# Patient Record
Sex: Female | Born: 1940 | Race: White | Hispanic: No | Marital: Married | Smoking: Former smoker
Health system: Southern US, Community
[De-identification: ages and names within clinical notes are randomized; demographics above are authoritative.]

## PROBLEM LIST (undated history)

## (undated) DIAGNOSIS — I639 Cerebral infarction, unspecified: Secondary | ICD-10-CM

## (undated) DIAGNOSIS — M79671 Pain in right foot: Secondary | ICD-10-CM

## (undated) DIAGNOSIS — C50919 Malignant neoplasm of unspecified site of unspecified female breast: Secondary | ICD-10-CM

## (undated) HISTORY — PX: CATARACT EXTRACTION: SUR2

## (undated) HISTORY — PX: BREAST BIOPSY: SHX20

## (undated) HISTORY — PX: EYE SURGERY: SHX253

## (undated) HISTORY — DX: Pain in right foot: M79.671

---

## 2007-05-10 ENCOUNTER — Ambulatory Visit: Payer: Self-pay | Admitting: Family Medicine

## 2010-09-25 ENCOUNTER — Ambulatory Visit (HOSPITAL_COMMUNITY): Admission: RE | Admit: 2010-09-25 | Discharge: 2010-09-25 | Payer: Self-pay | Admitting: Ophthalmology

## 2010-10-29 ENCOUNTER — Ambulatory Visit (HOSPITAL_COMMUNITY): Admission: RE | Admit: 2010-10-29 | Discharge: 2010-10-29 | Payer: Self-pay | Admitting: Ophthalmology

## 2011-02-18 LAB — BASIC METABOLIC PANEL
BUN: 9 mg/dL (ref 6–23)
Chloride: 99 mEq/L (ref 96–112)
Creatinine, Ser: 0.96 mg/dL (ref 0.4–1.2)
GFR calc Af Amer: >60 mL/min (ref >60)
GFR calc non Af Amer: 58 mL/min — ABNORMAL LOW (ref >60)
Potassium: 4.1 mEq/L (ref 3.5–5.1)

## 2011-02-18 LAB — HEMOGLOBIN AND HEMATOCRIT, BLOOD
HCT: 42.6 % (ref 36.0–46.0)
Hemoglobin: 14.7 g/dL (ref 12.0–15.0)

## 2013-10-07 DIAGNOSIS — M79671 Pain in right foot: Secondary | ICD-10-CM

## 2013-10-07 HISTORY — DX: Pain in right foot: M79.671

## 2013-11-06 ENCOUNTER — Other Ambulatory Visit: Payer: Self-pay | Admitting: Vascular Surgery

## 2013-11-06 DIAGNOSIS — M79609 Pain in unspecified limb: Secondary | ICD-10-CM

## 2013-11-07 ENCOUNTER — Encounter: Payer: Self-pay | Admitting: Vascular Surgery

## 2013-12-11 ENCOUNTER — Encounter: Payer: Self-pay | Admitting: Vascular Surgery

## 2013-12-12 ENCOUNTER — Encounter (HOSPITAL_COMMUNITY): Payer: Self-pay

## 2013-12-12 ENCOUNTER — Encounter: Payer: Self-pay | Admitting: Vascular Surgery

## 2014-01-02 ENCOUNTER — Encounter: Payer: Self-pay | Admitting: Vascular Surgery

## 2014-01-02 ENCOUNTER — Encounter (HOSPITAL_COMMUNITY): Payer: Self-pay

## 2015-05-22 ENCOUNTER — Other Ambulatory Visit (HOSPITAL_COMMUNITY): Payer: Self-pay | Admitting: Family Medicine

## 2015-05-22 ENCOUNTER — Ambulatory Visit (HOSPITAL_COMMUNITY)
Admission: RE | Admit: 2015-05-22 | Discharge: 2015-05-22 | Disposition: A | Discharge status: Home / Self Care | Payer: Medicare Other | Source: Ambulatory Visit | Attending: Vascular Surgery | Admitting: Vascular Surgery

## 2015-05-22 DIAGNOSIS — I749 Embolism and thrombosis of unspecified artery: Secondary | ICD-10-CM

## 2015-05-22 DIAGNOSIS — I7090 Unspecified atherosclerosis: Secondary | ICD-10-CM

## 2015-05-22 DIAGNOSIS — I70202 Unspecified atherosclerosis of native arteries of extremities, left leg: Secondary | ICD-10-CM | POA: Diagnosis not present

## 2015-09-06 ENCOUNTER — Encounter: Payer: Self-pay | Admitting: Vascular Surgery

## 2015-09-10 ENCOUNTER — Ambulatory Visit (INDEPENDENT_AMBULATORY_CARE_PROVIDER_SITE_OTHER): Payer: Medicare Other | Admitting: Vascular Surgery

## 2015-09-10 ENCOUNTER — Encounter: Payer: Self-pay | Admitting: Vascular Surgery

## 2015-09-10 VITALS — BP 131/67 | HR 65 | Ht 67.5 in | Wt 134.7 lb

## 2015-09-10 DIAGNOSIS — I739 Peripheral vascular disease, unspecified: Secondary | ICD-10-CM | POA: Diagnosis not present

## 2015-09-10 NOTE — Progress Notes (Signed)
Patient name: Jessica Harrell MRN: [id number] DOB: 1941/04/13 Sex: female   Reason for referral:  Chief Complaint  Patient presents with  . New Evaluation    eval pvd    HISTORY OF PRESENT ILLNESS: The patient is an active 74 year old female who presents today for evaluation of pain in her left foot. She initially thought that she may have gout. This was felt not to be the case. She had pain in her left second toe. Was treated with the Pletal and reports that this pain in her left second toe is completely resolved. She specifically denies any claudication type symptoms. Has no discomfort with walking. Has no history of lower extremity tissue loss. Denies cardiac or cerebrovascular symptoms. She does have a long smoking history.  Past Medical History  Diagnosis Date  . Right foot pain Nov. 2014    History reviewed. No pertinent past surgical history.  Social History   Social History  . Marital Status: Married    Spouse Name: N/A  . Number of Children: N/A  . Years of Education: N/A   Occupational History  . Not on file.   Social History Main Topics  . Smoking status: Current Every Day Smoker -- 1.00 packs/day for 58 years    Types: Cigarettes  . Smokeless tobacco: Never Used  . Alcohol Use: 4.2 oz/week    7 Cans of beer per week  . Drug Use: No  . Sexual Activity: Not on file   Other Topics Concern  . Not on file   Social History Narrative    Family History  Problem Relation Age of Onset  . Cancer Mother   . Hypertension Mother   . Varicose Veins Mother   . Cancer Sister     Allergies as of 09/10/2015  . (Not on File)    Current Outpatient Prescriptions on File Prior to Visit  Medication Sig Dispense Refill  . clopidogrel (PLAVIX) 75 MG tablet Take 75 mg by mouth daily with breakfast.     No current facility-administered medications on file prior to visit.     REVIEW OF SYSTEMS:  Positives indicated with an "X"  CARDIOVASCULAR:  [ ]  chest pain    [ ]  chest pressure   [ ]  palpitations   [ ]  orthopnea   [ ]  dyspnea on exertion   [ ]  claudication   [ ]  rest pain   [ ]  DVT   [ ]  phlebitis PULMONARY:   [ ]  productive cough   [ ]  asthma   [ ]  wheezing NEUROLOGIC:   [ ]  weakness  [ ]  paresthesias  [ ]  aphasia  [ ]  amaurosis  [ ]  dizziness HEMATOLOGIC:   [ ]  bleeding problems   [ ]  clotting disorders MUSCULOSKELETAL:  [ ]  joint pain   [ ]  joint swelling GASTROINTESTINAL: [ ]   blood in stool  [ ]   hematemesis GENITOURINARY:  [ ]   dysuria  [ ]   hematuria PSYCHIATRIC:  [ ]  history of major depression INTEGUMENTARY:  [ ]  rashes  [ ]  ulcers CONSTITUTIONAL:  [ ]  fever   [ ]  chills  PHYSICAL EXAMINATION:  General: The patient is a well-nourished female, in no acute distress. Vital signs are BP 131/67 mmHg  Pulse 65  Ht 5' 7.5" (1.715 m)  Wt 134 lb 11.2 oz (61.1 kg)  BMI 20.77 kg/m2  SpO2 95% Pulmonary: There is a good air exchange bilaterally without wheezing or rales. Abdomen: Soft and non-tender with normal  pitch bowel sounds. Musculoskeletal: There are no major deformities.  There is no significant extremity pain. Neurologic: No focal weakness or paresthesias are detected, Skin: There are no ulcer or rashes noted. Psychiatric: The patient has normal affect. Cardiovascular: There is a regular rate and rhythm without significant murmur appreciated. Pulse status: 2+ radial and 2+ femoral pulses. Absent popliteal and distal pulses bilaterally Carotid arteries without bruits bilaterally  [facility] Vascular Lab Studies:  Noninvasive studies from our office on 05/22/2015 revealed partially occlusive plaque in her left common and superficial femoral artery. She had triphasic waveforms at the common femoral level and monophasic at the SFA and distal level.  Impression and Plan:  Bilateral superficial femoral artery occlusive disease. She has no claudication symptoms and is completely resolved pain in her left foot. It is unclear as to whether or  not this was related to her trouble insufficiency. Certainly would not recommend any further follow-up since she is asymptomatic. Did review symptoms of arterial occlusive disease with patient and will notify should this occur. Otherwise we'll see her again on as-needed basis    Bryna Razavi [facility] Office: [phone]

## 2018-04-04 ENCOUNTER — Other Ambulatory Visit: Payer: Self-pay | Admitting: Family Medicine

## 2018-04-04 DIAGNOSIS — N6320 Unspecified lump in the left breast, unspecified quadrant: Secondary | ICD-10-CM

## 2018-04-07 ENCOUNTER — Ambulatory Visit
Admission: RE | Admit: 2018-04-07 | Discharge: 2018-04-07 | Disposition: A | Discharge status: Home / Self Care | Payer: Medicare Other | Source: Ambulatory Visit | Attending: Family Medicine | Admitting: Family Medicine

## 2018-04-07 ENCOUNTER — Other Ambulatory Visit: Payer: Self-pay | Admitting: Family Medicine

## 2018-04-07 DIAGNOSIS — N6320 Unspecified lump in the left breast, unspecified quadrant: Secondary | ICD-10-CM

## 2018-04-07 DIAGNOSIS — N6489 Other specified disorders of breast: Secondary | ICD-10-CM

## 2018-04-12 ENCOUNTER — Ambulatory Visit
Admission: RE | Admit: 2018-04-12 | Discharge: 2018-04-12 | Disposition: A | Discharge status: Home / Self Care | Payer: Medicare Other | Source: Ambulatory Visit | Attending: Family Medicine | Admitting: Family Medicine

## 2018-04-12 ENCOUNTER — Other Ambulatory Visit: Payer: Self-pay | Admitting: Family Medicine

## 2018-04-12 DIAGNOSIS — N6489 Other specified disorders of breast: Secondary | ICD-10-CM

## 2018-04-12 DIAGNOSIS — N6320 Unspecified lump in the left breast, unspecified quadrant: Secondary | ICD-10-CM

## 2018-04-18 ENCOUNTER — Other Ambulatory Visit: Payer: Self-pay | Admitting: Surgery

## 2018-04-19 ENCOUNTER — Encounter: Payer: Self-pay | Admitting: Genetics

## 2018-04-19 ENCOUNTER — Encounter: Payer: Self-pay | Admitting: Hematology and Oncology

## 2018-04-19 ENCOUNTER — Other Ambulatory Visit: Payer: Self-pay | Admitting: Surgery

## 2018-04-19 ENCOUNTER — Telehealth: Payer: Self-pay | Admitting: Hematology and Oncology

## 2018-04-19 DIAGNOSIS — C50912 Malignant neoplasm of unspecified site of left female breast: Secondary | ICD-10-CM

## 2018-04-19 NOTE — Telephone Encounter (Signed)
Referral received from Dr. Ninfa Linden at [facility] for breast cancer. Pt has been scheduled to see Dr. Lindi Adie on 3/21 at 345pm. Pt aware to arrive 30 minutes early. Letter mailed.

## 2018-04-20 ENCOUNTER — Encounter: Payer: Self-pay | Admitting: Radiation Oncology

## 2018-04-20 NOTE — Progress Notes (Signed)
Location of Breast Cancer: Left Breast  Histology per Pathology Report:  04/12/18 Diagnosis 1. Breast, left, needle core biopsy, 8 o'clock, [place] - INVASIVE DUCTAL CARCINOMA WITH EXTRACELLULAR MUCIN. 2. Breast, left, needle core biopsy, lateral calcs, stereo - COMPLEX SCLEROSING LESION WITH USUAL DUCTAL HYPERPLASIA AND CALCIFICATION. - FIBROCYSTIC CHANGES WITH USUAL DUCTAL HYPERPLASIA. - NO MALIGNANCY IDENTIFIED.  Receptor Status: ER(100%), PR (5%), Her2-neu (NEG), Ki-(15%)  Did patient present with symptoms or was this found on screening mammography?: She felt the mass in her left breast approximately 6 weeks ago. She has never had a mammogram.   Past/Anticipated interventions by surgeon, if any: Dr. Ninfa Linden  Past/Anticipated interventions by medical oncology, if any:  Dr. Lindi Adie 04/26/18  Lymphedema issues, if any: N/A  Pain issues, if any:  Reports bruising and tenderness at biopsy site  SAFETY ISSUES:  Prior radiation? no  Pacemaker/ICD? No  Possible current pregnancy? No  Is the patient on methotrexate? No  Current Complaints / other details:   04/27/18 Bilateral Breast MRI    Malmfelt, Stephani Police, RN 04/20/2018,2:19 PM

## 2018-04-26 ENCOUNTER — Telehealth: Payer: Self-pay | Admitting: Hematology and Oncology

## 2018-04-26 ENCOUNTER — Inpatient Hospital Stay: Payer: Medicare Other | Attending: Hematology and Oncology | Admitting: Hematology and Oncology

## 2018-04-26 ENCOUNTER — Ambulatory Visit
Admission: RE | Admit: 2018-04-26 | Discharge: 2018-04-26 | Disposition: A | Discharge status: Home / Self Care | Payer: Medicare Other | Source: Ambulatory Visit | Attending: Radiation Oncology | Admitting: Radiation Oncology

## 2018-04-26 ENCOUNTER — Encounter: Payer: Self-pay | Admitting: Radiation Oncology

## 2018-04-26 ENCOUNTER — Other Ambulatory Visit: Payer: Self-pay

## 2018-04-26 VITALS — BP 190/74 | HR 64 | Temp 97.6°F | Resp 18 | Wt 143.6 lb

## 2018-04-26 DIAGNOSIS — Z79899 Other long term (current) drug therapy: Secondary | ICD-10-CM | POA: Insufficient documentation

## 2018-04-26 DIAGNOSIS — C50312 Malignant neoplasm of lower-inner quadrant of left female breast: Secondary | ICD-10-CM

## 2018-04-26 DIAGNOSIS — I10 Essential (primary) hypertension: Secondary | ICD-10-CM | POA: Diagnosis not present

## 2018-04-26 DIAGNOSIS — Z809 Family history of malignant neoplasm, unspecified: Secondary | ICD-10-CM | POA: Diagnosis not present

## 2018-04-26 DIAGNOSIS — Z170 Estrogen receptor positive status [ER+]: Secondary | ICD-10-CM | POA: Insufficient documentation

## 2018-04-26 DIAGNOSIS — Z7982 Long term (current) use of aspirin: Secondary | ICD-10-CM | POA: Insufficient documentation

## 2018-04-26 DIAGNOSIS — F17210 Nicotine dependence, cigarettes, uncomplicated: Secondary | ICD-10-CM | POA: Insufficient documentation

## 2018-04-26 DIAGNOSIS — Z79811 Long term (current) use of aromatase inhibitors: Secondary | ICD-10-CM | POA: Insufficient documentation

## 2018-04-26 DIAGNOSIS — Z800 Family history of malignant neoplasm of digestive organs: Secondary | ICD-10-CM | POA: Diagnosis not present

## 2018-04-26 HISTORY — DX: Malignant neoplasm of unspecified site of unspecified female breast: C50.919

## 2018-04-26 MED ORDER — LETROZOLE 2.5 MG PO TABS: 2.5000 mg | ORAL_TABLET | Freq: Every day | ORAL | 3 refills | Status: DC

## 2018-04-26 NOTE — Progress Notes (Signed)
See progress note under physician encounter. 

## 2018-04-26 NOTE — Progress Notes (Signed)
[facility] CONSULT NOTE  Patient Care Team: [name], MD as PCP - General (Family Medicine)  CHIEF COMPLAINTS/PURPOSE OF CONSULTATION:  Newly diagnosed breast cancer  HISTORY OF PRESENTING ILLNESS:  Jessica Harrell 77 y.o. female is here because of recent diagnosis of Left breast cancer.  Patient had palpated a lump in the left breast for a few weeks had reported to her husband.  She then underwent a mammogram and ultrasound and a biopsy which showed that she had invasive ductal carcinoma.  She had an area of asymmetry and distortion which on biopsy came back as complex sclerosing lesion.     I reviewed her records extensively and collaborated the history with the patient.  SUMMARY OF ONCOLOGIC HISTORY:   Malignant neoplasm of lower-inner quadrant of left breast in female, estrogen receptor positive ([place])   04/12/2018 Initial Diagnosis    Palpable mass lower inner quadrant left breast approximately measures 6 cm, 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, ER 100%, PR 5%, HER-2 negative ratio 1.23, Ki-67 15% 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; stage II a clinical stage AJCC 8      MEDICAL HISTORY:  Past Medical History:  Diagnosis Date  . Breast cancer ([place])   . Right foot pain Nov. 2014    SURGICAL HISTORY: Past Surgical History:  Procedure Laterality Date  . BREAST BIOPSY    . CATARACT EXTRACTION     both eyes  . EYE SURGERY Left     SOCIAL HISTORY: Social History   Socioeconomic History  . Marital status: Married    Spouse name: Not on file  . Number of children: Not on file  . Years of education: Not on file  . Highest education level: Not on file  Occupational History    Comment: retired  Scientific laboratory technician  . Financial resource strain: Not on file  . Food insecurity:    Worry: Not on file    Inability: Not on file  . Transportation needs:    Medical: Not on file    Non-medical: Not on file  Tobacco Use   . Smoking status: Current Every Day Smoker    Packs/day: 0.50    Years: 58.00    Pack years: 29.00    Types: Cigarettes  . Smokeless tobacco: Never Used  Substance and Sexual Activity  . Alcohol use: Yes    Alcohol/week: 4.2 oz    Types: 7 Cans of beer per week  . Drug use: No  . Sexual activity: Not Currently  Lifestyle  . Physical activity:    Days per week: Not on file    Minutes per session: Not on file  . Stress: Not on file  Relationships  . Social connections:    Talks on phone: Not on file    Gets together: Not on file    Attends religious service: Not on file    Active member of club or organization: Not on file    Attends meetings of clubs or organizations: Not on file    Relationship status: Not on file  . Intimate partner violence:    Fear of current or ex partner: Not on file    Emotionally abused: Not on file    Physically abused: Not on file    Forced sexual activity: Not on file  Other Topics Concern  . Not on file  Social History Narrative   Resides in [place]. Has one grown daughter. No grandchildren. No pets.  FAMILY HISTORY: Family History  Problem Relation Age of Onset  . Cancer Mother        esophageal  . Hypertension Mother   . Varicose Veins Mother   . Cancer Sister        non hodgkins  . Cancer Other        unknown    ALLERGIES:  has No Known Allergies.  MEDICATIONS:  Current Outpatient Medications  Medication Sig Dispense Refill  . aspirin EC 81 MG tablet Take 81 mg by mouth daily.    . cholecalciferol (VITAMIN D) 1000 units tablet Take 1,000 Units by mouth daily.    Marland Kitchen letrozole (FEMARA) 2.5 MG tablet Take 1 tablet (2.5 mg total) by mouth daily. 90 tablet 3  . Red Yeast Rice Extract (RED YEAST RICE PO) Take by mouth.     No current facility-administered medications for this visit.     REVIEW OF SYSTEMS:   Constitutional: Denies fevers, chills or abnormal night sweats Eyes: Denies blurriness of vision, double vision or  watery eyes Ears, nose, mouth, throat, and face: Denies mucositis or sore throat Respiratory: Denies cough, dyspnea or wheezes Cardiovascular: Denies palpitation, chest discomfort or lower extremity swelling Gastrointestinal:  Denies nausea, heartburn or change in bowel habits Skin: Denies abnormal skin rashes Lymphatics: Denies new lymphadenopathy or easy bruising Neurological:Denies numbness, tingling or new weaknesses Behavioral/Psych: Mood is stable, no new changes  Breast: Large palpable mass in the left breast lower inner quadrant All other systems were reviewed with the patient and are negative.  PHYSICAL EXAMINATION: ECOG PERFORMANCE STATUS: 1 - Symptomatic but completely ambulatory  Vitals:   04/26/18 1558  BP: (!) 152/75  Pulse: 66  Resp: 17  Temp: 98.1 F (36.7 C)  SpO2: 98%   Filed Weights   04/26/18 1558  Weight: 144 lb 14.4 oz (65.7 kg)    GENERAL:alert, no distress and comfortable SKIN: skin color, texture, turgor are normal, no rashes or significant lesions EYES: normal, conjunctiva are pink and non-injected, sclera clear OROPHARYNX:no exudate, no erythema and lips, buccal mucosa, and tongue normal  NECK: supple, thyroid normal size, non-tender, without nodularity LYMPH:  no palpable lymphadenopathy in the cervical, axillary or inguinal LUNGS: clear to auscultation and percussion with normal breathing effort HEART: regular rate & rhythm and no murmurs and no lower extremity edema ABDOMEN:abdomen soft, non-tender and normal bowel sounds Musculoskeletal:no cyanosis of digits and no clubbing  PSYCH: alert & oriented x 3 with fluent speech NEURO: no focal motor/sensory deficits BREAST: Very large left breast mass lower inner quadrant. No palpable axillary or supraclavicular lymphadenopathy (exam performed in the presence of a chaperone)   LABORATORY DATA:  I have reviewed the data as listed Lab Results  Component Value Date   HGB 14.7 09/22/2010   HCT 42.6  09/22/2010   Lab Results  Component Value Date   NA 133 (L) 09/22/2010   K 4.1 09/22/2010   CL 99 09/22/2010   CO2 28 09/22/2010    RADIOGRAPHIC STUDIES: I have personally reviewed the radiological reports and agreed with the findings in the report.  ASSESSMENT AND PLAN:  Malignant neoplasm of lower-inner quadrant of left breast in female, estrogen receptor positive ([facility]) 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; stage II a clinical stage AJCC 8  Pathology and radiology counseling: Discussed with the  patient, the details of pathology including the type of breast cancer,the clinical staging, the significance of ER, PR and HER-2/neu receptors and the implications for treatment. After reviewing the pathology in detail, we proceeded to discuss the different treatment options between surgery, radiation, chemotherapy, antiestrogen therapies.  Recommendation: 1.  Neoadjuvant antiestrogen therapy with letrozole 2.5 mg daily for 6 months 2. breast MRI now as well as after completion of neoadjuvant antiestrogen's  3.  If the tumor does not respond to neoadjuvant antiestrogen therapy, then we can switch her to chemotherapy. 4.  Followed by mastectomy most likely given the size of the tumor versus lumpectomy 5.  Adjuvant radiation therapy  6.  Adjuvant antiestrogen therapy with letrozole x7 years   Return to clinic in 1 month for toxicity check and follow-up     All questions were answered. The patient knows to call the clinic with any problems, questions or concerns.    Harriette Ohara, MD 04/26/18

## 2018-04-26 NOTE — Assessment & Plan Note (Addendum)
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; stage II a clinical stage AJCC 8  Pathology and radiology counseling: Discussed with the patient, the details of pathology including the type of breast cancer,the clinical staging, the significance of ER, PR and HER-2/neu receptors and the implications for treatment. After reviewing the pathology in detail, we proceeded to discuss the different treatment options between surgery, radiation, chemotherapy, antiestrogen therapies.  Recommendation: 1.  Neoadjuvant antiestrogen therapy with letrozole 2.5 mg daily for 6 months 2. breast MRI now as well as after completion of neoadjuvant antiestrogen's  3.  If the tumor does not respond to neoadjuvant antiestrogen therapy, then we can switch her to chemotherapy. 4.  Followed by mastectomy most likely given the size of the tumor versus lumpectomy 5.  Adjuvant radiation therapy  6.  Adjuvant antiestrogen therapy with letrozole x7 years   Return to clinic in 1 month for toxicity check and follow-up

## 2018-04-26 NOTE — Telephone Encounter (Signed)
Appointments scheduled AVS/Calendar printed per 5/21 los °

## 2018-04-26 NOTE — Progress Notes (Signed)
Radiation Oncology         [phone] ________________________________  Initial Outpatient Consultation  Name: Jessica Harrell MRN: [id number]  Date: 04/26/2018  DOB: 1941-02-20  CC:[name], MD  Coralie Keens, MD   REFERRING PHYSICIAN: Coralie Keens, MD  DIAGNOSIS:    ICD-10-CM   1. Malignant neoplasm of lower-inner quadrant of left breast in female, estrogen receptor positive ([place]) C50.312    Z17.0    Stage T3N0M0 (pending MRI staging) Left Breast LIQ Invasive Ductal Carcinoma, ER+ / PR+ / Her2-, Grade I-II  Cancer Staging Malignant neoplasm of lower-inner quadrant of left breast in female, estrogen receptor positive ([place]) Staging form: Breast, AJCC 8th Edition - Clinical: Stage IIA (cT3, cN0, cM0, G2, ER+, PR+, HER2-) - Signed by Eppie Gibson, MD on 04/27/2018    CHIEF COMPLAINT: Here to discuss management of left breast cancer  HISTORY OF PRESENT ILLNESS::Jessica Harrell is a 77 y.o. female who self palpated a mass in the left breast approximately 6 weeks ago. Diagnostic mammogram on 04/07/18 showed highly suspicious 6-8 cm mass involving the lower inner quadrant of the left breast with associated skin involvement. The size of the mass is likely underestimated on ultrasound. Indeterminate architectural distortion associated with microcalcifications in the lower outer quadrant of the left breast at middle depth without sonographic correlate. Benign 6 cm lipoma in the upper outer quadrant of the left breast. No pathologic left axillary lymphadenopathy. No mammographic evidence of malignancy involving the right breast. The patient has no prior mammograms Biopsy of the 8 o'clock position on 04/12/18 showed invasive ductal carcinoma with extracellular mucin with characteristics as described above in the diagnosis. Biopsy of the lateral calcificaions showed complex sclerosing lesion with usual ductal hyperplasia and calcification but no malignancy identified. Receptor status is  ER 100%, PR 5%, Her2 negative, and Ki-67 15%.  The patient presents today complaining of bruising and tenderness to the biopsy site. She denies any new chest pains, bladder/bowel problems, skin changes, or palpable masses in any other areas of the body. The patient presents with her husband and her daughter to discuss the role of radiation therapy as part of her disease management.  PREVIOUS RADIATION THERAPY: No  PAST MEDICAL HISTORY:  has a past medical history of Breast cancer ([place]) and Right foot pain (Nov. 2014).    PAST SURGICAL HISTORY: Past Surgical History:  Procedure Laterality Date  . BREAST BIOPSY    . CATARACT EXTRACTION     both eyes  . EYE SURGERY Left     FAMILY HISTORY: family history includes Cancer in her mother, other, and sister; Hypertension in her mother; Varicose Veins in her mother.  SOCIAL HISTORY:  reports that she has been smoking cigarettes.  She has a 29.00 pack-year smoking history. She has never used smokeless tobacco. She reports that she drinks about 4.2 oz of alcohol per week. She reports that she does not use drugs.  ALLERGIES: Patient has no known allergies.  MEDICATIONS:  Current Outpatient Medications  Medication Sig Dispense Refill  . aspirin EC 81 MG tablet Take 81 mg by mouth daily.    . cholecalciferol (VITAMIN D) 1000 units tablet Take 1,000 Units by mouth daily.    . Red Yeast Rice Extract (RED YEAST RICE PO) Take by mouth.    . letrozole (FEMARA) 2.5 MG tablet Take 1 tablet (2.5 mg total) by mouth daily. 90 tablet 3   No current facility-administered medications for this encounter.     REVIEW OF  SYSTEMS: A 10+ POINT REVIEW OF SYSTEMS WAS OBTAINED including neurology, dermatology, psychiatry, cardiac, respiratory, lymph, extremities, GI, GU, Musculoskeletal, constitutional, breasts, HEENT.  All pertinent positives are noted in the HPI.  All others are negative.   PHYSICAL EXAM:  weight is 143 lb 9.6 oz (65.1 kg). Her oral temperature is  97.6 F (36.4 C). Her blood pressure is 190/74 (abnormal) and her pulse is 64. Her respiration is 18 and oxygen saturation is 96%.   General: Alert and oriented, in no acute distress HEENT: Head is normocephalic. Extraocular movements are intact. Oropharynx is clear. Neck: Neck is supple, no palpable cervical or supraclavicular lymphadenopathy. Heart: Regular in rate and rhythm with no murmurs, rubs, or gallops. Chest: Clear to auscultation bilaterally, with no rhonchi, wheezes, or rales. Abdomen: Soft, nontender, nondistended, with no rigidity or guarding. Extremities: No cyanosis or edema. Lymphatics: see Neck Exam Skin: No concerning lesions. Musculoskeletal: symmetric strength and muscle tone throughout. Neurologic: Cranial nerves II through XII are grossly intact. No obvious focalities. Speech is fluent. Coordination is intact. Psychiatric: Judgment and insight are intact. Affect is appropriate.  Breasts: Lower inner quadrant left breast mass which is approximately 6 cm in greatest dimension. On palpation it appears somewhat fixed. There is no erosion through the skin. In the upper outer quadrant of the left breast there is a soft, mobile mass that is also approximately 6 cm in dimension. This is consistent with what may be the lipoma found on imaging. No palpable axillary lymphadenopathy. No other palpable masses appreciated in the breasts or axillae.   ECOG = 1  0 - Asymptomatic (Fully active, able to carry on all predisease activities without restriction)  1 - Symptomatic but completely ambulatory (Restricted in physically strenuous activity but ambulatory and able to carry out work of a light or sedentary nature. For example, light housework, office work)  2 - Symptomatic, <50% in bed during the day (Ambulatory and capable of all self care but unable to carry out any work activities. Up and about more than 50% of waking hours)  3 - Symptomatic, >50% in bed, but not bedbound (Capable  of only limited self-care, confined to bed or chair 50% or more of waking hours)  4 - Bedbound (Completely disabled. Cannot carry on any self-care. Totally confined to bed or chair)  5 - Death   Eustace Pen MM, Creech RH, Tormey DC, et al. [phone]). "Toxicity and response criteria of the [facility]". [place] Oncol. 5 (6): 649-55   LABORATORY DATA:  Lab Results  Component Value Date   HGB 14.7 09/22/2010   HCT 42.6 09/22/2010   CMP     Component Value Date/Time   NA 133 (L) 09/22/2010 0917   K 4.1 09/22/2010 0917   CL 99 09/22/2010 0917   CO2 28 09/22/2010 0917   GLUCOSE 88 09/22/2010 0917   BUN 9 09/22/2010 0917   CREATININE 0.96 09/22/2010 0917   CALCIUM 9.3 09/22/2010 0917   GFRNONAA 58 (L) 09/22/2010 0917   GFRAA  09/22/2010 0917    >60        The eGFR has been calculated using the MDRD equation. This calculation has not been validated in all clinical situations. eGFR's persistently <60 mL/min signify possible Chronic Kidney Disease.         RADIOGRAPHY: US Breast Ltd Uni Left Inc Axilla  Result Date: 04/07/2018 CLINICAL DATA:  77 year old presenting with a painless palpable lump involving the LOWER INNER LEFT breast which the patient  states she initially noticed approximately 4-5 weeks ago. Annual evaluation, RIGHT breast. This is the patient's initial baseline mammogram. EXAM: DIGITAL DIAGNOSTIC BILATERAL MAMMOGRAM WITH CAD AND TOMO ULTRASOUND LEFT BREAST COMPARISON:  None. ACR Breast Density Category b: There are scattered areas of fibroglandular density. FINDINGS: Tomosynthesis and synthesized full field CC and MLO views of both breasts were obtained. Tomosynthesis and synthesized spot compression tangential view of the area of concern in the LEFT breast and tomosynthesis and synthesized spot-compression CC and MLO views of a separate mammographic finding in the LEFT breast were obtained. Corresponding to the palpable concern is a large mass which  is incompletely imaged on mammography, measuring at least 6 cm, with apparent extension to the skin surface and slight dimpling of the skin. There is a focus of architectural distortion associated with a focal asymmetry and microcalcifications in the LOWER OUTER quadrant at MIDDLE depth which persists on the spot compression images. I do not identify an associated mass in this location. A solitary lymph node is identified in the low LEFT axilla on the MLO view. No findings suspicious for malignancy in the RIGHT breast. Mammographic images were processed with CAD. On physical exam, there is a palpable approximate 6-8 cm mass involving the LOWER INNER QUADRANT of the LEFT breast with discoloration of the overlying skin. There is a an approximate 6 cm fluctuant soft mass in the UPPER OUTER QUADRANT of the LEFT breast. Targeted LEFT breast ultrasound is performed, showing the large mass involving the LOWER INNER QUADRANT centered at the 8 o'clock location, heterogeneous in echotexture, extending up to the skin surface. Due to its large size, accurate measurements are difficult, though it is on the order of at least 6 cm. There are mixed posterior characteristics and there is internal power Doppler flow. An incidental circumscribed lipoma is identified at the 2 o'clock position approximately 4 cm from the nipple, visible in retrospect on the mammogram. The lipoma measures approximately 2.1 x 5.3 x 6.3 cm. There is no sonographic correlate for the architectural distortion in the LOWER OUTER QUADRANT which is located at the inferior and anterior margin of the lipoma. An incidental 3 mm cyst is identified at the 3 o'clock position approximately 3 cm from the nipple. Sonographic evaluation of the LEFT axilla demonstrates no pathologic lymphadenopathy. The lymph node visible on the MLO mammogram in the low LEFT axilla is normal in appearance. IMPRESSION: 1. Highly suspicious large mass involving the LOWER INNER QUADRANT of  the LEFT breast with associated skin involvement. The size of the mass is likely underestimated on ultrasound. The maximum sonographic measurement is approximately 6 cm. 2. Indeterminate architectural distortion associated with microcalcifications in the LOWER OUTER QUADRANT of the LEFT breast at MIDDLE depth without sonographic correlate. 3. Benign 6 cm lipoma in the UPPER OUTER QUADRANT of the LEFT breast. 4. No pathologic LEFT axillary lymphadenopathy. 5. No mammographic evidence of malignancy involving the RIGHT breast. RECOMMENDATION: 1. Ultrasound-guided core needle biopsy of the large mass involving the LOWER INNER QUADRANT of the LEFT breast. 2. Stereotactic tomosynthesis core needle biopsy of the architectural distortion involving the LOWER OUTER QUADRANT of the LEFT breast. The biopsy procedures were discussed with the patient and her questions were answered. She has agreed to proceed and the procedures have been scheduled for Tuesday, May 7 at 7:30 a.m. and will be performed consecutively. I have discussed the findings and recommendations with the patient. Results were also provided in writing at the conclusion of the visit. BI-RADS CATEGORY  5: Highly suggestive of malignancy. Electronically Signed   By: Evangeline Dakin M.D.   On: 04/07/2018 15:57   Mm Diag Breast Tomo Bilateral  Result Date: 04/07/2018 CLINICAL DATA:  77 year old presenting with a painless palpable lump involving the LOWER INNER LEFT breast which the patient states she initially noticed approximately 4-5 weeks ago. Annual evaluation, RIGHT breast. This is the patient's initial baseline mammogram. EXAM: DIGITAL DIAGNOSTIC BILATERAL MAMMOGRAM WITH CAD AND TOMO ULTRASOUND LEFT BREAST COMPARISON:  None. ACR Breast Density Category b: There are scattered areas of fibroglandular density. FINDINGS: Tomosynthesis and synthesized full field CC and MLO views of both breasts were obtained. Tomosynthesis and synthesized spot compression  tangential view of the area of concern in the LEFT breast and tomosynthesis and synthesized spot-compression CC and MLO views of a separate mammographic finding in the LEFT breast were obtained. Corresponding to the palpable concern is a large mass which is incompletely imaged on mammography, measuring at least 6 cm, with apparent extension to the skin surface and slight dimpling of the skin. There is a focus of architectural distortion associated with a focal asymmetry and microcalcifications in the LOWER OUTER quadrant at MIDDLE depth which persists on the spot compression images. I do not identify an associated mass in this location. A solitary lymph node is identified in the low LEFT axilla on the MLO view. No findings suspicious for malignancy in the RIGHT breast. Mammographic images were processed with CAD. On physical exam, there is a palpable approximate 6-8 cm mass involving the LOWER INNER QUADRANT of the LEFT breast with discoloration of the overlying skin. There is a an approximate 6 cm fluctuant soft mass in the UPPER OUTER QUADRANT of the LEFT breast. Targeted LEFT breast ultrasound is performed, showing the large mass involving the LOWER INNER QUADRANT centered at the 8 o'clock location, heterogeneous in echotexture, extending up to the skin surface. Due to its large size, accurate measurements are difficult, though it is on the order of at least 6 cm. There are mixed posterior characteristics and there is internal power Doppler flow. An incidental circumscribed lipoma is identified at the 2 o'clock position approximately 4 cm from the nipple, visible in retrospect on the mammogram. The lipoma measures approximately 2.1 x 5.3 x 6.3 cm. There is no sonographic correlate for the architectural distortion in the LOWER OUTER QUADRANT which is located at the inferior and anterior margin of the lipoma. An incidental 3 mm cyst is identified at the 3 o'clock position approximately 3 cm from the nipple.  Sonographic evaluation of the LEFT axilla demonstrates no pathologic lymphadenopathy. The lymph node visible on the MLO mammogram in the low LEFT axilla is normal in appearance. IMPRESSION: 1. Highly suspicious large mass involving the LOWER INNER QUADRANT of the LEFT breast with associated skin involvement. The size of the mass is likely underestimated on ultrasound. The maximum sonographic measurement is approximately 6 cm. 2. Indeterminate architectural distortion associated with microcalcifications in the LOWER OUTER QUADRANT of the LEFT breast at MIDDLE depth without sonographic correlate. 3. Benign 6 cm lipoma in the UPPER OUTER QUADRANT of the LEFT breast. 4. No pathologic LEFT axillary lymphadenopathy. 5. No mammographic evidence of malignancy involving the RIGHT breast. RECOMMENDATION: 1. Ultrasound-guided core needle biopsy of the large mass involving the LOWER INNER QUADRANT of the LEFT breast. 2. Stereotactic tomosynthesis core needle biopsy of the architectural distortion involving the LOWER OUTER QUADRANT of the LEFT breast. The biopsy procedures were discussed with the patient and her questions were  answered. She has agreed to proceed and the procedures have been scheduled for Tuesday, May 7 at 7:30 a.m. and will be performed consecutively. I have discussed the findings and recommendations with the patient. Results were also provided in writing at the conclusion of the visit. BI-RADS CATEGORY  5: Highly suggestive of malignancy. Electronically Signed   By: Evangeline Dakin M.D.   On: 04/07/2018 15:57   Mm Clip Placement Left  Result Date: 04/12/2018 CLINICAL DATA:  Post biopsy mammogram of the left breast for clip placement. EXAM: DIAGNOSTIC LEFT MAMMOGRAM POST ULTRASOUND AND STEREOTACTIC BIOPSY COMPARISON:  Previous exam(s). FINDINGS: Mammographic images were obtained following ultrasound guided biopsy of of a mass in the lower-inner left breast at 8 o'clock, and stereotactic guided biopsy of an  area of distortion and calcifications in the lateral left breast. The ribbon shaped biopsy marking clip is well positioned at the site of the biopsied mass in the lower-inner left breast. The coil shaped biopsy marking clip is positioned at the site of distortion in the slightly upper outer left breast. IMPRESSION: 1. The ribbon shaped biopsy marking clip is well positioned within the mass at 8 o'clock in the left breast. 2. The coil shaped biopsy marking clip is well positioned at the site of biopsied distortion in the slightly upper outer left breast. Final Assessment: Post Procedure Mammograms for Marker Placement Electronically Signed   By: Ammie Ferrier M.D.   On: 04/12/2018 08:59   Mm Lt Breast Bx W Loc Dev 1st Lesion Image Bx Spec Stereo Guide  Addendum Date: 04/18/2018   ADDENDUM REPORT: 04/13/2018 14:21 ADDENDUM: Pathology revealed GRADE I-II INVASIVE DUCTAL CARCINOMA WITH EXTRACELLULAR MUCIN of the Left breast, 8 o'clock. This was found to be concordant by Dr. Ammie Ferrier. Pathology revealed COMPLEX SCLEROSING LESION WITH USUAL DUCTAL HYPERPLASIA AND CALCIFICATION, FIBROCYSTIC CHANGES WITH USUAL DUCTAL HYPERPLASIA of the Left breast, lateral calcs. This was found to be concordant by Dr. Ammie Ferrier, with excision recommended. Pathology results were discussed with the patient by telephone. The patient reported doing well after the biopsies with tenderness at the sites. Post biopsy instructions and care were reviewed and questions were answered. The patient was encouraged to call [facility] for any additional concerns. Surgical consultation has been arranged with Dr. Nedra Hai at [facility], per patient request, on Apr 18, 2018. Pathology results reported by Terie Purser, RN on 04/13/2018. Electronically Signed   By: Ammie Ferrier M.D.   On: 04/13/2018 14:21   Result Date: 04/18/2018 CLINICAL DATA:  77 year old female presenting for  stereotactic biopsy of distortion with calcifications in the lateral left breast. EXAM: LEFT BREAST STEREOTACTIC CORE NEEDLE BIOPSY COMPARISON:  Previous exams. FINDINGS: The patient and I discussed the procedure of stereotactic-guided biopsy including benefits and alternatives. We discussed the high likelihood of a successful procedure. We discussed the risks of the procedure including infection, bleeding, tissue injury, clip migration, and inadequate sampling. Informed written consent was given. The usual time out protocol was performed immediately prior to the procedure. Using sterile technique and 1% Lidocaine as local anesthetic, under stereotactic guidance, a 9 gauge vacuum assisted device was used to perform core needle biopsy of calcifications in the slightly upper outer left breast using a lateral approach. Specimen radiograph was performed showing faint calcifications within a few core samples. Specimens with calcifications are identified for pathology. Lesion quadrant: Upper outer quadrant At the conclusion of the procedure, a coil shaped tissue marker clip was deployed into  the biopsy cavity. Follow-up 2-view mammogram was performed and dictated separately. IMPRESSION: Stereotactic-guided biopsy of distortion with calcifications in the slightly upper outer left breast. No apparent complications. Electronically Signed: By: Ammie Ferrier M.D. On: 04/12/2018 08:57   [place] Lt Breast Bx W Loc Dev 1st Lesion Img Bx Spec US Guide  Addendum Date: 04/18/2018   ADDENDUM REPORT: 04/13/2018 14:21 ADDENDUM: Pathology revealed GRADE I-II INVASIVE DUCTAL CARCINOMA WITH EXTRACELLULAR MUCIN of the Left breast, 8 o'clock. This was found to be concordant by Dr. Ammie Ferrier. Pathology revealed COMPLEX SCLEROSING LESION WITH USUAL DUCTAL HYPERPLASIA AND CALCIFICATION, FIBROCYSTIC CHANGES WITH USUAL DUCTAL HYPERPLASIA of the Left breast, lateral calcs. This was found to be concordant by Dr. Ammie Ferrier, with  excision recommended. Pathology results were discussed with the patient by telephone. The patient reported doing well after the biopsies with tenderness at the sites. Post biopsy instructions and care were reviewed and questions were answered. The patient was encouraged to call [facility] for any additional concerns. Surgical consultation has been arranged with Dr. Nedra Hai at [facility], per patient request, on Apr 18, 2018. Pathology results reported by Terie Purser, RN on 04/13/2018. Electronically Signed   By: Ammie Ferrier M.D.   On: 04/13/2018 14:21   Result Date: 04/18/2018 CLINICAL DATA:  77 year old female presenting for ultrasound-guided biopsy of a palpable mass in the lower-inner left breast. EXAM: ULTRASOUND GUIDED LEFT BREAST CORE NEEDLE BIOPSY COMPARISON:  Previous exam(s). FINDINGS: I met with the patient and we discussed the procedure of ultrasound-guided biopsy, including benefits and alternatives. We discussed the high likelihood of a successful procedure. We discussed the risks of the procedure, including infection, bleeding, tissue injury, clip migration, and inadequate sampling. Informed written consent was given. The usual time-out protocol was performed immediately prior to the procedure. Lesion quadrant: Lower-inner quadrant Using sterile technique and 1% Lidocaine as local anesthetic, under direct ultrasound visualization, a 14 gauge spring-loaded device was used to perform biopsy of a mass in the lower inner quadrant of the left breast at 8 o'clock using a superolateral approach. At the conclusion of the procedure a ribbon shaped tissue marker clip was deployed into the biopsy cavity. Follow up 2 view mammogram was performed and dictated separately. IMPRESSION: Ultrasound guided biopsy of left breast mass at 8 o'clock. No apparent complications. Electronically Signed: By: Ammie Ferrier M.D. On: 04/12/2018 08:59        IMPRESSION/PLAN: Stage IIA Left Breast LIQ Invasive Ductal Carcinoma, ER+ / PR+ / Her2-, Grade I-II  The patient will meet with Dr. Lindi Adie to discuss the role of chemotherapy and antiestrogens later today. She is scheduled to undergo bilateral breast MRI tomorrow.   It was a pleasure meeting the patient today. We discussed the risks, benefits, and side effects of radiotherapy. The patient's case will be discussed further to determine the best course of treatment, particularly re: her surgical treatment, which will depend upon MRI results.   We discussed the guidelines associated with post-mastectomy and post-lumpectomy radiation. Given the size of the mass, it is likely the patient will proceed with radiation regardless if she proceeds with left lumpectomy or mastectomy.  I will likely recommend radiotherapy to the left breast to reduce her risk of locoregional recurrence by 2/3. We discussed that radiation would take approximately 4-6 weeks to complete and that I would give the patient a few weeks to heal following surgery before starting treatment planning. If chemotherapy were to be given, this would  precede radiotherapy. We spoke about acute effects including skin irritation and fatigue as well as much less common late effects including internal organ injury or irritation. We spoke about the latest technology that is used to minimize the risk of late effects for patients undergoing radiotherapy to the breast or chest wall. No guarantees of treatment were given. The patient is enthusiastic about proceeding with treatment. A consent form was signed and a copy was placed in the patient's medical record. I look forward to participating in the patient's care.  I will await her referral back to me for postoperative follow-up and eventual CT simulation/treatment planning.  I asked the patient today about tobacco use. The patient uses tobacco.  I advised the patient to quit. Services were offered by me today  to aid with cessation. I assessed for the willingness to attempt to quit and provided encouragement and demonstrated willingness to make referrals and/or prescriptions to help the patient attempt to quit. The patient has follow-up with the oncologic team to touch base on their tobacco use and /or cessation efforts.  Over 10 minutes were spent on this issue.  The patient set a quit date for 06/06/18. She will start with the [facility] counseling.  Referral faxed today to the [facility].  __________________________________________   Eppie Gibson, MD   This document serves as a record of services personally performed by Eppie Gibson, MD. It was created on her behalf by Bethann Humble, a trained medical scribe. The creation of this record is based on the scribe's personal observations and the provider's statements to them. This document has been checked and approved by the attending provider.

## 2018-04-27 ENCOUNTER — Ambulatory Visit
Admission: RE | Admit: 2018-04-27 | Discharge: 2018-04-27 | Disposition: A | Discharge status: Home / Self Care | Payer: Medicare Other | Source: Ambulatory Visit | Attending: Surgery | Admitting: Surgery

## 2018-04-27 ENCOUNTER — Encounter: Payer: Self-pay | Admitting: *Deleted

## 2018-04-27 ENCOUNTER — Encounter: Payer: Self-pay | Admitting: Radiation Oncology

## 2018-04-27 DIAGNOSIS — C50912 Malignant neoplasm of unspecified site of left female breast: Secondary | ICD-10-CM

## 2018-04-27 MED ORDER — GADOBENATE DIMEGLUMINE 529 MG/ML IV SOLN
14.0000 mL | Freq: Once | INTRAVENOUS | Status: AC | PRN
  Administered 2018-04-27: 14 mL via INTRAVENOUS

## 2018-05-26 ENCOUNTER — Telehealth: Payer: Self-pay | Admitting: Hematology and Oncology

## 2018-05-26 NOTE — Telephone Encounter (Signed)
Patient called to reschedule  °

## 2018-05-30 ENCOUNTER — Ambulatory Visit: Payer: Medicare Other | Admitting: Hematology and Oncology

## 2018-06-03 ENCOUNTER — Inpatient Hospital Stay: Payer: Medicare Other | Attending: Hematology and Oncology | Admitting: Hematology and Oncology

## 2018-06-03 ENCOUNTER — Telehealth: Payer: Self-pay | Admitting: Hematology and Oncology

## 2018-06-03 DIAGNOSIS — Z170 Estrogen receptor positive status [ER+]: Secondary | ICD-10-CM | POA: Diagnosis not present

## 2018-06-03 DIAGNOSIS — Z79811 Long term (current) use of aromatase inhibitors: Secondary | ICD-10-CM | POA: Insufficient documentation

## 2018-06-03 DIAGNOSIS — C50312 Malignant neoplasm of lower-inner quadrant of left female breast: Secondary | ICD-10-CM | POA: Diagnosis not present

## 2018-06-03 DIAGNOSIS — Z7982 Long term (current) use of aspirin: Secondary | ICD-10-CM | POA: Diagnosis not present

## 2018-06-03 NOTE — Assessment & Plan Note (Signed)
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  Neoadjuvant antiestrogen therapy with letrozole 2.5 mg daily x6 months 2. followed by mastectomy 3. Adjuvant radiation  4.  Followed by antiestrogen therapy x7 years ----------------------------------------------------------------------------- Letrozole toxicities:  Return to clinic in 2 months with mammogram and ultrasound and follow-up.

## 2018-06-03 NOTE — Telephone Encounter (Signed)
Gave patient avs and calendar of upcoming September appointments.  °

## 2018-06-03 NOTE — Progress Notes (Signed)
Patient Care Team: [name], MD as PCP - General (Family Medicine)  DIAGNOSIS:  Encounter Diagnosis  Name Primary?  . 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; stage II a clinical stage AJCC 8      04/27/2018 Cancer Staging    Staging form: Breast, AJCC 8th Edition - Clinical: Stage IIA (cT3, cN0, cM0, G2, ER+, PR+, HER2-) - Signed by Eppie Gibson, MD on 04/27/2018      04/27/2018 -  Anti-estrogen oral therapy    Neoadjuvant antiestrogen therapy with letrozole 2.5 mg daily       CHIEF COMPLIANT: Follow-up on letrozole therapy  INTERVAL HISTORY: Jessica Harrell is a 77 year old with above-mentioned history of large palpable left breast mass who is currently on letrozole therapy since a month ago and is here today to discuss any side effects to treatment.  Overall she appears to be tolerating letrozole extremely well.  She does not have any significant hot flashes arthralgias or myalgias.  REVIEW OF SYSTEMS:   Constitutional: Denies fevers, chills or abnormal weight loss Eyes: Denies blurriness of vision Ears, nose, mouth, throat, and face: Denies mucositis or sore throat Respiratory: Denies cough, dyspnea or wheezes Cardiovascular: Denies palpitation, chest discomfort Gastrointestinal:  Denies nausea, heartburn or change in bowel habits Skin: Denies abnormal skin rashes Lymphatics: Denies new lymphadenopathy or easy bruising Neurological:Denies numbness, tingling or new weaknesses Behavioral/Psych:  Mood is stable, no new changes  Extremities: No lower extremity edema Breast: Large left breast lump All other systems were reviewed with the patient and are negative.  I have reviewed the past medical history, past surgical history, social history and family history with the patient and they are unchanged from previous note.  ALLERGIES:  has No Known Allergies.  MEDICATIONS:  Current Outpatient Medications  Medication Sig Dispense Refill  . aspirin EC 81 MG tablet Take 81 mg by mouth daily.    . cholecalciferol (VITAMIN D) 1000 units tablet Take 1,000 Units by mouth daily.    Marland Kitchen letrozole (FEMARA) 2.5 MG tablet Take 1 tablet (2.5 mg total) by mouth daily. 90 tablet 3  . Red Yeast Rice Extract (RED YEAST RICE PO) Take by mouth.     No current facility-administered medications for this visit.     PHYSICAL EXAMINATION: ECOG PERFORMANCE STATUS: 1 - Symptomatic but completely ambulatory  Vitals:   06/03/18 1138  BP: (!) 145/70  Pulse: 63  Resp: 17  Temp: 97.6 F (36.4 C)  SpO2: 97%   Filed Weights   06/03/18 1138  Weight: 144 lb 1.6 oz (65.4 kg)    GENERAL:alert, no distress and comfortable SKIN: skin color, texture, turgor are normal, no rashes or significant lesions EYES: normal, Conjunctiva are pink and non-injected, sclera clear OROPHARYNX:no exudate, no erythema and lips, buccal mucosa, and tongue normal  NECK: supple, thyroid normal size, non-tender, without nodularity LYMPH:  no palpable lymphadenopathy in the cervical, axillary or inguinal LUNGS: clear to auscultation and percussion with normal breathing effort HEART: regular rate & rhythm and no murmurs and no lower extremity edema  ABDOMEN:abdomen soft, non-tender and normal bowel sounds MUSCULOSKELETAL:no cyanosis of digits and no clubbing  NEURO: alert & oriented x 3 with fluent speech, no focal motor/sensory deficits EXTREMITIES: No lower extremity edema  LABORATORY DATA:  I have reviewed the data as  listed CMP Latest Ref Rng & Units 09/22/2010  Glucose 70 - 99 mg/dL 88  BUN 6 - 23 mg/dL 9  Creatinine 0.4 - 1.2 mg/dL 0.96  Sodium 135 - 145 mEq/L 133(L)  Potassium 3.5 - 5.1 mEq/L 4.1  Chloride 96 - 112 mEq/L 99  CO2 19 - 32 mEq/L 28  Calcium 8.4 - 10.5 mg/dL 9.3    Lab Results  Component Value Date   HGB 14.7 09/22/2010   HCT 42.6 09/22/2010    ASSESSMENT & PLAN:  Malignant neoplasm of lower-inner quadrant of left breast in female, estrogen receptor positive ([facility]) 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  Neoadjuvant antiestrogen therapy with letrozole 2.5 mg daily x6 months 2. followed by mastectomy 3. Adjuvant radiation  4.  Followed by antiestrogen therapy x7 years ----------------------------------------------------------------------------- Letrozole toxicities: Denies any side effects like hot flashes or myalgias. Tolerating it extremely well.  Return to clinic in 2 months with mammogram and ultrasound and follow-up.     Orders Placed This Encounter  Procedures  . MM DIAG BREAST TOMO UNI LEFT    Standing Status:   Future    Standing Expiration Date:   06/04/2019    Order Specific Question:   Reason for Exam (SYMPTOM  OR DIAGNOSIS REQUIRED)    Answer:   Noe-adj anti estrogen therapy eval    Order Specific Question:   Preferred imaging location?    Answer:   [facility]  . US BREAST LTD UNI LEFT INC AXILLA    Standing Status:   Future    Standing Expiration Date:   08/04/2019    Order Specific Question:   Reason for Exam (SYMPTOM  OR DIAGNOSIS REQUIRED)    Answer:   Left breast cancer mid point of anti-estrogen therapy    Order Specific Question:   Preferred imaging location?    Answer:    [facility]   The patient has a good understanding of the overall plan. she agrees with it. she will call with any problems that may develop before the next visit here.   Harriette Ohara, MD 06/03/18

## 2018-08-02 ENCOUNTER — Ambulatory Visit
Admission: RE | Admit: 2018-08-02 | Discharge: 2018-08-02 | Disposition: A | Discharge status: Home / Self Care | Payer: Medicare Other | Source: Ambulatory Visit | Attending: Hematology and Oncology | Admitting: Hematology and Oncology

## 2018-08-02 DIAGNOSIS — C50312 Malignant neoplasm of lower-inner quadrant of left female breast: Secondary | ICD-10-CM

## 2018-08-02 DIAGNOSIS — Z170 Estrogen receptor positive status [ER+]: Principal | ICD-10-CM

## 2018-08-09 ENCOUNTER — Telehealth: Payer: Self-pay | Admitting: Hematology and Oncology

## 2018-08-09 ENCOUNTER — Inpatient Hospital Stay: Payer: Medicare Other | Attending: Hematology and Oncology | Admitting: Hematology and Oncology

## 2018-08-09 DIAGNOSIS — Z79811 Long term (current) use of aromatase inhibitors: Secondary | ICD-10-CM

## 2018-08-09 DIAGNOSIS — C50312 Malignant neoplasm of lower-inner quadrant of left female breast: Secondary | ICD-10-CM | POA: Diagnosis present

## 2018-08-09 DIAGNOSIS — Z170 Estrogen receptor positive status [ER+]: Secondary | ICD-10-CM | POA: Insufficient documentation

## 2018-08-09 NOTE — Assessment & Plan Note (Signed)
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  Neoadjuvant antiestrogen therapy with letrozole 2.5 mg daily x6 months started 04/26/2018 2. followed by mastectomy 3. Adjuvant radiation  4.  Followed by antiestrogen therapy x7 years ----------------------------------------------------------------------------- Letrozole toxicities: Denies any side effects like hot flashes or myalgias. Tolerating it extremely well.  Mammogram and ultrasound 08/02/2018: Slight decrease in the known left breast cancer now measures 5.4 x 2.2 x 5.2 cm previously it was 5.3 x 2.7 x 6.1 cm.  Radiology review: I discussed with the patient that the tumor on the trunk very minimally.  I do not believe that it will shrink significantly even if he use antiestrogen therapy for the full 6 months.  However patient wishes to continue with that.  We will see how it looks like in 3 more months with the breast MRI and follow-up. 

## 2018-08-09 NOTE — Progress Notes (Signed)
Patient Care Team: [name], MD as PCP - General (Family Medicine)  DIAGNOSIS:  Encounter Diagnosis  Name Primary?  . 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  She appears to be tolerating letrozole extremely well.  She had a recent mammogram and ultrasound after being on letrozole for 3 months.  There was a small decrease in the size of the tumor.  REVIEW OF SYSTEMS:   Constitutional: Denies fevers, chills or abnormal weight loss Eyes: Denies blurriness of vision Ears, nose, mouth, throat, and face: Denies mucositis or sore throat Respiratory: Denies cough, dyspnea or wheezes Cardiovascular: Denies palpitation, chest discomfort Gastrointestinal:  Denies nausea, heartburn or change in bowel habits Skin: Denies abnormal skin rashes Lymphatics: Denies new lymphadenopathy or easy bruising Neurological:Denies numbness, tingling or new  weaknesses Behavioral/Psych: Mood is stable, no new changes  Extremities: No lower extremity edema Breast:  denies any pain or lumps or nodules in either breasts All other systems were reviewed with the patient and are negative.  I have reviewed the past medical history, past surgical history, social history and family history with the patient and they are unchanged from previous note.  ALLERGIES:  has No Known Allergies.  MEDICATIONS:  Current Outpatient Medications  Medication Sig Dispense Refill  . aspirin EC 81 MG tablet Take 81 mg by mouth daily.    . cholecalciferol (VITAMIN D) 1000 units tablet Take 1,000 Units by mouth daily.    Marland Kitchen letrozole (FEMARA) 2.5 MG tablet Take 1 tablet (2.5 mg total) by mouth daily. 90 tablet 3  . Red Yeast Rice Extract (RED YEAST RICE PO) Take by mouth.     No current facility-administered medications for this visit.     PHYSICAL EXAMINATION: ECOG PERFORMANCE STATUS: 1 - Symptomatic but completely ambulatory  Vitals:   08/09/18 1007  BP: (!) 141/69  Pulse: (!) 59  Resp: 18  Temp: 97.8 F (36.6 C)  SpO2: 98%   Filed Weights   08/09/18 1007  Weight: 146 lb 11.2 oz (66.5 kg)    GENERAL:alert, no distress and comfortable SKIN: skin color, texture, turgor are normal, no rashes or significant lesions EYES: normal, Conjunctiva are pink and non-injected, sclera clear OROPHARYNX:no exudate, no erythema and lips, buccal mucosa, and tongue normal  NECK: supple, thyroid normal size, non-tender, without nodularity LYMPH:  no palpable lymphadenopathy in the cervical, axillary or inguinal LUNGS: clear to auscultation and percussion with normal breathing effort HEART: regular rate & rhythm and no murmurs and no lower  extremity edema ABDOMEN:abdomen soft, non-tender and normal bowel sounds MUSCULOSKELETAL:no cyanosis of digits and no clubbing  NEURO: alert & oriented x 3 with fluent speech, no focal motor/sensory deficits EXTREMITIES: No lower  extremity edema   LABORATORY DATA:  I have reviewed the data as listed CMP Latest Ref Rng & Units 09/22/2010  Glucose 70 - 99 mg/dL 88  BUN 6 - 23 mg/dL 9  Creatinine 0.4 - 1.2 mg/dL 0.96  Sodium 135 - 145 mEq/L 133(L)  Potassium 3.5 - 5.1 mEq/L 4.1  Chloride 96 - 112 mEq/L 99  CO2 19 - 32 mEq/L 28  Calcium 8.4 - 10.5 mg/dL 9.3    Lab Results  Component Value Date   HGB 14.7 09/22/2010   HCT 42.6 09/22/2010    ASSESSMENT & PLAN:  Malignant neoplasm of lower-inner quadrant of left breast in female, estrogen receptor positive ([facility]) 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  Neoadjuvant antiestrogen therapy with letrozole 2.5 mg daily x6 months started 04/26/2018 2. followed by mastectomy 3. Adjuvant radiation  4.  Followed by antiestrogen therapy x7 years ----------------------------------------------------------------------------- Letrozole toxicities: Denies any side effects like hot flashes or myalgias. Tolerating it extremely well.  Mammogram and ultrasound 08/02/2018: Slight decrease in the known left breast cancer now measures 5.4 x 2.2 x 5.2 cm previously it was 5.3 x 2.7 x 6.1 cm.  Radiology review: I discussed with the patient that the tumor only shrink  minimally.  Patient is still very interested in pursuing a neoadjuvant approach to see if lumpectomy can be performed at a later point. Return to clinic in 3 months with mammogram and ultrasound and follow-up  Orders Placed This Encounter  Procedures  . MM DIAG BREAST TOMO UNI LEFT    Standing Status:   Future    Standing Expiration Date:   08/10/2019    Order Specific Question:   Reason for Exam (SYMPTOM  OR DIAGNOSIS REQUIRED)    Answer:   Neoadj anti  estrogen therapy efficacy    Order Specific Question:   Preferred imaging location?    Answer:   [facility]  . US BREAST LTD UNI LEFT INC AXILLA    Standing Status:   Future    Standing Expiration Date:   10/10/2019    Order Specific Question:   Reason for Exam (SYMPTOM  OR DIAGNOSIS REQUIRED)    Answer:   Neo adj anti estrogen therapy efficacy    Order Specific Question:   Preferred imaging location?    Answer:   [facility]   The patient has a good understanding of the overall plan. she agrees with it. she will call with any problems that may develop before the next visit here.   Harriette Ohara, MD 08/09/18

## 2018-08-09 NOTE — Telephone Encounter (Signed)
Gave patient avs and calendar.  Referrals noted.

## 2018-11-01 NOTE — Progress Notes (Signed)
Patient Care Team: [name], MD as PCP - General (Family Medicine)  DIAGNOSIS:    ICD-10-CM   1. 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 with above-mentioned history of left breast cancer currently neoadjuvant antiestrogen therapy with letrozole. 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 She presents to the clinic today with her husband and daughter to discuss her recent mammogram. She reviewed her medication list with me. She denies any side effects of letrozole or pain.   REVIEW OF SYSTEMS:   Constitutional: Denies fevers, chills or abnormal weight loss Eyes: Denies blurriness of vision Ears, nose, mouth, throat, and face: Denies mucositis or sore throat Respiratory: Denies cough, dyspnea or wheezes Cardiovascular: Denies  palpitation, chest discomfort Gastrointestinal:  Denies nausea, heartburn or change in bowel habits Skin: Denies abnormal skin rashes Lymphatics: Denies new lymphadenopathy or easy bruising Neurological:Denies numbness, tingling or new weaknesses Behavioral/Psych: Mood is stable, no new changes  Extremities: No lower extremity edema Breast: denies any pain or lumps or nodules in either breasts All other systems were reviewed with the patient and are negative.  I have reviewed the past medical history, past surgical history, social history and family history with the patient and they are unchanged from previous note.  ALLERGIES:  has No Known Allergies.  MEDICATIONS:  Current Outpatient Medications  Medication Sig Dispense Refill  . aspirin EC 81 MG tablet Take 81 mg by mouth daily.    . cholecalciferol (VITAMIN D) 1000 units tablet Take 1,000 Units by mouth daily.    Marland Kitchen letrozole (FEMARA) 2.5 MG tablet Take 1 tablet (2.5 mg total) by mouth daily. 90 tablet 3  . Red Yeast Rice Extract (RED YEAST RICE PO) Take by mouth.     No current facility-administered medications for this visit.     PHYSICAL EXAMINATION: ECOG PERFORMANCE STATUS: 1 - Symptomatic but completely ambulatory  Vitals:   11/08/18 1359  BP: (!) 135/51  Pulse: 72  Resp: 16  Temp: (!) 97.5 F (36.4 C)  SpO2: 98%   Filed Weights   11/08/18 1359  Weight: 149 lb 9.6 oz (67.9 kg)    GENERAL:alert, no distress and comfortable SKIN: skin color, texture, turgor are normal, no rashes or significant lesions EYES: normal, Conjunctiva are pink and non-injected, sclera clear OROPHARYNX:no exudate, no  erythema and lips, buccal mucosa, and tongue normal  NECK: supple, thyroid normal size, non-tender, without nodularity LYMPH:  no palpable lymphadenopathy in the cervical, axillary or inguinal LUNGS: clear to auscultation and percussion with normal breathing effort HEART: regular rate & rhythm and no murmurs and no lower  extremity edema ABDOMEN:abdomen soft, non-tender and normal bowel sounds MUSCULOSKELETAL:no cyanosis of digits and no clubbing  NEURO: alert & oriented x 3 with fluent speech, no focal motor/sensory deficits EXTREMITIES: No lower extremity edema  LABORATORY DATA:  I have reviewed the data as listed CMP Latest Ref Rng & Units 09/22/2010  Glucose 70 - 99 mg/dL 88  BUN 6 - 23 mg/dL 9  Creatinine 0.4 - 1.2 mg/dL 0.96  Sodium 135 - 145 mEq/L 133(L)  Potassium 3.5 - 5.1 mEq/L 4.1  Chloride 96 - 112 mEq/L 99  CO2 19 - 32 mEq/L 28  Calcium 8.4 - 10.5 mg/dL 9.3    Lab Results  Component Value Date   HGB 14.7 09/22/2010   HCT 42.6 09/22/2010    ASSESSMENT & PLAN:  Malignant neoplasm of lower-inner quadrant of left breast in female, estrogen receptor positive ([facility]) 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 Tolerating it extremely well.  Mammogram and ultrasound 08/02/2018: Slight decrease in the known left breast cancer now measures 5.4 x 2.2 x 5.2 cm previously it was 5.3 x 2.7 x 6.1 cm.  Mammogram and ultrasound 11/07/2018: Left breast lower inner quadrant 2.2 x 5.1 x 4.6 cm (2.2 x 5.4 x 5.1 cm on 08/02/2018 and approximately 2.7 x 5.3 x 6.0 cm on 04/07/2018).  There is only a subtle decrease in the size of the tumor, we will continue with neoadjuvant antiestrogen therapy for another 3  months and repeat mammogram and ultrasound    Orders Placed This Encounter  Procedures  . US BREAST LTD UNI LEFT INC AXILLA    Standing Status:   Future    Standing Expiration Date:   01/10/2020    Order Specific Question:   Reason for Exam (SYMPTOM  OR DIAGNOSIS REQUIRED)    Answer:   breast cancer on anti estrogen therapy    Order Specific Question:   Preferred imaging location?    Answer:   [facility]  . MM DIAG BREAST TOMO UNI LEFT    Standing Status:   Future    Standing Expiration Date:   11/09/2019    Order Specific Question:   Reason for Exam (SYMPTOM  OR DIAGNOSIS REQUIRED)    Answer:   Breast cancer on anti estrogen therapy    Order Specific Question:   Preferred imaging location?    Answer:   [facility]   The patient has a good understanding of the overall plan. she agrees with it. she will call with any problems that may develop before the next visit here.  Nicholas Lose, MD 11/08/2018   I, Cloyde Reams Dorshimer, am acting as scribe for Nicholas Lose, MD.  I have reviewed the above documentation for accuracy and completeness, and I agree with the above.

## 2018-11-07 ENCOUNTER — Ambulatory Visit
Admission: RE | Admit: 2018-11-07 | Discharge: 2018-11-07 | Disposition: A | Discharge status: Home / Self Care | Payer: Medicare Other | Source: Ambulatory Visit | Attending: Hematology and Oncology | Admitting: Hematology and Oncology

## 2018-11-07 DIAGNOSIS — C50312 Malignant neoplasm of lower-inner quadrant of left female breast: Secondary | ICD-10-CM

## 2018-11-07 DIAGNOSIS — Z170 Estrogen receptor positive status [ER+]: Principal | ICD-10-CM

## 2018-11-08 ENCOUNTER — Telehealth: Payer: Self-pay | Admitting: Hematology and Oncology

## 2018-11-08 ENCOUNTER — Inpatient Hospital Stay: Payer: Medicare Other | Attending: Hematology and Oncology | Admitting: Hematology and Oncology

## 2018-11-08 DIAGNOSIS — Z7982 Long term (current) use of aspirin: Secondary | ICD-10-CM | POA: Insufficient documentation

## 2018-11-08 DIAGNOSIS — Z79811 Long term (current) use of aromatase inhibitors: Secondary | ICD-10-CM | POA: Diagnosis not present

## 2018-11-08 DIAGNOSIS — C50312 Malignant neoplasm of lower-inner quadrant of left female breast: Secondary | ICD-10-CM | POA: Insufficient documentation

## 2018-11-08 DIAGNOSIS — Z170 Estrogen receptor positive status [ER+]: Secondary | ICD-10-CM | POA: Insufficient documentation

## 2018-11-08 DIAGNOSIS — Z9012 Acquired absence of left breast and nipple: Secondary | ICD-10-CM | POA: Diagnosis not present

## 2018-11-08 DIAGNOSIS — Z923 Personal history of irradiation: Secondary | ICD-10-CM | POA: Insufficient documentation

## 2018-11-08 DIAGNOSIS — Z79899 Other long term (current) drug therapy: Secondary | ICD-10-CM | POA: Diagnosis not present

## 2018-11-08 NOTE — Assessment & Plan Note (Signed)
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 Tolerating it extremely well.  Mammogram and ultrasound 08/02/2018: Slight decrease in the known left breast cancer now measures 5.4 x 2.2 x 5.2 cm previously it was 5.3 x 2.7 x 6.1 cm.  Mammogram and ultrasound 11/07/2018: Left breast lower inner quadrant 2.2 x 5.1 x 4.6 cm (2.2 x 5.4 x 5.1 cm on 08/02/2018 and approximately 2.7 x 5.3 x 6.0 cm on 04/07/2018).  There is only a subtle decrease in the size of the tumor, we will continue with neoadjuvant antiestrogen therapy for another 3 months and repeat mammogram and ultrasound

## 2018-11-08 NOTE — Telephone Encounter (Signed)
Gave patient avs and calendar.  [facility] will call patient with appts.

## 2018-11-11 ENCOUNTER — Other Ambulatory Visit: Payer: Medicare Other

## 2019-02-07 ENCOUNTER — Ambulatory Visit
Admission: RE | Admit: 2019-02-07 | Discharge: 2019-02-07 | Disposition: A | Discharge status: Home / Self Care | Payer: Medicare Other | Source: Ambulatory Visit | Attending: Hematology and Oncology | Admitting: Hematology and Oncology

## 2019-02-07 ENCOUNTER — Other Ambulatory Visit: Payer: Self-pay | Admitting: Hematology and Oncology

## 2019-02-07 DIAGNOSIS — Z170 Estrogen receptor positive status [ER+]: Principal | ICD-10-CM

## 2019-02-07 DIAGNOSIS — C50312 Malignant neoplasm of lower-inner quadrant of left female breast: Secondary | ICD-10-CM

## 2019-02-08 NOTE — Progress Notes (Signed)
Patient Care Team: [name], MD as PCP - General (Family Medicine)  DIAGNOSIS:    ICD-10-CM   1. Malignant neoplasm of lower-inner quadrant of left breast in female, estrogen receptor positive ([place]) C50.312    Z17.0     SUMMARY OF ONCOLOGIC HISTORY:   Malignant neoplasm of lower-inner quadrant of left breast in female, estrogen receptor positive ([place])   04/12/2018 Initial Diagnosis    Palpable mass lower inner quadrant left breast approximately measures 6 cm, 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, ER 100%, PR 5%, HER-2 negative ratio 1.23, Ki-67 15% 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; stage II a clinical stage AJCC 8    04/27/2018 Cancer Staging    Staging form: Breast, AJCC 8th Edition - Clinical: Stage IIA (cT3, cN0, cM0, G2, ER+, PR+, HER2-) - Signed by Eppie Gibson, MD on 04/27/2018    04/27/2018 -  Anti-estrogen oral therapy    Neoadjuvant antiestrogen therapy with letrozole 2.5 mg daily     CHIEF COMPLIANT:  Follow-up on neoadjuvant antiestrogen therapy with letrozole  INTERVAL HISTORY: Jessica Harrell is a 78 y.o. with above-mentioned history of left breast cancer currently on neoadjuvant antiestrogen therapy with letrozole. Her most recent mammogram from 02/07/19 showed mild decrease in the size of the left breast mass and stable appearance of the left breast distortion. She presents to the clinic today with her husband and daughter. She prefers to push surgery back until 04/2019 after her next scheduled mammogram. She reviewed her medication list with me.   REVIEW OF SYSTEMS:   Constitutional: Denies fevers, chills or abnormal weight loss Eyes: Denies blurriness of vision Ears, nose, mouth, throat, and face: Denies mucositis or sore throat Respiratory: Denies cough, dyspnea or wheezes Cardiovascular: Denies palpitation, chest discomfort Gastrointestinal: Denies nausea, heartburn or change in bowel habits Skin: Denies  abnormal skin rashes Lymphatics: Denies new lymphadenopathy or easy bruising Neurological: Denies numbness, tingling or new weaknesses Behavioral/Psych: Mood is stable, no new changes  Extremities: No lower extremity edema Breast: denies any pain in either breasts (+) palpable left breast lump All other systems were reviewed with the patient and are negative.  I have reviewed the past medical history, past surgical history, social history and family history with the patient and they are unchanged from previous note.  ALLERGIES:  has No Known Allergies.  MEDICATIONS:  Current Outpatient Medications  Medication Sig Dispense Refill  . aspirin EC 81 MG tablet Take 81 mg by mouth daily.    . cholecalciferol (VITAMIN D) 1000 units tablet Take 1,000 Units by mouth daily.    Marland Kitchen letrozole (FEMARA) 2.5 MG tablet Take 1 tablet (2.5 mg total) by mouth daily. 90 tablet 3  . Red Yeast Rice Extract (RED YEAST RICE PO) Take by mouth.     No current facility-administered medications for this visit.     PHYSICAL EXAMINATION: ECOG PERFORMANCE STATUS: 1 - Symptomatic but completely ambulatory  Vitals:   02/09/19 1204  BP: 140/75  Pulse: 65  Resp: 17  Temp: 97.9 F (36.6 C)  SpO2: 98%   Filed Weights   02/09/19 1204  Weight: 148 lb 6.4 oz (67.3 kg)    GENERAL: alert, no distress and comfortable SKIN: skin color, texture, turgor are normal, no rashes or significant lesions EYES: normal, Conjunctiva are pink and non-injected, sclera clear OROPHARYNX: no exudate, no erythema and lips, buccal mucosa, and tongue normal  NECK: supple, thyroid normal size, non-tender, without nodularity LYMPH: no palpable lymphadenopathy  in the cervical, axillary or inguinal LUNGS: clear to auscultation and percussion with normal breathing effort HEART: regular rate & rhythm and no murmurs and no lower extremity edema ABDOMEN: abdomen soft, non-tender and normal bowel sounds MUSCULOSKELETAL: no cyanosis of digits  and no clubbing  NEURO: alert & oriented x 3 with fluent speech, no focal motor/sensory deficits EXTREMITIES: No lower extremity edema  LABORATORY DATA:  I have reviewed the data as listed CMP Latest Ref Rng & Units 09/22/2010  Glucose 70 - 99 mg/dL 88  BUN 6 - 23 mg/dL 9  Creatinine 0.4 - 1.2 mg/dL 0.96  Sodium 135 - 145 mEq/L 133(L)  Potassium 3.5 - 5.1 mEq/L 4.1  Chloride 96 - 112 mEq/L 99  CO2 19 - 32 mEq/L 28  Calcium 8.4 - 10.5 mg/dL 9.3    Lab Results  Component Value Date   HGB 14.7 09/22/2010   HCT 42.6 09/22/2010    ASSESSMENT & PLAN:  Malignant neoplasm of lower-inner quadrant of left breast in female, estrogen receptor positive ([facility]) 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Neoadjuvant antiestrogen therapy with letrozole 2.5 mg daily x6 monthsstarted 04/26/2018 2.followed by mastectomy 3.Adjuvant radiation 4.Followed by antiestrogen therapy x7 years ----------------------------------------------------------------------------- Letrozole toxicities: Denies any side effects like hot flashes or myalgias. Tolerating it extremely well.  Mammogram and ultrasound8/27/2019: Slight decrease in the known left breast cancer now measures 5.4 x 2.2 x 5.2 cm previously it was 5.3 x 2.7 x 6.1 cm.  Mammogram and ultrasound 02/07/2019: Mild decrease in size.  4.5 x 2 x 3.8 cm (was 4.5 x 2.2 x 4.4 cm and prior to that was 5.4 x 2.2 x 3.9 cm)  Counseling: I discussed with the patient that there has not been any substantial decrease in the size of the tumor.  Based on this I recommended that the patient proceed with surgery with mastectomy. She wants to do the surgery after the May mammograms are performed on  both her breasts. I sent referral to Dr. Donne Hazel to see the patient. Return to clinic after surgery to discuss pathology report.    No orders of the defined types were placed in this encounter.  The patient has a good understanding of the overall plan. she agrees with it. she will call with any problems that may develop before the next visit here.  Nicholas Lose, MD 02/09/2019  Julious Oka Dorshimer am acting as scribe for Dr. Nicholas Lose.  I have reviewed the above documentation for accuracy and completeness, and I agree with the above.

## 2019-02-09 ENCOUNTER — Inpatient Hospital Stay: Payer: Medicare Other | Attending: Hematology and Oncology | Admitting: Hematology and Oncology

## 2019-02-09 DIAGNOSIS — Z79899 Other long term (current) drug therapy: Secondary | ICD-10-CM | POA: Diagnosis not present

## 2019-02-09 DIAGNOSIS — Z9012 Acquired absence of left breast and nipple: Secondary | ICD-10-CM

## 2019-02-09 DIAGNOSIS — Z79811 Long term (current) use of aromatase inhibitors: Secondary | ICD-10-CM | POA: Insufficient documentation

## 2019-02-09 DIAGNOSIS — Z923 Personal history of irradiation: Secondary | ICD-10-CM | POA: Insufficient documentation

## 2019-02-09 DIAGNOSIS — Z170 Estrogen receptor positive status [ER+]: Secondary | ICD-10-CM | POA: Insufficient documentation

## 2019-02-09 DIAGNOSIS — Z7982 Long term (current) use of aspirin: Secondary | ICD-10-CM

## 2019-02-09 DIAGNOSIS — C50312 Malignant neoplasm of lower-inner quadrant of left female breast: Secondary | ICD-10-CM

## 2019-02-09 NOTE — Assessment & Plan Note (Signed)
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Neoadjuvant antiestrogen therapy with letrozole 2.5 mg daily x6 monthsstarted 04/26/2018 2.followed by mastectomy 3.Adjuvant radiation 4.Followed by antiestrogen therapy x7 years ----------------------------------------------------------------------------- Letrozole toxicities: Denies any side effects like hot flashes or myalgias. Tolerating it extremely well.  Mammogram and ultrasound8/27/2019: Slight decrease in the known left breast cancer now measures 5.4 x 2.2 x 5.2 cm previously it was 5.3 x 2.7 x 6.1 cm.  Mammogram and ultrasound 02/07/2019: Mild decrease in size.  4.5 x 2 x 3.8 cm (was 4.5 x 2.2 x 4.4 cm and prior to that was 5.4 x 2.2 x 3.9 cm)  Counseling: I discussed with the patient that there has not been any substantial decrease in the size of the tumor.  Based on this I recommended that the patient proceed with surgery with mastectomy.  Return to clinic after surgery to discuss pathology report.

## 2019-04-10 ENCOUNTER — Other Ambulatory Visit: Payer: Self-pay | Admitting: Hematology and Oncology

## 2019-04-10 ENCOUNTER — Other Ambulatory Visit: Payer: Self-pay

## 2019-04-10 ENCOUNTER — Ambulatory Visit
Admission: RE | Admit: 2019-04-10 | Discharge: 2019-04-10 | Disposition: A | Discharge status: Home / Self Care | Payer: Medicare Other | Source: Ambulatory Visit | Attending: Hematology and Oncology | Admitting: Hematology and Oncology

## 2019-04-10 DIAGNOSIS — Z170 Estrogen receptor positive status [ER+]: Principal | ICD-10-CM

## 2019-04-10 DIAGNOSIS — C50312 Malignant neoplasm of lower-inner quadrant of left female breast: Secondary | ICD-10-CM

## 2019-04-17 ENCOUNTER — Other Ambulatory Visit: Payer: Self-pay | Admitting: Surgery

## 2019-04-17 DIAGNOSIS — Z170 Estrogen receptor positive status [ER+]: Secondary | ICD-10-CM

## 2019-04-17 DIAGNOSIS — C50912 Malignant neoplasm of unspecified site of left female breast: Secondary | ICD-10-CM

## 2019-04-19 ENCOUNTER — Other Ambulatory Visit: Payer: Self-pay | Admitting: Hematology and Oncology

## 2019-04-21 ENCOUNTER — Encounter: Payer: Self-pay | Admitting: *Deleted

## 2019-04-25 ENCOUNTER — Telehealth: Payer: Self-pay | Admitting: Hematology and Oncology

## 2019-04-25 NOTE — Telephone Encounter (Signed)
Left message re June f/u. Schedule mailed.

## 2019-05-04 NOTE — Progress Notes (Addendum)
[place], [address] [address] Phone: [phone] Fax: [phone]      Your procedure is scheduled on June 2  Report to [facility] Main Entrance "A" at 0530 A.M., and check in at the Admitting office.  Call this number if you have problems the morning of surgery:  [phone]  Call [phone] if you have any questions prior to your surgery date Monday-Friday 8am-4pm    Remember:  Do not eat after midnight  You can drink clear liquids until 0430 am the morning of surgery.  Clear Liquids Included: water, black coffee only, Gatorade, carbonated beverages, and  Non-citrus juices without pulp    Take these medicines the morning of surgery with A SIP OF WATER  letrozole [facility])   7 days prior to surgery STOP taking any Aspirin (unless otherwise instructed by your surgeon), Aleve, Naproxen, Ibuprofen, Motrin, Advil, Goody's, BC's, all herbal medications, fish oil, and all vitamins.  Follow your surgeon's instructions on when to stop Aspirin.  If no instructions were given by your surgeon then you will need to call the office to get those instructions.      The Morning of Surgery  Do not wear jewelry, make-up or nail polish.  Do not wear lotions, powders, or perfumes/colognes, or deodorant  Do not shave 48 hours prior to surgery.    Do not bring valuables to the hospital.  [facility] is not responsible for any belongings or valuables.  If you are a smoker, DO NOT Smoke 24 hours prior to surgery IF you wear a CPAP at night please bring your mask, tubing, and machine the morning of surgery   Remember that you must have someone to transport you home after your surgery, and remain with you for 24 hours if you are discharged the same day.   Contacts, glasses, hearing aids, dentures or bridgework may not be worn into surgery.    Leave your suitcase in the car.  After surgery it may be brought to your room.  For  patients admitted to the hospital, discharge time will be determined by your treatment team.  Patients discharged the day of surgery will not be allowed to drive home.    Special instructions:   [place]- Preparing For Surgery  Before surgery, you can play an important role. Because skin is not sterile, your skin needs to be as free of germs as possible. You can reduce the number of germs on your skin by washing with CHG (chlorahexidine gluconate) Soap before surgery.  CHG is an antiseptic cleaner which kills germs and bonds with the skin to continue killing germs even after washing.    Oral Hygiene is also important to reduce your risk of infection.  Remember - BRUSH YOUR TEETH THE MORNING OF SURGERY WITH YOUR REGULAR TOOTHPASTE  Please do not use if you have an allergy to CHG or antibacterial soaps. If your skin becomes reddened/irritated stop using the CHG.  Do not shave (including legs and underarms) for at least 48 hours prior to first CHG shower. It is OK to shave your face.  Please follow these instructions carefully.   1. Shower the NIGHT BEFORE SURGERY and the MORNING OF SURGERY with CHG Soap.   2. If you chose to wash your hair, wash your hair first as usual with your normal shampoo.  3. After you shampoo, rinse your hair and body thoroughly to remove the shampoo.  4. Use CHG as you would any other liquid soap. You can apply CHG directly to the skin and wash gently with a scrungie or a clean washcloth.   5. Apply the CHG Soap to your body ONLY FROM THE NECK DOWN.  Do not use on open wounds or open sores. Avoid contact with your eyes, ears, mouth and genitals (private parts). Wash Face and genitals (private parts)  with your normal soap.   6. Wash thoroughly, paying special attention to the area where your surgery will be performed.  7. Thoroughly rinse your body with warm water from the neck down.  8. DO NOT shower/wash with your normal soap after using and rinsing off the  CHG Soap.  9. Pat yourself dry with a CLEAN TOWEL.  10. Wear CLEAN PAJAMAS to bed the night before surgery, wear comfortable clothes the morning of surgery  11. Place CLEAN SHEETS on your bed the night of your first shower and DO NOT SLEEP WITH PETS.    Day of Surgery:  Do not apply any deodorants/lotions.  Please wear clean clothes to the hospital/surgery center.   Remember to brush your teeth WITH YOUR REGULAR TOOTHPASTE.   Please read over the following fact sheets that you were given.

## 2019-05-05 ENCOUNTER — Other Ambulatory Visit (HOSPITAL_COMMUNITY)
Admission: RE | Admit: 2019-05-05 | Discharge: 2019-05-05 | Disposition: A | Discharge status: Home / Self Care | Payer: Medicare Other | Source: Ambulatory Visit | Attending: Surgery | Admitting: Surgery

## 2019-05-05 ENCOUNTER — Other Ambulatory Visit: Payer: Self-pay

## 2019-05-05 ENCOUNTER — Encounter (HOSPITAL_COMMUNITY)
Admission: RE | Admit: 2019-05-05 | Discharge: 2019-05-05 | Disposition: A | Discharge status: Home / Self Care | Payer: Medicare Other | Source: Ambulatory Visit | Attending: Surgery | Admitting: Surgery

## 2019-05-05 ENCOUNTER — Encounter (HOSPITAL_COMMUNITY): Payer: Self-pay

## 2019-05-05 DIAGNOSIS — Z1159 Encounter for screening for other viral diseases: Secondary | ICD-10-CM | POA: Diagnosis not present

## 2019-05-05 DIAGNOSIS — Z01812 Encounter for preprocedural laboratory examination: Secondary | ICD-10-CM | POA: Insufficient documentation

## 2019-05-05 DIAGNOSIS — Z170 Estrogen receptor positive status [ER+]: Secondary | ICD-10-CM

## 2019-05-05 DIAGNOSIS — C50912 Malignant neoplasm of unspecified site of left female breast: Secondary | ICD-10-CM

## 2019-05-05 LAB — CBC
HCT: 46.2 % — ABNORMAL HIGH (ref 36.0–46.0)
Hemoglobin: 15.2 g/dL — ABNORMAL HIGH (ref 12.0–15.0)
MCH: 31.5 pg (ref 26.0–34.0)
MCHC: 32.9 g/dL (ref 30.0–36.0)
MCV: 95.7 fL (ref 80.0–100.0)
Platelets: 179 10*3/uL (ref 150–400)
RBC: 4.83 MIL/uL (ref 3.87–5.11)
RDW: 12.5 % (ref 11.5–15.5)
WBC: 7.5 10*3/uL (ref 4.0–10.5)
nRBC: 0 % (ref 0.0–0.2)

## 2019-05-05 LAB — BASIC METABOLIC PANEL
Anion gap: 9 (ref 5–15)
BUN: 9 mg/dL (ref 8–23)
CO2: 25 mmol/L (ref 22–32)
Calcium: 9.4 mg/dL (ref 8.9–10.3)
Chloride: 100 mmol/L (ref 98–111)
Creatinine, Ser: 0.92 mg/dL (ref 0.44–1.00)
GFR calc Af Amer: >60 mL/min (ref >60)
GFR calc non Af Amer: >60 mL/min (ref >60)
Glucose, Bld: 105 mg/dL — ABNORMAL HIGH (ref 70–99)
Potassium: 4.1 mmol/L (ref 3.5–5.1)
Sodium: 134 mmol/L — ABNORMAL LOW (ref 135–145)

## 2019-05-05 NOTE — Progress Notes (Signed)
PCP - Dr. Kathryne Eriksson Cardiologist - N/A  Chest x-ray - N/A EKG - N/A Stress Test - denies ECHO - denies Cardiac Cath - denies  Sleep Study - denies  Aspirin Instructions: Patient instructed to hold all Aspirin, NSAID's, herbal medications, fish oil and vitamins 7 days prior to surgery.   Anesthesia review:   Patient denies shortness of breath, fever, cough and chest pain at PAT appointment   Patient verbalized understanding of instructions that were given to them at the PAT appointment. Patient was also instructed that they will need to review over the PAT instructions again at home before surgery.

## 2019-05-06 LAB — NOVEL CORONAVIRUS, NAA (HOSP ORDER, SEND-OUT TO REF LAB; TAT 18-24 HRS): SARS-CoV-2, NAA: NOT DETECTED

## 2019-05-08 NOTE — H&P (Signed)
Alessandra Bevels Documented: 04/17/2019 9:19 AM Location: [facility] Patient #: [id number] DOB: 04/21/1941 Married / Language: English / Race: White Female   History of Present Illness (Theadore Blunck A. Ninfa Linden MD; 04/17/2019 9:30 AM) The patient is a 78 year old female who presents with breast cancer. She is here for long-term follow-up. I saw her almost a year ago with a large left breast cancer. She was seen by medical and radiation oncology and has been undergoing and hormonal therapy. The mass has been slowly shrinking. She was last seen by medical oncology in March. Mastectomy is to been recommended given the large size of the mass as well as the separate complex sclerosing lesion in the left breast. Again this was an invasive ER positive breast cancer. Her last mammograms recently showed the mass has decreased in size but is still quite large. Currently, she is doing well. She has no chest pain, cough, fever, shortness of breath, or cardiac issues.   Allergies (Sabrina Canty, CMA; 04/17/2019 9:20 AM) NSAIDs  Allergies Reconciled   Medication History (Sabrina Canty, CMA; 04/17/2019 9:20 AM) Atorvastatin Calcium (20MG  Tablet, Oral) Active. Letrozole (2.5MG  Tablet, Oral) Active. Vital-D Rx (1MG  Tablet, Oral) Active. Aspirin (81MG  Tablet, Oral) Active. Multivitamin (Oral) Active. Medications Reconciled  Vitals (Sabrina Canty CMA; 04/17/2019 9:21 AM) 04/17/2019 9:20 AM Weight: 146.38 lb Height: 66in Body Surface Area: 1.75 m Body Mass Index: 23.63 kg/m  Temp.: 97.34F(Oral)  Pulse: 96 (Regular)  BP: 98/60 (Sitting, Left Arm, Standard)       Physical Exam (Clotee Schlicker A. Ninfa Linden MD; 04/17/2019 9:31 AM) General Mental Status-Alert. General Appearance-Consistent with stated age. Hydration-Well hydrated. Voice-Normal.  Head and Neck Head-normocephalic, atraumatic with no lesions or palpable masses. Trachea-midline. Thyroid Gland  Characteristics - normal size and consistency.  Eye Eyeball - Bilateral-Extraocular movements intact. Sclera/Conjunctiva - Bilateral-No scleral icterus.  Chest and Lung Exam Chest and lung exam reveals -quiet, even and easy respiratory effort with no use of accessory muscles and on auscultation, normal breath sounds, no adventitious sounds and normal vocal resonance. Inspection Chest Wall - Normal. Back - normal.  Breast Breast - Left-Symmetric, Non Tender, No Biopsy scars, no Dimpling, No Inflammation, No Lumpectomy scars, No Mastectomy scars, No Peau d' Orange. Breast - Right-Symmetric, Non Tender, No Biopsy scars, no Dimpling, No Inflammation, No Lumpectomy scars, No Mastectomy scars, No Peau d' Orange. Note: There is still a large, almost fixed mass in the lower inner quadrant of the left breast. There is no erosion through the skin.   Cardiovascular Cardiovascular examination reveals -normal heart sounds, regular rate and rhythm with no murmurs and normal pedal pulses bilaterally.  Abdomen Inspection Inspection of the abdomen reveals - No Hernias. Skin - Scar - no surgical scars. Palpation/Percussion Palpation and Percussion of the abdomen reveal - Soft, Non Tender, No Rebound tenderness, No Rigidity (guarding) and No hepatosplenomegaly. Auscultation Auscultation of the abdomen reveals - Bowel sounds normal.  Neurologic - Did not examine.  Musculoskeletal - Did not examine.  Lymphatic Head & Neck  General Head & Neck Lymphatics: Bilateral - Description - Normal. Axillary  General Axillary Region: Bilateral - Description - Normal. Tenderness - Non Tender. Note: There is some mild shotty adenopathy in the Femoral & Inguinal - Did not examine.    Assessment & Plan (Gustabo Gordillo A. Ninfa Linden MD; 04/17/2019 9:32 AM) BREAST CANCER, LEFT (C50.912) Impression: Again, this is a patient with a large left breast cancer. Given the size, a left mastectomy with sentinel  lymph node biopsy  is still recommended. She is in agreement with this plan. I did discuss a referral to plastic surgery for reconstruction but she decided she did not want this. I again discussed the surgical procedure with her in detail. I discussed the risks which includes but is not limited to bleeding, infection, injury to surrounding structures, cardiopulmonary issues, DVT, etc. We discussed having several drains possibly postoperatively. We discussed postoperative care. She understands and wishes to proceed with surgery which will be scheduled

## 2019-05-08 NOTE — Anesthesia Preprocedure Evaluation (Addendum)
Anesthesia Evaluation  Patient identified by MRN, date of birth, ID band Patient awake    Reviewed: Allergy & Precautions, NPO status , Patient's Chart, lab work & pertinent test results  History of Anesthesia Complications Negative for: history of anesthetic complications  Airway Mallampati: III  TM Distance: >3 FB Neck ROM: Full    Dental no notable dental hx. (+) Teeth Intact   Pulmonary Current Smoker,    Pulmonary exam normal        Cardiovascular negative cardio ROS Normal cardiovascular exam     Neuro/Psych negative neurological ROS  negative psych ROS   GI/Hepatic negative GI ROS, Neg liver ROS,   Endo/Other  negative endocrine ROS  Renal/GU negative Renal ROS  negative genitourinary   Musculoskeletal negative musculoskeletal ROS (+)   Abdominal   Peds  Hematology negative hematology ROS (+)   Anesthesia Other Findings   Reproductive/Obstetrics                           Anesthesia Physical Anesthesia Plan  ASA: II  Anesthesia Plan: General   Post-op Pain Management: GA combined w/ Regional for post-op pain   Induction: Intravenous  PONV Risk Score and Plan: 2 and Ondansetron, Dexamethasone and Treatment may vary due to age or medical condition  Airway Management Planned: LMA  Additional Equipment: None  Intra-op Plan:   Post-operative Plan: Extubation in OR  Informed Consent: I have reviewed the patients History and Physical, chart, labs and discussed the procedure including the risks, benefits and alternatives for the proposed anesthesia with the patient or authorized representative who has indicated his/her understanding and acceptance.     Dental advisory given  Plan Discussed with:   Anesthesia Plan Comments:        Anesthesia Quick Evaluation

## 2019-05-09 ENCOUNTER — Encounter (HOSPITAL_COMMUNITY)
Admission: RE | Admit: 2019-05-09 | Discharge: 2019-05-09 | Disposition: A | Discharge status: Home / Self Care | Payer: Medicare Other | Source: Ambulatory Visit | Attending: Surgery | Admitting: Surgery

## 2019-05-09 ENCOUNTER — Other Ambulatory Visit: Payer: Self-pay

## 2019-05-09 ENCOUNTER — Observation Stay (HOSPITAL_COMMUNITY)
Admission: RE | Admit: 2019-05-09 | Discharge: 2019-05-10 | Disposition: A | Discharge status: Home / Self Care | Payer: Medicare Other | Attending: Surgery | Admitting: Surgery

## 2019-05-09 ENCOUNTER — Encounter (HOSPITAL_COMMUNITY)
Admission: RE | Disposition: A | Discharge status: Home / Self Care | Payer: Self-pay | Source: Home / Self Care | Attending: Surgery

## 2019-05-09 ENCOUNTER — Ambulatory Visit (HOSPITAL_COMMUNITY): Payer: Medicare Other | Admitting: Certified Registered Nurse Anesthetist

## 2019-05-09 ENCOUNTER — Encounter (HOSPITAL_COMMUNITY): Payer: Self-pay

## 2019-05-09 DIAGNOSIS — Z853 Personal history of malignant neoplasm of breast: Secondary | ICD-10-CM | POA: Insufficient documentation

## 2019-05-09 DIAGNOSIS — Z7982 Long term (current) use of aspirin: Secondary | ICD-10-CM | POA: Insufficient documentation

## 2019-05-09 DIAGNOSIS — Z91048 Other nonmedicinal substance allergy status: Secondary | ICD-10-CM | POA: Diagnosis not present

## 2019-05-09 DIAGNOSIS — Z170 Estrogen receptor positive status [ER+]: Secondary | ICD-10-CM | POA: Diagnosis not present

## 2019-05-09 DIAGNOSIS — C50912 Malignant neoplasm of unspecified site of left female breast: Secondary | ICD-10-CM | POA: Insufficient documentation

## 2019-05-09 DIAGNOSIS — Z79899 Other long term (current) drug therapy: Secondary | ICD-10-CM | POA: Insufficient documentation

## 2019-05-09 DIAGNOSIS — F17200 Nicotine dependence, unspecified, uncomplicated: Secondary | ICD-10-CM | POA: Diagnosis not present

## 2019-05-09 HISTORY — PX: MASTECTOMY W/ SENTINEL NODE BIOPSY: SHX2001

## 2019-05-09 SURGERY — MASTECTOMY WITH SENTINEL LYMPH NODE BIOPSY
Anesthesia: General | Site: Breast | Laterality: Left

## 2019-05-09 MED ORDER — POTASSIUM CHLORIDE IN NACL 20-0.9 MEQ/L-% IV SOLN
INTRAVENOUS | Status: DC
  Administered 2019-05-09 (×2): via INTRAVENOUS
  Filled 2019-05-09 (×2): qty 1000

## 2019-05-09 MED ORDER — GABAPENTIN 300 MG PO CAPS
300.0000 mg | ORAL_CAPSULE | ORAL | Status: AC
  Administered 2019-05-09: 300 mg via ORAL
  Filled 2019-05-09: qty 1

## 2019-05-09 MED ORDER — ONDANSETRON HCL 4 MG/2ML IJ SOLN: 4.0000 mg | Freq: Once | INTRAMUSCULAR | Status: DC | PRN

## 2019-05-09 MED ORDER — EPHEDRINE SULFATE-NACL 50-0.9 MG/10ML-% IV SOSY
PREFILLED_SYRINGE | INTRAVENOUS | Status: DC | PRN
  Administered 2019-05-09: 10 mg via INTRAVENOUS

## 2019-05-09 MED ORDER — METHYLENE BLUE 0.5 % INJ SOLN
INTRAVENOUS | Status: AC
  Filled 2019-05-09: qty 10

## 2019-05-09 MED ORDER — METHOCARBAMOL 500 MG PO TABS
ORAL_TABLET | ORAL | Status: AC
  Filled 2019-05-09: qty 1

## 2019-05-09 MED ORDER — OXYCODONE HCL 5 MG PO TABS
ORAL_TABLET | ORAL | Status: AC
  Filled 2019-05-09: qty 1

## 2019-05-09 MED ORDER — OXYCODONE HCL 5 MG PO TABS
5.0000 mg | ORAL_TABLET | ORAL | Status: DC | PRN
  Filled 2019-05-09: qty 2

## 2019-05-09 MED ORDER — ONDANSETRON HCL 4 MG/2ML IJ SOLN
INTRAMUSCULAR | Status: AC
  Filled 2019-05-09: qty 2

## 2019-05-09 MED ORDER — ENOXAPARIN SODIUM 40 MG/0.4ML ~~LOC~~ SOLN: 40.0000 mg | SUBCUTANEOUS | Status: DC

## 2019-05-09 MED ORDER — PROPOFOL 10 MG/ML IV BOLUS
INTRAVENOUS | Status: DC | PRN
  Administered 2019-05-09: 50 mg via INTRAVENOUS
  Administered 2019-05-09: 150 mg via INTRAVENOUS

## 2019-05-09 MED ORDER — TECHNETIUM TC 99M SULFUR COLLOID FILTERED
1.0000 | Freq: Once | INTRAVENOUS | Status: AC | PRN
  Administered 2019-05-09: 1 via INTRADERMAL

## 2019-05-09 MED ORDER — OXYCODONE HCL 5 MG/5ML PO SOLN: 5.0000 mg | Freq: Once | ORAL | Status: AC | PRN

## 2019-05-09 MED ORDER — FENTANYL CITRATE (PF) 100 MCG/2ML IJ SOLN: 25.0000 ug | INTRAMUSCULAR | Status: DC | PRN

## 2019-05-09 MED ORDER — CHLORHEXIDINE GLUCONATE CLOTH 2 % EX PADS: 6.0000 | MEDICATED_PAD | Freq: Once | CUTANEOUS | Status: DC

## 2019-05-09 MED ORDER — MIDAZOLAM HCL 2 MG/2ML IJ SOLN
INTRAMUSCULAR | Status: DC | PRN
  Administered 2019-05-09: 1 mg via INTRAVENOUS

## 2019-05-09 MED ORDER — LIDOCAINE 2% (20 MG/ML) 5 ML SYRINGE
INTRAMUSCULAR | Status: DC | PRN
  Administered 2019-05-09: 60 mg via INTRAVENOUS

## 2019-05-09 MED ORDER — 0.9 % SODIUM CHLORIDE (POUR BTL) OPTIME
TOPICAL | Status: DC | PRN
  Administered 2019-05-09: 1000 mL

## 2019-05-09 MED ORDER — ONDANSETRON HCL 4 MG/2ML IJ SOLN
INTRAMUSCULAR | Status: DC | PRN
  Administered 2019-05-09: 4 mg via INTRAVENOUS

## 2019-05-09 MED ORDER — LACTATED RINGERS IV SOLN
INTRAVENOUS | Status: DC | PRN
  Administered 2019-05-09 (×2): via INTRAVENOUS

## 2019-05-09 MED ORDER — MIDAZOLAM HCL 2 MG/2ML IJ SOLN
INTRAMUSCULAR | Status: AC
  Filled 2019-05-09: qty 2

## 2019-05-09 MED ORDER — DIPHENHYDRAMINE HCL 12.5 MG/5ML PO ELIX: 12.5000 mg | ORAL_SOLUTION | Freq: Four times a day (QID) | ORAL | Status: DC | PRN

## 2019-05-09 MED ORDER — FENTANYL CITRATE (PF) 250 MCG/5ML IJ SOLN
INTRAMUSCULAR | Status: AC
  Filled 2019-05-09: qty 5

## 2019-05-09 MED ORDER — FENTANYL CITRATE (PF) 250 MCG/5ML IJ SOLN
INTRAMUSCULAR | Status: DC | PRN
  Administered 2019-05-09 (×2): 25 ug via INTRAVENOUS
  Administered 2019-05-09: 50 ug via INTRAVENOUS
  Administered 2019-05-09: 25 ug via INTRAVENOUS
  Administered 2019-05-09: 50 ug via INTRAVENOUS

## 2019-05-09 MED ORDER — ONDANSETRON HCL 4 MG/2ML IJ SOLN: 4.0000 mg | Freq: Four times a day (QID) | INTRAMUSCULAR | Status: DC | PRN

## 2019-05-09 MED ORDER — GABAPENTIN 300 MG PO CAPS
300.0000 mg | ORAL_CAPSULE | Freq: Two times a day (BID) | ORAL | Status: DC
  Administered 2019-05-09 (×2): 300 mg via ORAL
  Filled 2019-05-09 (×2): qty 1

## 2019-05-09 MED ORDER — DEXAMETHASONE SODIUM PHOSPHATE 10 MG/ML IJ SOLN
INTRAMUSCULAR | Status: AC
  Filled 2019-05-09: qty 1

## 2019-05-09 MED ORDER — ACETAMINOPHEN 500 MG PO TABS
1000.0000 mg | ORAL_TABLET | ORAL | Status: AC
  Administered 2019-05-09: 1000 mg via ORAL
  Filled 2019-05-09: qty 2

## 2019-05-09 MED ORDER — DEXAMETHASONE SODIUM PHOSPHATE 10 MG/ML IJ SOLN
INTRAMUSCULAR | Status: DC | PRN
  Administered 2019-05-09: 10 mg via INTRAVENOUS

## 2019-05-09 MED ORDER — OXYCODONE HCL 5 MG PO TABS
5.0000 mg | ORAL_TABLET | Freq: Once | ORAL | Status: AC | PRN
  Administered 2019-05-09: 5 mg via ORAL

## 2019-05-09 MED ORDER — CEFAZOLIN SODIUM-DEXTROSE 2-4 GM/100ML-% IV SOLN
2.0000 g | INTRAVENOUS | Status: AC
  Administered 2019-05-09: 2 g via INTRAVENOUS
  Filled 2019-05-09: qty 100

## 2019-05-09 MED ORDER — MORPHINE SULFATE (PF) 2 MG/ML IV SOLN: 1.0000 mg | INTRAVENOUS | Status: DC | PRN

## 2019-05-09 MED ORDER — DIPHENHYDRAMINE HCL 50 MG/ML IJ SOLN: 12.5000 mg | Freq: Four times a day (QID) | INTRAMUSCULAR | Status: DC | PRN

## 2019-05-09 MED ORDER — METHOCARBAMOL 500 MG PO TABS
500.0000 mg | ORAL_TABLET | Freq: Four times a day (QID) | ORAL | Status: DC | PRN
  Administered 2019-05-09: 500 mg via ORAL

## 2019-05-09 MED ORDER — LIDOCAINE 2% (20 MG/ML) 5 ML SYRINGE
INTRAMUSCULAR | Status: AC
  Filled 2019-05-09: qty 5

## 2019-05-09 MED ORDER — TRAMADOL HCL 50 MG PO TABS: 50.0000 mg | ORAL_TABLET | Freq: Four times a day (QID) | ORAL | Status: DC | PRN

## 2019-05-09 MED ORDER — ONDANSETRON 4 MG PO TBDP: 4.0000 mg | ORAL_TABLET | Freq: Four times a day (QID) | ORAL | Status: DC | PRN

## 2019-05-09 SURGICAL SUPPLY — 53 items
ADH SKN CLS APL DERMABOND .7 (GAUZE/BANDAGES/DRESSINGS) ×1
APPLIER CLIP 9.375 MED OPEN (MISCELLANEOUS) ×3
APR CLP MED 9.3 20 MLT OPN (MISCELLANEOUS) ×1
BINDER BREAST LRG (GAUZE/BANDAGES/DRESSINGS) ×2 IMPLANT
BINDER BREAST XLRG (GAUZE/BANDAGES/DRESSINGS) IMPLANT
BIOPATCH RED 1 DISK 7.0 (GAUZE/BANDAGES/DRESSINGS) ×3 IMPLANT
BIOPATCH RED 1IN DISK 7.0MM (GAUZE/BANDAGES/DRESSINGS) ×2
CANISTER SUCT 3000ML PPV (MISCELLANEOUS) ×3 IMPLANT
CHLORAPREP W/TINT 26ML (MISCELLANEOUS) ×3 IMPLANT
CLIP APPLIE 9.375 MED OPEN (MISCELLANEOUS) ×1 IMPLANT
CONT SPEC 4OZ CLIKSEAL STRL BL (MISCELLANEOUS) ×3 IMPLANT
COVER PROBE W GEL 5X96 (DRAPES) ×3 IMPLANT
COVER SURGICAL LIGHT HANDLE (MISCELLANEOUS) ×3 IMPLANT
COVER WAND RF STERILE (DRAPES) ×3 IMPLANT
DERMABOND ADVANCED (GAUZE/BANDAGES/DRESSINGS) ×2
DERMABOND ADVANCED .7 DNX12 (GAUZE/BANDAGES/DRESSINGS) ×1 IMPLANT
DRAIN CHANNEL 19F RND (DRAIN) ×3 IMPLANT
DRAPE CHEST BREAST 15X10 FENES (DRAPES) ×3 IMPLANT
DRAPE HALF SHEET 40X57 (DRAPES) ×2 IMPLANT
DRSG PAD ABDOMINAL 8X10 ST (GAUZE/BANDAGES/DRESSINGS) ×6 IMPLANT
DRSG TEGADERM 4X4.75 (GAUZE/BANDAGES/DRESSINGS) ×3 IMPLANT
ELECT REM PT RETURN 9FT ADLT (ELECTROSURGICAL) ×3
ELECTRODE REM PT RTRN 9FT ADLT (ELECTROSURGICAL) ×1 IMPLANT
EVACUATOR SILICONE 100CC (DRAIN) ×3 IMPLANT
GAUZE SPONGE 4X4 12PLY STRL (GAUZE/BANDAGES/DRESSINGS) ×3 IMPLANT
GAUZE SPONGE 4X4 12PLY STRL LF (GAUZE/BANDAGES/DRESSINGS) ×2 IMPLANT
GLOVE SURG SIGNA 7.5 PF LTX (GLOVE) ×3 IMPLANT
GOWN STRL REUS W/ TWL LRG LVL3 (GOWN DISPOSABLE) ×1 IMPLANT
GOWN STRL REUS W/ TWL XL LVL3 (GOWN DISPOSABLE) ×1 IMPLANT
GOWN STRL REUS W/TWL LRG LVL3 (GOWN DISPOSABLE) ×3
GOWN STRL REUS W/TWL XL LVL3 (GOWN DISPOSABLE) ×3
KIT BASIN OR (CUSTOM PROCEDURE TRAY) ×3 IMPLANT
KIT TURNOVER KIT B (KITS) ×3 IMPLANT
NDL 18GX1X1/2 (RX/OR ONLY) (NEEDLE) IMPLANT
NDL FILTER BLUNT 18X1 1/2 (NEEDLE) IMPLANT
NDL HYPO 25GX1X1/2 BEV (NEEDLE) IMPLANT
NEEDLE 18GX1X1/2 (RX/OR ONLY) (NEEDLE) IMPLANT
NEEDLE FILTER BLUNT 18X 1/2SAF (NEEDLE)
NEEDLE FILTER BLUNT 18X1 1/2 (NEEDLE) IMPLANT
NEEDLE HYPO 25GX1X1/2 BEV (NEEDLE) IMPLANT
NS IRRIG 1000ML POUR BTL (IV SOLUTION) ×3 IMPLANT
PACK GENERAL/GYN (CUSTOM PROCEDURE TRAY) ×3 IMPLANT
PAD ABD 7.5X8 STRL (GAUZE/BANDAGES/DRESSINGS) ×2 IMPLANT
PAD ARMBOARD 7.5X6 YLW CONV (MISCELLANEOUS) ×3 IMPLANT
PENCIL SMOKE EVACUATOR (MISCELLANEOUS) ×3 IMPLANT
SPECIMEN JAR X LARGE (MISCELLANEOUS) ×3 IMPLANT
SUT ETHILON 2 0 FS 18 (SUTURE) ×3 IMPLANT
SUT MON AB 4-0 PC3 18 (SUTURE) ×3 IMPLANT
SUT SILK 2 0 SH (SUTURE) ×3 IMPLANT
SUT VIC AB 3-0 SH 18 (SUTURE) ×5 IMPLANT
SYR CONTROL 10ML LL (SYRINGE) IMPLANT
TOWEL OR 17X24 6PK STRL BLUE (TOWEL DISPOSABLE) ×3 IMPLANT
TOWEL OR 17X26 10 PK STRL BLUE (TOWEL DISPOSABLE) ×3 IMPLANT

## 2019-05-09 NOTE — Anesthesia Postprocedure Evaluation (Signed)
Anesthesia Post Note  Patient: Fidencia Mccloud  Procedure(s) Performed: LEFT MASTECTOMY WITH LEFT AXILLARY SENTINEL LYMPH NODE BIOPSY (Left Breast)     Patient location during evaluation: PACU Anesthesia Type: General Level of consciousness: awake and alert Pain management: pain level controlled Vital Signs Assessment: post-procedure vital signs reviewed and stable Respiratory status: spontaneous breathing, nonlabored ventilation and respiratory function stable Cardiovascular status: blood pressure returned to baseline and stable Postop Assessment: no apparent nausea or vomiting Anesthetic complications: no    Last Vitals:  Vitals:   05/09/19 0935 05/09/19 1027  BP: (!) 129/55 (!) 140/54  Pulse: 68 65  Resp: 15   Temp:    SpO2: 98% 98%    Last Pain:  Vitals:   05/09/19 1027  TempSrc:   PainSc: 3                  Lidia Collum

## 2019-05-09 NOTE — Interval H&P Note (Signed)
History and Physical Interval Note:no change in H and P  05/09/2019 7:10 AM  Jessica Harrell  has presented today for surgery, with the diagnosis of LEFT BREAST CANCER.  The various methods of treatment have been discussed with the patient and family. After consideration of risks, benefits and other options for treatment, the patient has consented to  Procedure(s): LEFT MASTECTOMY WITH LEFT AXILLARY SENTINEL LYMPH NODE BIOPSY (Left) as a surgical intervention.  The patient's history has been reviewed, patient examined, no change in status, stable for surgery.  I have reviewed the patient's chart and labs.  Questions were answered to the patient's satisfaction.     Coralie Keens

## 2019-05-09 NOTE — Transfer of Care (Signed)
Immediate Anesthesia Transfer of Care Note  Patient: Jessica Harrell  Procedure(s) Performed: LEFT MASTECTOMY WITH LEFT AXILLARY SENTINEL LYMPH NODE BIOPSY (Left Breast)  Patient Location: PACU  Anesthesia Type:General  Level of Consciousness: drowsy  Airway & Oxygen Therapy: Patient Spontanous Breathing and Patient connected to face mask oxygen  Post-op Assessment: Report given to RN and Post -op Vital signs reviewed and stable  Post vital signs: Reviewed and stable  Last Vitals:  Vitals Value Taken Time  BP 147/66 05/09/2019  9:05 AM  Temp    Pulse 75 05/09/2019  9:06 AM  Resp 16 05/09/2019  9:06 AM  SpO2 98 % 05/09/2019  9:06 AM  Vitals shown include unvalidated device data.  Last Pain:  Vitals:   05/09/19 0604  TempSrc:   PainSc: 0-No pain         Complications: No apparent anesthesia complications

## 2019-05-09 NOTE — Anesthesia Procedure Notes (Signed)
Anesthesia Regional Block: Pectoralis block   Pre-Anesthetic Checklist: ,, timeout performed, Correct Patient, Correct Site, Correct Laterality, Correct Procedure, Correct Position, site marked, Risks and benefits discussed,  Surgical consent,  Pre-op evaluation,  At surgeon's request and post-op pain management  Laterality: Left  Prep: chloraprep       Needles:  Injection technique: Single-shot  Needle Type: Echogenic Needle     Needle Length: 9cm  Needle Gauge: 21     Additional Needles:   Narrative:  Start time: 05/09/2019 7:05 AM End time: 05/09/2019 7:15 AM Injection made incrementally with aspirations every 5 mL.  Performed by: Personally  Anesthesiologist: Roberts Gaudy, MD  Additional Notes: 30 cc 0.75% Bupivacaine with 1:200 epi injected easily

## 2019-05-09 NOTE — Op Note (Signed)
LEFT MASTECTOMY WITH LEFT AXILLARY SENTINEL LYMPH NODE BIOPSY  Procedure Note  Elliette Seabolt 05/09/2019   Pre-op Diagnosis: LEFT BREAST CANCER     Post-op Diagnosis: same  Procedure(s): LEFT MASTECTOMY WITH DEEP LEFT AXILLARY SENTINEL LYMPH NODE BIOPSY  Surgeon(s): Coralie Keens, MD  Anesthesia: General  Staff:  Circulator: Gar Ponto, RN Scrub Person: Candi Leash, RN; Rico Ala  Estimated Blood Loss: Minimal               Specimens: sent to path  Indications: This is a 78 year old female with a history of a large left breast cancer.  She has been going through neoadjuvant therapy with oral anti-hormonal medications.  She has had a slow decrease in large cancer.  It was still not amenable to just a lumpectomy and mastectomy was recommended.  She elected to forego reconstruction.  Procedure: The patient was identified in the holding area the correct patient.  Radioactive tope was directed around the areola by the radiation technologist.  The patient was taken to the operating room.  She is placed upon the operating table general anesthesia was induced.  Her left chest, breast, and axilla were then prepped and draped in usual sterile fashion.  She had a firm palpable mass in the lower inner quadrant of the left breast.  A performed elliptical incision on the chest incorporating the nipple areolar complex and the large mass with a scalpel going medial to lateral.  I then dissected down to the breast tissue circumferentially with electrocautery.  I then created the superior skin flap dissecting the breast tissue from the overlying skin and dermis.  I took the dissection down to the chest wall and up towards the clavicle.  I then dissected out the inferior flap going down to the inframammary ridge.  I removed all breast tissue over the top of the palpable mass including some skin.  I then took this down around toward the axilla.  I then slowly dissected the  breast off of the chest wall moving medial to lateral with the electrocautery staying just off of the muscle.  Once I reached the axilla, the neoprobe was used to identify the sentinel lymph nodes.  Multiple lymph nodes were identified with increased uptake of radioactivity.  There were no enlarged nodes.  The multiple lymph nodes were removed and sent together the pathology for evaluation.  The nodal basin appeared clean with the neoprobe.  At this point the mastectomy specimen was completely removed.  I marked the lateral aspect with a suture.  It was then sent to pathology for evaluation.  I then thoroughly irrigated the incision and flaps with saline.  Hemostasis appeared to be achieved.  I made a separate skin incision and placed a 19 [place] Blake drain into the incision.  This was secured in place with a nylon suture.  I then closed the patient subcutaneous tissue with interrupted 3-0 Vicryl sutures.  The skin was then closed with running 4-0 Monocryl and Dermabond.  A binder was then placed.  The patient tolerated the procedure well.  All the counts were correct at the end of the procedure.  The patient was then extubated in the operating room and taken in a stable condition to the recovery room.          Coralie Keens   Date: 05/09/2019  Time: 8:55 AM

## 2019-05-09 NOTE — Anesthesia Procedure Notes (Signed)
Procedure Name: LMA Insertion Date/Time: 05/09/2019 7:44 AM Performed by: Bryson Corona, CRNA Pre-anesthesia Checklist: Patient identified, Emergency Drugs available, Suction available and Patient being monitored Patient Re-evaluated:Patient Re-evaluated prior to induction Oxygen Delivery Method: Circle System Utilized Preoxygenation: Pre-oxygenation with 100% oxygen Induction Type: IV induction LMA: LMA inserted LMA Size: 4.0 Number of attempts: 1 Airway Equipment and Method: Bite block Placement Confirmation: positive ETCO2 Tube secured with: Tape Dental Injury: Teeth and Oropharynx as per pre-operative assessment

## 2019-05-10 ENCOUNTER — Encounter (HOSPITAL_COMMUNITY): Payer: Self-pay | Admitting: Surgery

## 2019-05-10 DIAGNOSIS — C50912 Malignant neoplasm of unspecified site of left female breast: Secondary | ICD-10-CM | POA: Diagnosis not present

## 2019-05-10 LAB — CBC
HCT: 38.2 % (ref 36.0–46.0)
Hemoglobin: 12.7 g/dL (ref 12.0–15.0)
MCH: 31.4 pg (ref 26.0–34.0)
MCHC: 33.2 g/dL (ref 30.0–36.0)
MCV: 94.3 fL (ref 80.0–100.0)
Platelets: 220 10*3/uL (ref 150–400)
RBC: 4.05 MIL/uL (ref 3.87–5.11)
RDW: 12.6 % (ref 11.5–15.5)
WBC: 11.9 10*3/uL — ABNORMAL HIGH (ref 4.0–10.5)
nRBC: 0 % (ref 0.0–0.2)

## 2019-05-10 MED ORDER — TRAMADOL HCL 50 MG PO TABS: 50.0000 mg | ORAL_TABLET | Freq: Four times a day (QID) | ORAL | 0 refills | Status: DC | PRN

## 2019-05-10 NOTE — Discharge Instructions (Signed)
[facility] [phone]  MASTECTOMY: POST OP INSTRUCTIONS  Always review your discharge instruction sheet given to you by the facility where your surgery was performed. IF YOU HAVE DISABILITY OR FAMILY LEAVE FORMS, YOU MUST BRING THEM TO THE OFFICE FOR PROCESSING.   DO NOT GIVE THEM TO YOUR DOCTOR. A prescription for pain medication may be given to you upon discharge.  Take your pain medication as prescribed, if needed.  If narcotic pain medicine is not needed, then you may take acetaminophen (Tylenol) or ibuprofen (Advil) as needed. 1. Take your usually prescribed medications unless otherwise directed. 2. If you need a refill on your pain medication, please contact your pharmacy.  They will contact our office to request authorization.  Prescriptions will not be filled after 5pm or on week-ends. 3. You should follow a light diet the first few days after arrival home, such as soup and crackers, etc.  Resume your normal diet the day after surgery. 4. Most patients will experience some swelling and bruising on the chest and underarm.  Ice packs will help.  Swelling and bruising can take several days to resolve.  5. It is common to experience some constipation if taking pain medication after surgery.  Increasing fluid intake and taking a stool softener (such as Colace) will usually help or prevent this problem from occurring.  A mild laxative (Milk of Magnesia or Miralax) should be taken according to package instructions if there are no bowel movements after 48 hours. 6. Unless discharge instructions indicate otherwise, leave your bandage dry and in place until your next appointment in 3-5 days.  You may take a limited sponge bath.  No tube baths or showers until the drains are removed.  You may have steri-strips (small skin tapes) in place directly over the incision.  These strips should be left on the skin for 7-10 days.  If your surgeon used skin glue on the incision, you may  shower in 24 hours.  The glue will flake off over the next 2-3 weeks.  Any sutures or staples will be removed at the office during your follow-up visit. 7. DRAINS:  If you have drains in place, it is important to keep a list of the amount of drainage produced each day in your drains.  Before leaving the hospital, you should be instructed on drain care.  Call our office if you have any questions about your drains. 8. ACTIVITIES:  You may resume regular (light) daily activities beginning the next day--such as daily self-care, walking, climbing stairs--gradually increasing activities as tolerated.  You may have sexual intercourse when it is comfortable.  Refrain from any heavy lifting or straining until approved by your doctor. a. You may drive when you are no longer taking prescription pain medication, you can comfortably wear a seatbelt, and you can safely maneuver your car and apply brakes. b. RETURN TO WORK:  __________________________________________________________ 9. You should see your doctor in the office for a follow-up appointment approximately 3-5 days after your surgery.  Your doctors nurse will typically make your follow-up appointment when she calls you with your pathology report.  Expect your pathology report 2-3 business days after your surgery.  You may call to check if you do not hear from [place] after three days.   10. OTHER INSTRUCTIONS:OK TO SHOWER STARTING TODAY.  WEAR BINDER FOR COMFORT. 11. TYLENOL/IBUPROFEN ALSO FOR PAIN ______________________________________________________________________________________________ ____________________________________________________________________________________________ WHEN TO CALL YOUR DOCTOR: 1. Fever over 101.0 2. Nausea and/or vomiting 3. Extreme swelling or bruising  4. Continued bleeding from incision. 5. Increased pain, redness, or drainage from the incision. The clinic staff is available to answer your questions during regular business  hours.  Please dont hesitate to call and ask to speak to one of the nurses for clinical concerns.  If you have a medical emergency, go to the nearest emergency room or call 911.  A surgeon from [facility] is always on call at the hospital. [address] [place], [place], [place]  [address] ? [address], [place], [place]   [address] [phone] ? [phone] ? FAX [phone] Web site: [email or web address]

## 2019-05-10 NOTE — Progress Notes (Signed)
Patient ID: Jessica Harrell, female   DOB: 1941/10/17, 78 y.o.   MRN: [id number]   Doing well Pain controlled Flaps viable Drain serosang  Plan: Discharge home

## 2019-05-10 NOTE — Discharge Summary (Signed)
Physician Discharge Summary  Patient ID: Jessica Harrell MRN: [id number] DOB/AGE: 76-May-1942 76 y.o.  Admit date: 05/09/2019 Discharge date: 05/10/2019  Admission Diagnoses:  Discharge Diagnoses:  Active Problems:   History of left breast cancer   Discharged Condition: good  Hospital Course: Uneventful post op recovery.  Discharged home POD#1 doing well  Consults: None  Significant Diagnostic Studies:   Treatments: surgery: left mastectomy with sentinel node biopsy  Discharge Exam: Blood pressure (!) 119/52, pulse 74, temperature 97.9 F (36.6 C), temperature source Oral, resp. rate 18, height 5\' 7"  (1.702 m), weight 64.9 kg, SpO2 100 %. General appearance: alert, cooperative and no distress Resp: clear to auscultation bilaterally Cardio: regular rate and rhythm, S1, S2 normal, no murmur, click, rub or gallop Incision/Wound:left chest incision clean, flaps viable, drain serosang  Disposition: Discharge disposition: 01-Home or Self Care        Allergies as of 05/10/2019      Reactions   Atorvastatin Other (See Comments)   Muscle pain       Medication List    TAKE these medications   aspirin EC 81 MG tablet Take 81 mg by mouth daily.   cholecalciferol 1000 units tablet Commonly known as:  VITAMIN D Take 1,000 Units by mouth daily.   letrozole 2.5 MG tablet Commonly known as:  FEMARA TAKE 1 TABLET ONCE DAILY.   RED YEAST RICE PO Take by mouth.   traMADol 50 MG tablet Commonly known as:  ULTRAM Take 1 tablet (50 mg total) by mouth every 6 (six) hours as needed for moderate pain or severe pain.      Follow-up Information    Coralie Keens, MD. Schedule an appointment as soon as possible for a visit on 05/19/2019.   Specialty:  General Surgery Why:  call office to make appointment for 6/12 for possible drain removal Contact information: [address] [place] [address] [phone]           Signed: Coralie Keens 05/10/2019, 8:03 AM

## 2019-05-10 NOTE — Progress Notes (Signed)
[name] to be D/C'd  per MD order. Discussed with the patient and all questions fully answered.  VSS, Skin clean, dry and intact without evidence of skin break down, no evidence of skin tears noted.  IV catheter discontinued intact. Site without signs and symptoms of complications. Dressing and pressure applied.  An After Visit Summary was printed and given to the patient. Patient received prescription.  D/c education completed with patient/family including follow up instructions, medication list, d/c activities limitations if indicated, with other d/c instructions as indicated by MD - patient able to verbalize understanding, all questions fully answered.   Patient instructed to return to ED, call 911, or call MD for any changes in condition.   Pt provide with Jackson-Pratt drain education.  Patient to be escorted via [place], and D/C home via private auto.

## 2019-05-15 ENCOUNTER — Emergency Department (HOSPITAL_COMMUNITY): Payer: Medicare Other

## 2019-05-15 ENCOUNTER — Inpatient Hospital Stay (HOSPITAL_COMMUNITY)
Admission: EM | Admit: 2019-05-15 | Discharge: 2019-05-20 | DRG: 064 | Disposition: A | Discharge status: Skilled Nursing Facility | Payer: Medicare Other | Attending: Internal Medicine | Admitting: Internal Medicine

## 2019-05-15 ENCOUNTER — Encounter (HOSPITAL_COMMUNITY): Payer: Self-pay

## 2019-05-15 ENCOUNTER — Other Ambulatory Visit: Payer: Self-pay

## 2019-05-15 DIAGNOSIS — Q211 Atrial septal defect: Secondary | ICD-10-CM

## 2019-05-15 DIAGNOSIS — M7989 Other specified soft tissue disorders: Secondary | ICD-10-CM | POA: Diagnosis not present

## 2019-05-15 DIAGNOSIS — T8130XA Disruption of wound, unspecified, initial encounter: Secondary | ICD-10-CM | POA: Diagnosis present

## 2019-05-15 DIAGNOSIS — J449 Chronic obstructive pulmonary disease, unspecified: Secondary | ICD-10-CM | POA: Diagnosis present

## 2019-05-15 DIAGNOSIS — G9341 Metabolic encephalopathy: Secondary | ICD-10-CM | POA: Diagnosis not present

## 2019-05-15 DIAGNOSIS — I639 Cerebral infarction, unspecified: Secondary | ICD-10-CM | POA: Diagnosis present

## 2019-05-15 DIAGNOSIS — R29709 NIHSS score 9: Secondary | ICD-10-CM | POA: Diagnosis present

## 2019-05-15 DIAGNOSIS — I6340 Cerebral infarction due to embolism of unspecified cerebral artery: Principal | ICD-10-CM | POA: Diagnosis present

## 2019-05-15 DIAGNOSIS — R29810 Facial weakness: Secondary | ICD-10-CM | POA: Diagnosis present

## 2019-05-15 DIAGNOSIS — Z7982 Long term (current) use of aspirin: Secondary | ICD-10-CM | POA: Diagnosis not present

## 2019-05-15 DIAGNOSIS — R627 Adult failure to thrive: Secondary | ICD-10-CM | POA: Diagnosis not present

## 2019-05-15 DIAGNOSIS — Z9012 Acquired absence of left breast and nipple: Secondary | ICD-10-CM

## 2019-05-15 DIAGNOSIS — E872 Acidosis: Secondary | ICD-10-CM | POA: Diagnosis present

## 2019-05-15 DIAGNOSIS — I10 Essential (primary) hypertension: Secondary | ICD-10-CM

## 2019-05-15 DIAGNOSIS — Z20828 Contact with and (suspected) exposure to other viral communicable diseases: Secondary | ICD-10-CM | POA: Diagnosis present

## 2019-05-15 DIAGNOSIS — H547 Unspecified visual loss: Secondary | ICD-10-CM | POA: Diagnosis present

## 2019-05-15 DIAGNOSIS — Z853 Personal history of malignant neoplasm of breast: Secondary | ICD-10-CM

## 2019-05-15 DIAGNOSIS — Z9221 Personal history of antineoplastic chemotherapy: Secondary | ICD-10-CM

## 2019-05-15 DIAGNOSIS — F17210 Nicotine dependence, cigarettes, uncomplicated: Secondary | ICD-10-CM | POA: Diagnosis present

## 2019-05-15 DIAGNOSIS — E860 Dehydration: Secondary | ICD-10-CM | POA: Diagnosis not present

## 2019-05-15 DIAGNOSIS — I672 Cerebral atherosclerosis: Secondary | ICD-10-CM | POA: Diagnosis present

## 2019-05-15 DIAGNOSIS — H47611 Cortical blindness, right side of brain: Secondary | ICD-10-CM | POA: Diagnosis present

## 2019-05-15 DIAGNOSIS — Z807 Family history of other malignant neoplasms of lymphoid, hematopoietic and related tissues: Secondary | ICD-10-CM

## 2019-05-15 DIAGNOSIS — R4701 Aphasia: Secondary | ICD-10-CM | POA: Diagnosis present

## 2019-05-15 DIAGNOSIS — I700 Atherosclerosis of aorta: Secondary | ICD-10-CM | POA: Diagnosis present

## 2019-05-15 DIAGNOSIS — I6389 Other cerebral infarction: Secondary | ICD-10-CM | POA: Diagnosis not present

## 2019-05-15 DIAGNOSIS — H47612 Cortical blindness, left side of brain: Secondary | ICD-10-CM | POA: Diagnosis present

## 2019-05-15 DIAGNOSIS — Z888 Allergy status to other drugs, medicaments and biological substances status: Secondary | ICD-10-CM | POA: Diagnosis not present

## 2019-05-15 DIAGNOSIS — G9340 Encephalopathy, unspecified: Secondary | ICD-10-CM | POA: Insufficient documentation

## 2019-05-15 DIAGNOSIS — R1310 Dysphagia, unspecified: Secondary | ICD-10-CM | POA: Diagnosis present

## 2019-05-15 DIAGNOSIS — R471 Dysarthria and anarthria: Secondary | ICD-10-CM | POA: Diagnosis present

## 2019-05-15 DIAGNOSIS — R52 Pain, unspecified: Secondary | ICD-10-CM | POA: Diagnosis not present

## 2019-05-15 DIAGNOSIS — Z8249 Family history of ischemic heart disease and other diseases of the circulatory system: Secondary | ICD-10-CM | POA: Diagnosis not present

## 2019-05-15 LAB — URINALYSIS, ROUTINE W REFLEX MICROSCOPIC
Bacteria, UA: NONE SEEN
Bilirubin Urine: NEGATIVE
Glucose, UA: 50 mg/dL — AB
Ketones, ur: 80 mg/dL — AB
Leukocytes,Ua: NEGATIVE
Nitrite: NEGATIVE
Protein, ur: 30 mg/dL — AB
Specific Gravity, Urine: 1.018 (ref 1.005–1.030)
pH: 6 (ref 5.0–8.0)

## 2019-05-15 LAB — CBC WITH DIFFERENTIAL/PLATELET
Abs Immature Granulocytes: 0.07 10*3/uL (ref 0.00–0.07)
Basophils Absolute: 0 10*3/uL (ref 0.0–0.1)
Basophils Relative: 0 %
Eosinophils Absolute: 0 10*3/uL (ref 0.0–0.5)
Eosinophils Relative: 0 %
HCT: 43.6 % (ref 36.0–46.0)
Hemoglobin: 14.7 g/dL (ref 12.0–15.0)
Immature Granulocytes: 1 %
Lymphocytes Relative: 7 %
Lymphs Abs: 0.8 10*3/uL (ref 0.7–4.0)
MCH: 31.7 pg (ref 26.0–34.0)
MCHC: 33.7 g/dL (ref 30.0–36.0)
MCV: 94 fL (ref 80.0–100.0)
Monocytes Absolute: 0.4 10*3/uL (ref 0.1–1.0)
Monocytes Relative: 3 %
Neutro Abs: 11.5 10*3/uL — ABNORMAL HIGH (ref 1.7–7.7)
Neutrophils Relative %: 89 %
Platelets: 191 10*3/uL (ref 150–400)
RBC: 4.64 MIL/uL (ref 3.87–5.11)
RDW: 12.4 % (ref 11.5–15.5)
WBC: 12.8 10*3/uL — ABNORMAL HIGH (ref 4.0–10.5)
nRBC: 0 % (ref 0.0–0.2)

## 2019-05-15 LAB — COMPREHENSIVE METABOLIC PANEL
ALT: 16 U/L (ref 0–44)
AST: 20 U/L (ref 15–41)
Albumin: 3.7 g/dL (ref 3.5–5.0)
Alkaline Phosphatase: 58 U/L (ref 38–126)
Anion gap: 18 — ABNORMAL HIGH (ref 5–15)
BUN: 16 mg/dL (ref 8–23)
CO2: 20 mmol/L — ABNORMAL LOW (ref 22–32)
Calcium: 9.2 mg/dL (ref 8.9–10.3)
Chloride: 95 mmol/L — ABNORMAL LOW (ref 98–111)
Creatinine, Ser: 0.69 mg/dL (ref 0.44–1.00)
GFR calc Af Amer: >60 mL/min (ref >60)
GFR calc non Af Amer: >60 mL/min (ref >60)
Glucose, Bld: 159 mg/dL — ABNORMAL HIGH (ref 70–99)
Potassium: 3.9 mmol/L (ref 3.5–5.1)
Sodium: 133 mmol/L — ABNORMAL LOW (ref 135–145)
Total Bilirubin: 1.2 mg/dL (ref 0.3–1.2)
Total Protein: 7.2 g/dL (ref 6.5–8.1)

## 2019-05-15 LAB — PROTIME-INR
INR: 0.9 (ref 0.8–1.2)
Prothrombin Time: 12.4 seconds (ref 11.4–15.2)

## 2019-05-15 LAB — SARS CORONAVIRUS 2 BY RT PCR (HOSPITAL ORDER, PERFORMED IN ~~LOC~~ HOSPITAL LAB): SARS Coronavirus 2: NEGATIVE

## 2019-05-15 LAB — LACTIC ACID, PLASMA
Lactic Acid, Venous: 2 mmol/L (ref 0.5–1.9)
Lactic Acid, Venous: 4.4 mmol/L (ref 0.5–1.9)

## 2019-05-15 LAB — LIPASE, BLOOD: Lipase: 25 U/L (ref 11–51)

## 2019-05-15 LAB — TROPONIN I: Troponin I: <0.03 ng/mL (ref <0.03)

## 2019-05-15 MED ORDER — SODIUM CHLORIDE 0.9 % IV BOLUS
500.0000 mL | Freq: Once | INTRAVENOUS | Status: AC
  Administered 2019-05-15: 500 mL via INTRAVENOUS

## 2019-05-15 MED ORDER — STROKE: EARLY STAGES OF RECOVERY BOOK
Freq: Once | Status: DC
  Filled 2019-05-15 (×3): qty 1

## 2019-05-15 MED ORDER — ACETAMINOPHEN 650 MG RE SUPP: 650.0000 mg | RECTAL | Status: DC | PRN

## 2019-05-15 MED ORDER — ACETAMINOPHEN 160 MG/5ML PO SOLN: 650.0000 mg | ORAL | Status: DC | PRN

## 2019-05-15 MED ORDER — VITAMIN D 25 MCG (1000 UNIT) PO TABS
1000.0000 [IU] | ORAL_TABLET | Freq: Every day | ORAL | Status: DC
  Administered 2019-05-16 – 2019-05-20 (×5): 1000 [IU] via ORAL
  Filled 2019-05-15 (×5): qty 1

## 2019-05-15 MED ORDER — SENNOSIDES-DOCUSATE SODIUM 8.6-50 MG PO TABS: 1.0000 | ORAL_TABLET | Freq: Every evening | ORAL | Status: DC | PRN

## 2019-05-15 MED ORDER — SODIUM CHLORIDE 0.9 % IV BOLUS
1000.0000 mL | Freq: Once | INTRAVENOUS | Status: AC
  Administered 2019-05-15: 1000 mL via INTRAVENOUS

## 2019-05-15 MED ORDER — SODIUM CHLORIDE 0.9 % IV SOLN
INTRAVENOUS | Status: AC
  Administered 2019-05-15: 19:00:00 via INTRAVENOUS

## 2019-05-15 MED ORDER — SODIUM CHLORIDE 0.9 % IV SOLN
INTRAVENOUS | Status: DC
  Administered 2019-05-16 – 2019-05-19 (×5): via INTRAVENOUS

## 2019-05-15 MED ORDER — VITAMIN D 1000 UNITS PO TABS: 1000.0000 [IU] | ORAL_TABLET | Freq: Every day | ORAL | Status: DC

## 2019-05-15 MED ORDER — SODIUM CHLORIDE 0.9 % IV SOLN
INTRAVENOUS | Status: DC
  Administered 2019-05-15: 12:00:00 via INTRAVENOUS

## 2019-05-15 MED ORDER — LETROZOLE 2.5 MG PO TABS
2.5000 mg | ORAL_TABLET | Freq: Every day | ORAL | Status: DC
  Administered 2019-05-16 – 2019-05-20 (×5): 2.5 mg via ORAL
  Filled 2019-05-15 (×6): qty 1

## 2019-05-15 MED ORDER — ASPIRIN EC 81 MG PO TBEC
81.0000 mg | DELAYED_RELEASE_TABLET | Freq: Every day | ORAL | Status: DC
  Administered 2019-05-16 – 2019-05-20 (×5): 81 mg via ORAL
  Filled 2019-05-15 (×5): qty 1

## 2019-05-15 MED ORDER — ACETAMINOPHEN 325 MG PO TABS: 650.0000 mg | ORAL_TABLET | ORAL | Status: DC | PRN

## 2019-05-15 MED ORDER — ENOXAPARIN SODIUM 40 MG/0.4ML ~~LOC~~ SOLN
40.0000 mg | SUBCUTANEOUS | Status: DC
  Administered 2019-05-15 – 2019-05-19 (×5): 40 mg via SUBCUTANEOUS
  Filled 2019-05-15 (×5): qty 0.4

## 2019-05-15 NOTE — ED Notes (Signed)
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

## 2019-05-15 NOTE — ED Notes (Signed)
CRITICAL VALUE ALERT  Critical Value:  Lactic 2.0  Date & Time Notied:  0945  Provider Notified: [email or web address] Orders Received/Actions taken:

## 2019-05-15 NOTE — ED Notes (Signed)
Date and time results received: 05/15/19 11:37 AM  (use smartphrase ".now" to insert current time)  Test: Lactic Critical Value: 4.4  Name of Provider Notified: Thurnell Garbe  Orders Received? Or Actions Taken?: Orders Received - See Orders for details

## 2019-05-15 NOTE — ED Notes (Signed)
[facility] arrived before second swallow eval could be performed

## 2019-05-15 NOTE — ED Triage Notes (Signed)
Pt had left mastectomy last Tuesday and discharged home Wednesday. Reported to EMS that pt did well for 2 days, but has been in bed since Saturday. Pt with poor po intake . Left breast incision intact. Serous drainage in bulb drainage

## 2019-05-15 NOTE — ED Notes (Signed)
Pt found half off and half on the end of bed, pt very confused, had pulled dsg to incision off, incision opened more than before my shift. Pt had pulled leads off again.  Safety sitter at bedside.

## 2019-05-15 NOTE — H&P (Addendum)
History and Physical  Jessica Harrell IWP:[id number] DOB: Apr 23, 1941 DOA: 05/15/2019   PCP: [name], MD   Patient coming from: Home  Chief Complaint: confusion, generalized weakness  HPI:  Jessica Harrell is a 78 y.o. female with medical history of left-sided breast cancer and tobacco abuse presenting with generalized weakness and confusion that began on 05/13/2019.  Patient was recently discharged from the hospital on 05/10/2019 after a left-sided mastectomy performed on 05/09/2019 by Dr. Coralie Keens.  The patient is unable to provide any history at this time secondary to her confusion.  History is obtained from review the medical record and speaking with the patient's spouse on the phone.  Apparently, the patient was doing fine until Saturday morning, 05/13/2019 when he noted the patient to be somewhat somnolent and tired.  She was arousable at that time and conversant.  He felt that she may have just had some lingering effects from the surgery and general anesthesia.  She slept most of the day on 05/13/2019.  In addition, he noted that the patient had an episode of emesis on 05/13/2019.  On 05/14/2019, the patient's noted the patient to have some confusion with generalized weakness.  Apparently, she required significant amount assistance to go to the bathroom.  However, her symptoms continue to worsen.  As result, EMS was activated and the patient was brought to the emergency department on 05/15/2019.  There were no reports of headache, chest pain, fevers, shortness of breath, continued vomiting, diarrhea, abdominal pain. In the emergency department, the patient was afebrile hemodynamically stable saturating 97% on room air.  She was mildly tachycardic with heart rate 100-110 in sinus rhythm.  BMP showed a sodium 133.  LFTs were unremarkable.  WBC was 12.8.  Lactic acid was 4.4.  Initial troponin was negative.  EKG shows sinus rhythm with nonspecific T wave changes.  CT of the brain showed  multifocal infarcts in the bilateral cerebellum and left occipital lobe.  In addition, the patient's left breast wound was also noted to be dehisced.  Because of the patient's large infarct and dehisced wound, the patient will be transferred to [facility] for further care.  Assessment/Plan: Acute ischemic stroke -CT brain as discussed above -I spoke with neurology, Dr. Leonel Ramsay, who will consult after pt arrives to [place] [facility] Consult -PT/OT evaluation -Speech therapy eval -CT brain-- -MRI brain-- -MRA brain-- -Carotid Duplex-- -Echo-- -LDL-- -HbA1C-- -Antiplatelet--ASA 81 mg  Acute metabolic encephalopathy -Secondary to stroke and dehydration, possible infectious process -Continue IV fluids -holding tramadol -UA neg for pyuria  Lactic acidosis -Patient does not appear clinically septic -Likely due to volume depletion -The patient is afebrile hemodynamically stable -Urinalysis negative for pyuria -Chest x-ray without consolidation--CXR findings likely represent 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 Gershon Crane) -please contact them and/or Dr. 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 Lindi Adie -continue letrazole            Past Medical History:  Diagnosis Date   Breast cancer [facility])    Right foot pain Nov. 2014   Past Surgical History:  Procedure Laterality Date   BREAST BIOPSY     CATARACT EXTRACTION     both eyes   EYE SURGERY  Left    MASTECTOMY W/ SENTINEL NODE BIOPSY Left 05/09/2019   Procedure: LEFT MASTECTOMY WITH LEFT AXILLARY SENTINEL LYMPH NODE BIOPSY;  Surgeon: Coralie Keens, MD;  Location: [place];  Service: General;  Laterality:  Left;   Social History:  reports that she has been smoking cigarettes. She has a 29.00 pack-year smoking history. She has never used smokeless tobacco. She reports current alcohol use of about 7.0 standard drinks of alcohol per week. She reports that she does not use drugs.   Family History  Problem Relation Age of Onset   Cancer Mother        esophageal   Hypertension Mother    Varicose Veins Mother    Cancer Sister        non hodgkins   Cancer Other        unknown     Allergies  Allergen Reactions   Atorvastatin Other (See Comments)    Muscle pain      Prior to Admission medications   Medication Sig Start Date End Date Taking? Authorizing Provider  aspirin EC 81 MG tablet Take 81 mg by mouth daily.    [provider]  cholecalciferol (VITAMIN D) 1000 units tablet Take 1,000 Units by mouth daily.    [provider]  letrozole (FEMARA) 2.5 MG tablet TAKE 1 TABLET ONCE DAILY. 04/19/19   Nicholas Lose, MD  Red Yeast Rice Extract (RED YEAST RICE PO) Take by mouth.    [provider]  traMADol (ULTRAM) 50 MG tablet Take 1 tablet (50 mg total) by mouth every 6 (six) hours as needed for moderate pain or severe pain. 05/10/19   Coralie Keens, MD    Review of Systems:  Unobtainable due to encephalopathy  Physical Exam: Vitals:   05/15/19 1211 05/15/19 1222 05/15/19 1330 05/15/19 1400  BP:   (!) 162/89 (!) 171/73  Pulse: (!) 103 (!) 109    Resp:    20  Temp:      TempSrc:      SpO2: 96% 97%    Weight:      Height:       General:  A&O x 1, NAD, nontoxic, pleasant/cooperative Head/Eye: No conjunctival hemorrhage, no icterus, Geiger/AT, No nystagmus ENT:  No icterus,  No thrush, good dentition, no pharyngeal exudate Neck:  No masses, no lymphadenpathy, no bruits CV:  RRR, no rub, no gallop, no S3 Lung:  Bibasilar crackles, no wheeze Abdomen: soft/NT, +BS, nondistended, no peritoneal signs Ext: No cyanosis, No rashes, No petechiae, No  lymphangitis, No edema Neuro: CNII-XII intact, strength 4/5 in bilateral upper and lower extremities, no dysmetria; R-gaze preference  Labs on Admission:  Basic Metabolic Panel: Recent Labs  Lab 05/15/19 0910  NA 133*  K 3.9  CL 95*  CO2 20*  GLUCOSE 159*  BUN 16  CREATININE 0.69  CALCIUM 9.2   Liver Function Tests: Recent Labs  Lab 05/15/19 0910  AST 20  ALT 16  ALKPHOS 58  BILITOT 1.2  PROT 7.2  ALBUMIN 3.7   Recent Labs  Lab 05/15/19 0910  LIPASE 25   No results for input(s): AMMONIA in the last 168 hours. CBC: Recent Labs  Lab 05/10/19 0413 05/15/19 0910  WBC 11.9* 12.8*  NEUTROABS  --  11.5*  HGB 12.7 14.7  HCT 38.2 43.6  MCV 94.3 94.0  PLT 220 191   Coagulation Profile: Recent Labs  Lab 05/15/19 0920  INR 0.9   Cardiac Enzymes: Recent Labs  Lab 05/15/19 0910  TROPONINI <0.03   BNP: Invalid input(s): POCBNP CBG: No results for input(s): GLUCAP in the last 168 hours. Urine analysis:    Component Value Date/Time   COLORURINE YELLOW 05/15/2019 0911   APPEARANCEUR CLEAR 05/15/2019 0911   LABSPEC 1.018 05/15/2019 0911   PHURINE 6.0 05/15/2019 0911   GLUCOSEU 50 (A) 05/15/2019 0911   HGBUR SMALL (A) 05/15/2019 0911   BILIRUBINUR NEGATIVE 05/15/2019 0911   KETONESUR 80 (A) 05/15/2019 0911   PROTEINUR 30 (A) 05/15/2019 0911   NITRITE NEGATIVE 05/15/2019 0911   LEUKOCYTESUR NEGATIVE 05/15/2019 0911   Sepsis Labs: @LABRCNTIP (procalcitonin:4,lacticidven:4) ) Recent Results (from the past 240 hour(s))  SARS Coronavirus 2 (CEPHEID - Performed in [place] hospital lab), Hosp Order     Status: None   Collection Time: 05/15/19  9:12 AM  Result Value Ref Range Status   SARS Coronavirus 2 NEGATIVE NEGATIVE Final    Comment: (NOTE) If result is NEGATIVE SARS-CoV-2 target nucleic acids are NOT DETECTED. The SARS-CoV-2 RNA is generally detectable in upper and lower  respiratory specimens during the acute phase of infection. The lowest   concentration of SARS-CoV-2 viral copies this assay can detect is 250  copies / mL. A negative result does not preclude SARS-CoV-2 infection  and should not be used as the sole basis for treatment or other  patient management decisions.  A negative result may occur with  improper specimen collection / handling, submission of specimen other  than nasopharyngeal swab, presence of viral mutation(s) within the  areas targeted by this assay, and inadequate number of viral copies  (<250 copies / mL). A negative result must be combined with clinical  observations, patient history, and epidemiological information. If result is POSITIVE SARS-CoV-2 target nucleic acids are DETECTED. The SARS-CoV-2 RNA is generally detectable in upper and lower  respiratory specimens dur ing the acute phase of infection.  Positive  results are indicative of active infection with SARS-CoV-2.  Clinical  correlation with patient history and other diagnostic information is  necessary to determine patient infection status.  Positive results do  not rule out bacterial infection or co-infection with other viruses. If result is PRESUMPTIVE POSTIVE SARS-CoV-2 nucleic acids MAY BE PRESENT.   A presumptive positive result was obtained on the submitted specimen  and confirmed on repeat testing.  While 2019 novel coronavirus  (SARS-CoV-2) nucleic acids may be present in the submitted sample  additional confirmatory testing may be necessary for epidemiological  and / or clinical management purposes  to differentiate between  SARS-CoV-2 and other Sarbecovirus currently known to infect humans.  If clinically indicated additional testing with an alternate test  methodology [phone]) is advised. The SARS-CoV-2 RNA is generally  detectable in upper and lower respiratory sp ecimens during the acute  phase of infection. The expected result is Negative. Fact Sheet for Patients:  StrictlyIdeas.no Fact Sheet  for Healthcare Providers: BankingDealers.co.za This test is not yet approved or cleared by the [place] FDA and has been authorized for detection and/or diagnosis of SARS-CoV-2 by FDA under an Emergency Use Authorization (EUA).  This EUA will remain in effect (meaning this test can be used) for the duration of the COVID-19 declaration under Section 564(b)(1) of the Act, 21 U.S.C. section 360bbb-3(b)(1), unless the authorization is terminated or revoked sooner. Performed at [facility], [address] [place], [place] [address]      Radiological Exams on Admission: Ct Head Wo Contrast  Result Date: 05/15/2019 CLINICAL DATA:  Difficulty communicating EXAM: CT HEAD  WITHOUT CONTRAST TECHNIQUE: Contiguous axial images were obtained from the base of the skull through the vertex without intravenous contrast. COMPARISON:  None. FINDINGS: Brain: There are multifocal areas of decreased attenuation consistent with acute infarction involving the superior half of the cerebellum on the left and centrally within the cerebellum on the right as well as an area within the left occipital lobe. Encephalomalacia is noted in the right frontal lobe consistent with prior ischemia. Generalized atrophy and chronic white matter ischemic changes are noted as well. Vascular: No hyperdense vessel or unexpected calcification. Skull: Normal. Negative for fracture or focal lesion. Sinuses/Orbits: No acute finding. Other: None. 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 MRI is recommended for further evaluation. 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 Francine Graven , who verbally acknowledged these results. Electronically Signed   By: Inez Catalina M.D.   On: 05/15/2019 13:17   Dg Chest Port 1 View  Result Date:  05/15/2019 CLINICAL DATA:  Weakness EXAM: PORTABLE CHEST 1 VIEW COMPARISON:  09/16/2015 FINDINGS: Heart is normal size. Mild peribronchial thickening and interstitial prominence, likely mild bronchitic changes. No effusions or acute bony abnormality. IMPRESSION: Peribronchial thickening and interstitial prominence suggestive of bronchitic changes. Electronically Signed   By: Rolm Baptise M.D.   On: 05/15/2019 09:57    EKG: Independently reviewed. Sinus, nonspecific ST changes    Time spent:60 minutes Code Status:   FULL Family Communication:  Spouse updated on phone 6/8 Disposition Plan: expect 1-2 day hospitalization Consults called: neurology, general surgery DVT Prophylaxis: [place] Lovenox  Orson Eva, DO  [facility] Pager [phone]  If 7PM-7AM, please contact night-coverage [email or web address] Password [email or web address] 05/15/2019, 3:15 PM

## 2019-05-15 NOTE — ED Notes (Signed)
Received verbal consent from pt's husband Jeneen Rinks "[address] [place], witnessed by B. Olena Heckle, RN and A. Al Corpus, Therapist, sports

## 2019-05-15 NOTE — ED Notes (Addendum)
When asked how many fingers this nurse was holding up (2) pt stated 3, held up 4 and pt stated that nurse was holding up 3.

## 2019-05-15 NOTE — Consult Note (Addendum)
[facility] Tele[facility]urology Consult Services  Stat Consult  Date of Service:   05/15/2019 13:24:52  Impression:     .  Rule Out Acute Ischemic Stroke  Comments/Sign-Out: Patient is a 78 yo female with a PMH of HLD, breast cancer s/p mastectomy 05/09/19 who presents with generalized weakness, confusion and decreased PO intake. Symptoms began Saturday 05/13/19. Per patient and her husband Saturday AM she appeared to feel close to normal, as the day progressed she developed confusion, lethargy and weakness. LNK 05/13/19. CTH revealed hypoattenuation in the bilateral cerebellar areas, and left occipital area. Differential includes acute stroke vs metastases. SHe is out of the therapeutic window for acute intervention including TPA and NIR. LNK >24 hours prior.  Patient to be transferred to [facility]. Neurology to see patient after arrival.   CT HEAD: Reviewed There are multifocal areas of decreased attenuation consistent with acute infarction involving the superior half of the cerebellum on the left and centrally within the cerebellum on the right as well as an area within the left occipital lobe. Encephalomalacia is noted in the right frontal lobe consistent with prior ischemia.  Metrics: [facility] Notification Time: 05/15/2019 13:22:44 Stamp Time: 05/15/2019 13:24:52 Callback Response Time: 05/15/2019 13:49:15 Video Start Time: 05/15/2019 14:00:18 Video End Time: 05/15/2019 14:07:49  Our recommendations are outlined below.  Recommendations:     .  Antiplatelet Therapy Recommended     .  Rec MRI brain w/wo to determine if acute posterior circulation strokes vs metastases. MRA head and neck vs CTA head and neck     .  If acute stroke, she will require ASA when medically cleared and embolic stroke evaluation. Including vascular imaging particularly of posterior circulation as above     .  If metastatic lesions consider NES consult, Dexamethasone, EEG, seizure  ppx  Imaging Studies:     .  MRI Head with and Without Contrast     .  MRA Head and Neck Without Contrast When Available - Stroke Protocol- Repeat CTH tomorrow or earlier PRN to monitor for posterior circulation edema, with close neurochecks    .  Echocardiogram - Transthoracic Echocardiogram  Therapies:     .  Physical Therapy, Occupational Therapy, Speech Therapy Assessment When Applicable  Disposition: Neurology Follow Up Recommended  Sign Out:     .  Discussed with Emergency Department Provider  ----------------------------------------------------------------------------------------------------  Chief Complaint: abnormal CTH  History of Present Illness: Patient is a 78 year old Female.  Patient is a 78 yo female with a PMH of HLD, breast cancer s/p mastectomy 05/09/19 who presents with generalized weakness, confusion and decreased PO intake. Symptoms began Saturday 05/13/19. Per patient and her husband Saturday AM she appeared to feel close to normal, as the day progressed she developed confusion, lethargy and weakenss. She has largely been in bed since Saturday afternoon. On arrival to the ED she appeared encephalopathic, with dehiscence of her mastectomy site. CTH was abnormal with multifocal areas of hypoattenuation noted.  Examination: 1A: Level of Consciousness - Arouses to minor stimulation + 1 1B: Ask Month and Age - Both Questions Right + 0 1C: Blink Eyes & Squeeze Hands - Performs Both Tasks + 0 2: Test Horizontal Extraocular Movements - Normal + 0 3: Test Visual Fields - Partial Hemianopia + 1 4: Test Facial Palsy (Use Grimace if Obtunded) - Minor paralysis (flat nasolabial fold, smile asymmetry) + 1 5A: Test Left Arm Motor Drift - No Drift for 10 Seconds + 0 5B: Test Right Arm Motor  Drift - Drift, but doesn't hit bed + 1 6A: Test Left Leg Motor Drift - No Drift for 5 Seconds + 0 6B: Test Right Leg Motor Drift - No Drift for 5 Seconds + 0 7: Test Limb Ataxia  (FNF/Heel-Shin) - Ataxia in 2 Limbs + 2 8: Test Sensation - Normal; No sensory loss + 0 9: Test Language/Aphasia - Mild-Moderate Aphasia: Some Obvious Changes, Without Significant Limitation + 1 10: Test Dysarthria - Severe Dysarthria: Unintelligble Slurring or Out of Proportion to Aphasia + 2 11: Test Extinction/Inattention - No abnormality + 0  NIHSS Score: 9   Due to the immediate potential for life-threatening deterioration due to underlying acute neurologic illness, I spent 35 minutes providing critical care. This time includes time for face to face visit via telemedicine, review of medical records, imaging studies and discussion of findings with providers, the patient and/or family.   Dr Ruffin Frederick   [facility] [phone]   Case [id number]

## 2019-05-15 NOTE — ED Notes (Signed)
Left breast incision intact with skin glue. Closed bulb drainage system with serous drainage. Bruising noted along incision line. Noted to have small amount of bloody draining on sheet

## 2019-05-15 NOTE — ED Notes (Signed)
Tech walked in pts room noted blood on fingernails. Noted to have opened approx 2/3 of left breast incision. EDP notified to look at wound and wound bandaged .

## 2019-05-15 NOTE — ED Provider Notes (Signed)
[facility] EMERGENCY DEPARTMENT Provider Note   CSN: [id number] Arrival date & time: 05/15/19  0109    History   Chief Complaint Chief Complaint  Patient presents with  . Failure To Thrive    HPI Jessica Harrell is a 78 y.o. female.     HPI  Pt was seen at 0900. Per EMS and pt report: Pt c/o gradual onset and worsening of persistent generalized weakness for the past 2 days. Pt s/p left mastectomy 6 days ago, and has been home for the past 5 days. Pt has been unable to get out of bed due to generalized weakness and has not been taking PO. Pt "may have had an episode of vomiting" at some time over the past few days (dried emesis on clothing). Denies CP/palpitations, no SOB/cough, no abd pain, no diarrhea, no back pain, no rash, no fevers, no focal motor weakness.    Past Medical History:  Diagnosis Date  . Breast cancer ([place])   . Right foot pain Nov. 2014    Patient Active Problem List   Diagnosis Date Noted  . History of left breast cancer 05/09/2019  . Malignant neoplasm of lower-inner quadrant of left breast in female, estrogen receptor positive ([place]) 04/26/2018    Past Surgical History:  Procedure Laterality Date  . BREAST BIOPSY    . CATARACT EXTRACTION     both eyes  . EYE SURGERY Left   . MASTECTOMY W/ SENTINEL NODE BIOPSY Left 05/09/2019   Procedure: LEFT MASTECTOMY WITH LEFT AXILLARY SENTINEL LYMPH NODE BIOPSY;  Surgeon: Coralie Keens, MD;  Location: [facility];  Service: General;  Laterality: Left;     OB History   No obstetric history on file.      Home Medications    Prior to Admission medications   Medication Sig Start Date End Date Taking? Authorizing Provider  aspirin EC 81 MG tablet Take 81 mg by mouth daily.    [provider]  cholecalciferol (VITAMIN D) 1000 units tablet Take 1,000 Units by mouth daily.    [provider]  letrozole (FEMARA) 2.5 MG tablet TAKE 1 TABLET ONCE DAILY. 04/19/19   Nicholas Lose, MD  Red Yeast  Rice Extract (RED YEAST RICE PO) Take by mouth.    [provider]  traMADol (ULTRAM) 50 MG tablet Take 1 tablet (50 mg total) by mouth every 6 (six) hours as needed for moderate pain or severe pain. 05/10/19   Coralie Keens, MD    Family History Family History  Problem Relation Age of Onset  . Cancer Mother        esophageal  . Hypertension Mother   . Varicose Veins Mother   . Cancer Sister        non hodgkins  . Cancer Other        unknown    Social History Social History   Tobacco Use  . Smoking status: Current Every Day Smoker    Packs/day: 0.50    Years: 58.00    Pack years: 29.00    Types: Cigarettes  . Smokeless tobacco: Never Used  Substance Use Topics  . Alcohol use: Yes    Alcohol/week: 7.0 standard drinks    Types: 7 Cans of beer per week    Comment: 1 beer every 1-2 days  . Drug use: No     Allergies   Atorvastatin   Review of Systems Review of Systems ROS: Statement: All systems negative except as marked or noted in the HPI;  Constitutional: Negative for fever and chills. +generalized weakness.; ; Eyes: Negative for eye pain, redness and discharge. ; ; ENMT: Negative for ear pain, hoarseness, nasal congestion, sinus pressure and sore throat. ; ; Cardiovascular: Negative for chest pain, palpitations, diaphoresis, dyspnea and peripheral edema. ; ; Respiratory: Negative for cough, wheezing and stridor. ; ; Gastrointestinal: +possible N/V. Negative for diarrhea, abdominal pain, blood in stool, hematemesis, jaundice and rectal bleeding. . ; ; Genitourinary: Negative for dysuria, flank pain and hematuria. ; ; Musculoskeletal: Negative for back pain and neck pain. Negative for swelling and trauma.; ; Skin: Negative for pruritus, rash, abrasions, blisters, bruising and skin lesion.; ; Neuro: Negative for headache, lightheadedness and neck stiffness. Negative for altered level of consciousness, altered mental status, extremity weakness, paresthesias,  involuntary movement, seizure and syncope.       Physical Exam Updated Vital Signs BP (!) 156/77 (BP Location: Right Arm)   Pulse 78   Temp 98.1 F (36.7 C) (Oral)   Resp 17   Ht 5\' 7"  (1.702 m)   Wt 64 kg   SpO2 97%   BMI 22.10 kg/m      Physical Exam 0905: Physical examination:  Nursing notes reviewed; Vital signs and O2 SAT reviewed;  Constitutional: Well developed, Well nourished, In no acute distress; Head:  Normocephalic, atraumatic; Eyes: EOMI, PERRL, No scleral icterus; ENMT: Mouth and pharynx normal, Mucous membranes dry and cracked; Neck: Supple, Full range of motion, No lymphadenopathy; Cardiovascular: Regular rate and rhythm, No gallop; Respiratory: Breath sounds clear & equal bilaterally, No wheezes.  Speaking full sentences with ease, Normal respiratory effort/excursion; Chest: Nontender, Movement normal. +left chest wall surgical wound with sutures intact, +JP drain with serosanguinous drainage, no purulent obvious drainage, no erythema,;; Abdomen: Soft, Nontender, Nondistended, Normal bowel sounds. Dried emesis on nightgown; Genitourinary: No CVA tenderness; Extremities: Peripheral pulses normal, No tenderness, No edema, No calf edema or asymmetry.; Neuro: Awake, alert, confused re: events, No facial droop. Speech clear. Strength 5/5 equal bilat UE's and LE's. Pt moves all extremities on stretcher and to command without apparent gross focal motor or sensory deficits in extremities.; Skin: Color normal, Warm, Dry.   ED Treatments / Results  Labs (all labs ordered are listed, but only abnormal results are displayed)   EKG EKG Interpretation  Date/Time:  Monday May 15 2019 08:44:02 EDT Ventricular Rate:  78 PR Interval:    QRS Duration: 76 QT Interval:  401 QTC Calculation: 457 R Axis:   72 Text Interpretation:  Sinus rhythm Anteroseptal infarct, age indeterminate Nonspecific ST and T wave abnormality When compared with ECG of 09/22/2010 Nonspecific ST and T wave  abnormality is now Present Confirmed by Francine Graven [phone]) on 05/15/2019 9:25:35 AM   Radiology   Procedures Procedures (including critical care time)  Medications Ordered in ED Medications - No data to display   Initial Impression / Assessment and Plan / ED Course  I have reviewed the triage vital signs and the nursing notes.  Pertinent labs & imaging results that were available during my care of the patient were reviewed by me and considered in my medical decision making (see chart for details).     MDM Reviewed: previous chart, nursing note and vitals Reviewed previous: labs and ECG Interpretation: labs, ECG, x-ray and CT scan Total time providing critical care: 30-74 minutes. This excludes time spent performing separately reportable procedures and services. Consults: admitting MD, neurology and general surgery    CRITICAL CARE Performed by: Francine Graven Total critical care  time: 35 minutes Critical care time was exclusive of separately billable procedures and treating other patients. Critical care was necessary to treat or prevent imminent or life-threatening deterioration. Critical care was time spent personally by me on the following activities: development of treatment plan with patient and/or surrogate as well as nursing, discussions with consultants, evaluation of patient's response to treatment, examination of patient, obtaining history from patient or surrogate, ordering and performing treatments and interventions, ordering and review of laboratory studies, ordering and review of radiographic studies, pulse oximetry and re-evaluation of patient's condition.  Results for orders placed or performed during the hospital encounter of 05/15/19  SARS Coronavirus 2 (CEPHEID - Performed in [place] hospital lab), [facility] Order  Result Value Ref Range   SARS Coronavirus 2 NEGATIVE NEGATIVE  Urinalysis, Routine w reflex microscopic  Result Value Ref Range   Color,  Urine YELLOW YELLOW   APPearance CLEAR CLEAR   Specific Gravity, Urine 1.018 1.005 - 1.030   pH 6.0 5.0 - 8.0   Glucose, UA 50 (A) NEGATIVE mg/dL   Hgb urine dipstick SMALL (A) NEGATIVE   Bilirubin Urine NEGATIVE NEGATIVE   Ketones, ur 80 (A) NEGATIVE mg/dL   Protein, ur 30 (A) NEGATIVE mg/dL   Nitrite NEGATIVE NEGATIVE   Leukocytes,Ua NEGATIVE NEGATIVE   RBC / HPF 0-5 0 - 5 RBC/hpf   WBC, UA 0-5 0 - 5 WBC/hpf   Bacteria, UA NONE SEEN NONE SEEN   Squamous Epithelial / LPF 0-5 0 - 5   Mucus PRESENT   Comprehensive metabolic panel  Result Value Ref Range   Sodium 133 (L) 135 - 145 mmol/L   Potassium 3.9 3.5 - 5.1 mmol/L   Chloride 95 (L) 98 - 111 mmol/L   CO2 20 (L) 22 - 32 mmol/L   Glucose, Bld 159 (H) 70 - 99 mg/dL   BUN 16 8 - 23 mg/dL   Creatinine, Ser 0.69 0.44 - 1.00 mg/dL   Calcium 9.2 8.9 - 10.3 mg/dL   Total Protein 7.2 6.5 - 8.1 g/dL   Albumin 3.7 3.5 - 5.0 g/dL   AST 20 15 - 41 U/L   ALT 16 0 - 44 U/L   Alkaline Phosphatase 58 38 - 126 U/L   Total Bilirubin 1.2 0.3 - 1.2 mg/dL   GFR calc non Af Amer >60 >60 mL/min   GFR calc Af Amer >60 >60 mL/min   Anion gap 18 (H) 5 - 15  Lipase, blood  Result Value Ref Range   Lipase 25 11 - 51 U/L  Troponin I - Once  Result Value Ref Range   Troponin I <0.03 <0.03 ng/mL  Lactic acid, plasma  Result Value Ref Range   Lactic Acid, Venous 2.0 (HH) 0.5 - 1.9 mmol/L  Lactic acid, plasma  Result Value Ref Range   Lactic Acid, Venous 4.4 (HH) 0.5 - 1.9 mmol/L  CBC with Differential  Result Value Ref Range   WBC 12.8 (H) 4.0 - 10.5 K/uL   RBC 4.64 3.87 - 5.11 MIL/uL   Hemoglobin 14.7 12.0 - 15.0 g/dL   HCT 43.6 36.0 - 46.0 %   MCV 94.0 80.0 - 100.0 fL   MCH 31.7 26.0 - 34.0 pg   MCHC 33.7 30.0 - 36.0 g/dL   RDW 12.4 11.5 - 15.5 %   Platelets 191 150 - 400 K/uL   nRBC 0.0 0.0 - 0.2 %   Neutrophils Relative % 89 %   Neutro Abs 11.5 (H) 1.7 - 7.7  K/uL   Lymphocytes Relative 7 %   Lymphs Abs 0.8 0.7 - 4.0 K/uL    Monocytes Relative 3 %   Monocytes Absolute 0.4 0.1 - 1.0 K/uL   Eosinophils Relative 0 %   Eosinophils Absolute 0.0 0.0 - 0.5 K/uL   Basophils Relative 0 %   Basophils Absolute 0.0 0.0 - 0.1 K/uL   Immature Granulocytes 1 %   Abs Immature Granulocytes 0.07 0.00 - 0.07 K/uL  Protime-INR  Result Value Ref Range   Prothrombin Time 12.4 11.4 - 15.2 seconds   INR 0.9 0.8 - 1.2   Ct Head Wo Contrast Result Date: 05/15/2019 CLINICAL DATA:  Difficulty communicating EXAM: CT HEAD WITHOUT CONTRAST TECHNIQUE: Contiguous axial images were obtained from the base of the skull through the vertex without intravenous contrast. COMPARISON:  None. FINDINGS: Brain: There are multifocal areas of decreased attenuation consistent with acute infarction involving the superior half of the cerebellum on the left and centrally within the cerebellum on the right as well as an area within the left occipital lobe. Encephalomalacia is noted in the right frontal lobe consistent with prior ischemia. Generalized atrophy and chronic white matter ischemic changes are noted as well. Vascular: No hyperdense vessel or unexpected calcification. Skull: Normal. Negative for fracture or focal lesion. Sinuses/Orbits: No acute finding. Other: None. 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 MRI is recommended for further evaluation. 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 Francine Graven , who verbally acknowledged these results. Electronically Signed   By: Inez Catalina M.D.   On: 05/15/2019 13:17    Dg Chest Port 1 View Result Date: 05/15/2019 CLINICAL DATA:  Weakness EXAM: PORTABLE CHEST 1 VIEW COMPARISON:  09/16/2015 FINDINGS: Heart is normal size. Mild peribronchial thickening and interstitial prominence, likely mild bronchitic changes. No effusions or acute bony  abnormality. IMPRESSION: Peribronchial thickening and interstitial prominence suggestive of bronchitic changes. Electronically Signed   By: Rolm Baptise M.D.   On: 05/15/2019 09:57     Jessica Harrell was evaluated in Emergency Department on 05/15/2019 for the symptoms described in the history of present illness. She was evaluated in the context of the global COVID-19 pandemic, which necessitated consideration that the patient might be at risk for infection with the SARS-CoV-2 virus that causes COVID-19. Institutional protocols and algorithms that pertain to the evaluation of patients at risk for COVID-19 are in a state of rapid change based on information released by regulatory bodies including the CDC and federal and state organizations. These policies and algorithms were followed during the patient's care in the ED.    1330:  I was called into pt's exam room: pt restless, has opened up the medial 1/3 of her mastectomy incision. JP drain appears intact. No overt bleeding from wound. Wound covered with DSD. T/C returned from [facility] Triage desk staff:  They will leave message for Dr. Ninfa Linden regarding dehiscence of wound and pt's likely transfer to [facility] under [facility] service for stroke workup.   1410:  T/C returned from [facility] MD, case discussed, including:  HPI, pertinent PM/SHx, VS/PE, dx testing, ED course and treatment:  MD has evaluated pt in the ED, no acute intervention at this time, as pt has had symptoms for several days, pt will need admit for stroke workup with consideration to possible ?mets on CT-H vs stroke, recommends MRI  brain w/wo to further delineate.   1435:  T/C returned from [facility] Dr. Carles Collet, case discussed, including:  HPI, pertinent PM/SHx, VS/PE, dx testing, ED course and treatment, as well as d/w Neuro MD and Surgical service triage desk staff:  Agreeable to admit.      Final Clinical Impressions(s) / ED Diagnoses   Final diagnoses:  None    ED Discharge  Orders    None       Francine Graven, DO 05/19/19 1651

## 2019-05-15 NOTE — ED Notes (Signed)
Pt alert and oriented to age, place but not year (thinks it's 60)

## 2019-05-15 NOTE — ED Notes (Signed)
Pt pulling EKG leads x2. Pulling gown off. Attempts made to put back on but removes

## 2019-05-16 ENCOUNTER — Encounter (HOSPITAL_COMMUNITY): Payer: Medicare Other

## 2019-05-16 ENCOUNTER — Inpatient Hospital Stay (HOSPITAL_COMMUNITY): Payer: Medicare Other

## 2019-05-16 DIAGNOSIS — I639 Cerebral infarction, unspecified: Secondary | ICD-10-CM

## 2019-05-16 DIAGNOSIS — G9340 Encephalopathy, unspecified: Secondary | ICD-10-CM

## 2019-05-16 LAB — ECHOCARDIOGRAM COMPLETE
Height: 67 in
Weight: 2289.26 oz

## 2019-05-16 LAB — URINE CULTURE: Culture: NO GROWTH

## 2019-05-16 MED ORDER — IOHEXOL 350 MG/ML SOLN
100.0000 mL | Freq: Once | INTRAVENOUS | Status: AC | PRN
  Administered 2019-05-16: 100 mL via INTRAVENOUS

## 2019-05-16 MED ORDER — RESOURCE THICKENUP CLEAR PO POWD
ORAL | Status: DC | PRN
  Administered 2019-05-16: 21:00:00 via ORAL
  Filled 2019-05-16 (×2): qty 125

## 2019-05-16 MED ORDER — SODIUM CHLORIDE 0.9% FLUSH: 10.0000 mL | INTRAVENOUS | Status: DC | PRN

## 2019-05-16 NOTE — Progress Notes (Signed)
  Echocardiogram 2D Echocardiogram has been performed.  Jennette Dubin 05/16/2019, 10:20 AM

## 2019-05-16 NOTE — Consult Note (Signed)
Referring Physician: Dr. Carles Collet    Chief Complaint: Acute cerebellar stroike  HPI: Jessica Harrell is an 78 y.o. female with breast CA status post mastectomy who was discharged home on Wednesday, did well for 2 days, then became very weak and lethargic on Saturday as the day progressed, requiring her to stay in bed. She also developed confusion. She has had decreased PO intake. She presented to the [facility] ED, where a CT head revealed bilateral cerebellar and left occipital lobe hypoattenuation. DDx at that time was acute stroke versus metastases. She was out of the tPA and VIR time windows.  Teleneurology consult was obtained, with recommendations to start antiplatelet therapy, obtain MRI/MRA of head and TTE.   She has not had CP, SOB, vomiting, headache, fever or abdominal pain. She has a dehisced left breast wound.   Past Medical History:  Diagnosis Date  . Breast cancer ([place])   . Right foot pain Nov. 2014    Past Surgical History:  Procedure Laterality Date  . BREAST BIOPSY    . CATARACT EXTRACTION     both eyes  . EYE SURGERY Left   . MASTECTOMY W/ SENTINEL NODE BIOPSY Left 05/09/2019   Procedure: LEFT MASTECTOMY WITH LEFT AXILLARY SENTINEL LYMPH NODE BIOPSY;  Surgeon: Coralie Keens, MD;  Location: [place];  Service: General;  Laterality: Left;    Family History  Problem Relation Age of Onset  . Cancer Mother        esophageal  . Hypertension Mother   . Varicose Veins Mother   . Cancer Sister        non hodgkins  . Cancer Other        unknown   Social History:  reports that she has been smoking cigarettes. She has a 29.00 pack-year smoking history. She has never used smokeless tobacco. She reports current alcohol use of about 7.0 standard drinks of alcohol per week. She reports that she does not use drugs.  Allergies:  Allergies  Allergen Reactions  . Atorvastatin Other (See Comments)    Muscle pain     Home Medications:  Vitamin D Femara Red yeast rice  extract Tramadol ASA  ROS: As per HPI. Unable to obtain additional ROS due to fatigue/lethargy with decreased verbal output.    Physical Examination: Blood pressure 140/76, pulse 67, temperature 98.8 F (37.1 C), temperature source Oral, resp. rate 19, height 5\' 7"  (1.702 m), weight 64.9 kg, SpO2 98 %.  HEENT: /AT Lungs: Respirations unlabored Ext: No edema  Neurologic Examination: Mental Status: Awakens to a fatigued state with decreased verbal output and almost no spontaneous movement. Oriented to Tuesday and "May". Not oriented to year. Does not answer when asked what city and state she is in. Speech is slow and dysarthric. Able to follow simple motor commands. Right gaze preference. 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  Cranial Nerves: II:  Cannot count fingers correctly. Does not initiate saccades towards visual stimuli. PERRL. Cannot direct her finger towards a target visually, but will feel around for the target. III,IV, VI: Eyes deviated to the right. Will gaze briefly towards the midline when asked, but does not cross to left. No nystagmus.  V,VII: Left facial droop. Facial temp sensation subjectively normal bilaterally VIII: hearing intact to voice IX,X: No hypophonia XI: Head preferentially rotated to right XII: Partially protrudes tongue with no deviation noted Motor: Limited testing due to fatigue/effort.  Grip strength and biceps 5/5 bilaterally Hip and knee extension  5/5 bilaterally  Sensory: Temp sensation subjectively intact x 4.  Deep Tendon Reflexes:  2+ bilateral upper extremities. 3+ bilateral patellae Cerebellar: Unable to target examiner's fingers visually on left or right, but will feel for it. When moving finger back to nose, there is dysmetria on the right and dyssinergia with dysmetria on the left.  Gait: Deferred due to falls risk concerns  Results for orders placed or performed during the hospital encounter of  05/15/19 (from the past 48 hour(s))  Comprehensive metabolic panel     Status: Abnormal   Collection Time: 05/15/19  9:10 AM  Result Value Ref Range   Sodium 133 (L) 135 - 145 mmol/L   Potassium 3.9 3.5 - 5.1 mmol/L   Chloride 95 (L) 98 - 111 mmol/L   CO2 20 (L) 22 - 32 mmol/L   Glucose, Bld 159 (H) 70 - 99 mg/dL   BUN 16 8 - 23 mg/dL   Creatinine, Ser 0.69 0.44 - 1.00 mg/dL   Calcium 9.2 8.9 - 10.3 mg/dL   Total Protein 7.2 6.5 - 8.1 g/dL   Albumin 3.7 3.5 - 5.0 g/dL   AST 20 15 - 41 U/L   ALT 16 0 - 44 U/L   Alkaline Phosphatase 58 38 - 126 U/L   Total Bilirubin 1.2 0.3 - 1.2 mg/dL   GFR calc non Af Amer >60 >60 mL/min   GFR calc Af Amer >60 >60 mL/min   Anion gap 18 (H) 5 - 15    Comment: Performed at [facility], [address], [place], [place] [address]  Lipase, blood     Status: None   Collection Time: 05/15/19  9:10 AM  Result Value Ref Range   Lipase 25 11 - 51 U/L    Comment: Performed at [facility], [address] [place], [place] [address]  Troponin I - Once     Status: None   Collection Time: 05/15/19  9:10 AM  Result Value Ref Range   Troponin I <0.03 <0.03 ng/mL    Comment: Performed at [facility], [address], [place], [place] [address]  Lactic acid, plasma     Status: Abnormal   Collection Time: 05/15/19  9:10 AM  Result Value Ref Range   Lactic Acid, Venous 2.0 (HH) 0.5 - 1.9 mmol/L    Comment: CRITICAL RESULT CALLED TO, READ BACK BY AND VERIFIED WITH: GENTRY,R AT 9:45AM ON 05/15/19 BY Derrill Memo Performed at [facility], [address] [place], [place] [address]   CBC with Differential     Status: Abnormal   Collection Time: 05/15/19  9:10 AM  Result Value Ref Range   WBC 12.8 (H) 4.0 - 10.5 K/uL   RBC 4.64 3.87 - 5.11 MIL/uL   Hemoglobin 14.7 12.0 - 15.0 g/dL   HCT 43.6 36.0 - 46.0 %   MCV 94.0 80.0 - 100.0 fL   MCH 31.7 26.0 - 34.0 pg   MCHC 33.7 30.0 - 36.0 g/dL   RDW 12.4 11.5 - 15.5 %   Platelets 191 150 - 400 K/uL   nRBC 0.0 0.0 -  0.2 %   Neutrophils Relative % 89 %   Neutro Abs 11.5 (H) 1.7 - 7.7 K/uL   Lymphocytes Relative 7 %   Lymphs Abs 0.8 0.7 - 4.0 K/uL   Monocytes Relative 3 %   Monocytes Absolute 0.4 0.1 - 1.0 K/uL   Eosinophils Relative 0 %   Eosinophils Absolute 0.0 0.0 - 0.5 K/uL   Basophils Relative  0 %   Basophils Absolute 0.0 0.0 - 0.1 K/uL   Immature Granulocytes 1 %   Abs Immature Granulocytes 0.07 0.00 - 0.07 K/uL    Comment: Performed at [facility], [address] [place], [place] [address]  Culture, blood (Routine X 2) w Reflex to ID Panel     Status: None (Preliminary result)   Collection Time: 05/15/19  9:10 AM  Result Value Ref Range   Specimen Description LEFT ANTECUBITAL    Special Requests      BOTTLES DRAWN AEROBIC AND ANAEROBIC Blood Culture adequate volume Performed at [facility], [address], [place], [place] [address]    Culture PENDING    Report Status PENDING   Urinalysis, Routine w reflex microscopic     Status: Abnormal   Collection Time: 05/15/19  9:11 AM  Result Value Ref Range   Color, Urine YELLOW YELLOW   APPearance CLEAR CLEAR   Specific Gravity, Urine 1.018 1.005 - 1.030   pH 6.0 5.0 - 8.0   Glucose, UA 50 (A) NEGATIVE mg/dL   Hgb urine dipstick SMALL (A) NEGATIVE   Bilirubin Urine NEGATIVE NEGATIVE   Ketones, ur 80 (A) NEGATIVE mg/dL   Protein, ur 30 (A) NEGATIVE mg/dL   Nitrite NEGATIVE NEGATIVE   Leukocytes,Ua NEGATIVE NEGATIVE   RBC / HPF 0-5 0 - 5 RBC/hpf   WBC, UA 0-5 0 - 5 WBC/hpf   Bacteria, UA NONE SEEN NONE SEEN   Squamous Epithelial / LPF 0-5 0 - 5   Mucus PRESENT     Comment: Performed at [facility], [address], [place],  [address]  SARS Coronavirus 2 (CEPHEID - Performed in [facility] hospital lab), Hosp Order     Status: None   Collection Time: 05/15/19  9:12 AM  Result Value Ref Range   SARS Coronavirus 2 NEGATIVE NEGATIVE    Comment: (NOTE) If result is NEGATIVE SARS-CoV-2 target nucleic acids are NOT DETECTED. The  SARS-CoV-2 RNA is generally detectable in upper and lower  respiratory specimens during the acute phase of infection. The lowest  concentration of SARS-CoV-2 viral copies this assay can detect is 250  copies / mL. A negative result does not preclude SARS-CoV-2 infection  and should not be used as the sole basis for treatment or other  patient management decisions.  A negative result may occur with  improper specimen collection / handling, submission of specimen other  than nasopharyngeal swab, presence of viral mutation(s) within the  areas targeted by this assay, and inadequate number of viral copies  (<250 copies / mL). A negative result must be combined with clinical  observations, patient history, and epidemiological information. If result is POSITIVE SARS-CoV-2 target nucleic acids are DETECTED. The SARS-CoV-2 RNA is generally detectable in upper and lower  respiratory specimens dur ing the acute phase of infection.  Positive  results are indicative of active infection with SARS-CoV-2.  Clinical  correlation with patient history and other diagnostic information is  necessary to determine patient infection status.  Positive results do  not rule out bacterial infection or co-infection with other viruses. If result is PRESUMPTIVE POSTIVE SARS-CoV-2 nucleic acids MAY BE PRESENT.   A presumptive positive result was obtained on the submitted specimen  and confirmed on repeat testing.  While 2019 novel coronavirus  (SARS-CoV-2) nucleic acids may be present in the submitted sample  additional confirmatory testing may be necessary for epidemiological  and / or clinical management purposes  to differentiate between  SARS-CoV-2  and other Sarbecovirus currently known to infect humans.  If clinically indicated additional testing with an alternate test  methodology [phone]) is advised. The SARS-CoV-2 RNA is generally  detectable in upper and lower respiratory sp ecimens during the acute   phase of infection. The expected result is Negative. Fact Sheet for Patients:  StrictlyIdeas.no Fact Sheet for Healthcare Providers: BankingDealers.co.za This test is not yet approved or cleared by the [place] FDA and has been authorized for detection and/or diagnosis of SARS-CoV-2 by FDA under an Emergency Use Authorization (EUA).  This EUA will remain in effect (meaning this test can be used) for the duration of the COVID-19 declaration under Section 564(b)(1) of the Act, 21 U.S.C. section 360bbb-3(b)(1), unless the authorization is terminated or revoked sooner. Performed at [facility], [address] [place], [place] [address]   Protime-INR     Status: None   Collection Time: 05/15/19  9:20 AM  Result Value Ref Range   Prothrombin Time 12.4 11.4 - 15.2 seconds   INR 0.9 0.8 - 1.2    Comment: Performed at [facility], [address] [place], [place] [address]  Lactic acid, plasma     Status: Abnormal   Collection Time: 05/15/19 11:15 AM  Result Value Ref Range   Lactic Acid, Venous 4.4 (HH) 0.5 - 1.9 mmol/L    Comment: CRITICAL RESULT CALLED TO, READ BACK BY AND VERIFIED WITH: CREWS,M AT 11:40AM ON 05/15/19 BY [facility] Performed at [facility], [address], [place], [place] [address]   Culture, blood (Routine X 2) w Reflex to ID Panel     Status: None (Preliminary result)   Collection Time: 05/15/19  3:44 PM  Result Value Ref Range   Specimen Description BLOOD RIGHT WRIST    Special Requests      BOTTLES DRAWN AEROBIC ONLY Blood Culture adequate volume Performed at [facility], [address], [place],  [address]    Culture PENDING    Report Status PENDING    Ct Head Wo Contrast  Result Date: 05/15/2019 CLINICAL DATA:  Difficulty communicating EXAM: CT HEAD WITHOUT CONTRAST TECHNIQUE: Contiguous axial images were obtained from the base of the skull through the vertex without intravenous contrast.  COMPARISON:  None. FINDINGS: Brain: There are multifocal areas of decreased attenuation consistent with acute infarction involving the superior half of the cerebellum on the left and centrally within the cerebellum on the right as well as an area within the left occipital lobe. Encephalomalacia is noted in the right frontal lobe consistent with prior ischemia. Generalized atrophy and chronic white matter ischemic changes are noted as well. Vascular: No hyperdense vessel or unexpected calcification. Skull: Normal. Negative for fracture or focal lesion. Sinuses/Orbits: No acute finding. Other: None. 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 MRI is recommended for further evaluation. 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 Francine Graven , who verbally acknowledged these results. Electronically Signed   By: Inez Catalina M.D.   On: 05/15/2019 13:17   Dg Chest Port 1 View  Result Date: 05/15/2019 CLINICAL DATA:  Weakness EXAM: PORTABLE CHEST 1 VIEW COMPARISON:  09/16/2015 FINDINGS: Heart is normal size. Mild peribronchial thickening and interstitial prominence, likely mild bronchitic changes. No effusions or acute bony abnormality. IMPRESSION: Peribronchial thickening and interstitial prominence suggestive of bronchitic changes. Electronically Signed   By: Rolm Baptise M.D.  On: 05/15/2019 09:57    Assessment: 78 y.o. 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  1. CT head: Multifocal infarcts involving the cerebellum bilaterally as well as the left occipital lobe. Old infarct in the right frontal lobe is also noted. The left cerebellar stroke is large in size, with no significant mass effect on the 4th ventricle or brainstem.  2. Exam reveals cortical blindness, left facial droop, right gaze  deviation and bilateral upper extremity ataxia 3. Etiology for stroke suspected to be hypercoagulability in the setting of breast cancer.  4. Other Stroke Risk Factors - smoking.   Plan: 1. HgbA1c, fasting lipid panel 2. MRI, MRA of the brain without contrast 3. PT consult, OT consult, Speech consult 4. Echocardiogram 5. Carotid dopplers 6. Prophylactic therapy- Continue ASA. May need to add or switch to Plavix. Will defer to AM Stroke Team.  7. Risk factor modification 8. Telemetry monitoring 9. Frequent neuro checks 10. Allergy to atorvastatin 11. Repeat CT head at 1 PM on 6/9 to assess for stability of the large left cerebellar stroke.    @Electronically  signed: Dr. Kerney Elbe  05/16/2019, 5:05 AM

## 2019-05-16 NOTE — Progress Notes (Signed)
   Subjective/Chief Complaint: Events noted    Objective: Vital signs in last 24 hours: Temp:  [98.1 F (36.7 C)-99.1 F (37.3 C)] 98.8 F (37.1 C) (06/09 0500) Pulse Rate:  [67-109] 68 (06/09 0500) Resp:  [16-22] 18 (06/09 0500) BP: (140-183)/(63-118) 167/72 (06/09 0500) SpO2:  [94 %-100 %] 98 % (06/09 0500) Weight:  [64 kg-64.9 kg] 64.9 kg (06/08 2121)    Intake/Output from previous day: 06/08 0701 - 06/09 0700 In: 2000 [IV Piggyback:2000] Out: 410 [Urine:400; Drains:10] Intake/Output this shift: No intake/output data recorded.  Exam: Sleeping but able to wake, lethargic, not answering questions Left mastectomy incision is ok with only mild breakdown medially.  Drain is holding suction  Lab Results:  Recent Labs    05/15/19 0910  WBC 12.8*  HGB 14.7  HCT 43.6  PLT 191   BMET Recent Labs    05/15/19 0910  NA 133*  K 3.9  CL 95*  CO2 20*  GLUCOSE 159*  BUN 16  CREATININE 0.69  CALCIUM 9.2   PT/INR Recent Labs    05/15/19 0920  LABPROT 12.4  INR 0.9   ABG No results for input(s): PHART, HCO3 in the last 72 hours.  Invalid input(s): PCO2, PO2  Studies/Results: Ct Head Wo Contrast  Result Date: 05/15/2019 CLINICAL DATA:  Difficulty communicating EXAM: CT HEAD WITHOUT CONTRAST TECHNIQUE: Contiguous axial images were obtained from the base of the skull through the vertex without intravenous contrast. COMPARISON:  None. FINDINGS: Brain: There are multifocal areas of decreased attenuation consistent with acute infarction involving the superior half of the cerebellum on the left and centrally within the cerebellum on the right as well as an area within the left occipital lobe. Encephalomalacia is noted in the right frontal lobe consistent with prior ischemia. Generalized atrophy and chronic white matter ischemic changes are noted as well. Vascular: No hyperdense vessel or unexpected calcification. Skull: Normal. Negative for fracture or focal lesion.  Sinuses/Orbits: No acute finding. Other: None. 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 MRI is recommended for further evaluation. 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 Francine Graven , who verbally acknowledged these results. Electronically Signed   By: Inez Catalina M.D.   On: 05/15/2019 13:17   Dg Chest Port 1 View  Result Date: 05/15/2019 CLINICAL DATA:  Weakness EXAM: PORTABLE CHEST 1 VIEW COMPARISON:  09/16/2015 FINDINGS: Heart is normal size. Mild peribronchial thickening and interstitial prominence, likely mild bronchitic changes. No effusions or acute bony abnormality. IMPRESSION: Peribronchial thickening and interstitial prominence suggestive of bronchitic changes. Electronically Signed   By: Rolm Baptise M.D.   On: 05/15/2019 09:57    Anti-infectives: Anti-infectives (From admission, onward)   None      Assessment/Plan: S/p mastectomy (6/2) for left breast cancer with post op multifocal CVA  Workup in progress  Will continue current dressing and binder leaving drain in place for now.  Will follow.   LOS: 1 day    Jessica Harrell 05/16/2019

## 2019-05-16 NOTE — Evaluation (Signed)
Clinical/Bedside Swallow Evaluation Patient Details  Name: Jessica Harrell MRN: [id number] Date of Birth: 01/30/1941  Today's Date: 05/16/2019 Time: SLP Start Time (ACUTE ONLY): 1009 SLP Stop Time (ACUTE ONLY): 1045 SLP Time Calculation (min) (ACUTE ONLY): 36 min  Past Medical History:  Past Medical History:  Diagnosis Date  . Breast cancer ([place])   . Right foot pain Nov. 2014   Past Surgical History:  Past Surgical History:  Procedure Laterality Date  . BREAST BIOPSY    . CATARACT EXTRACTION     both eyes  . EYE SURGERY Left   . MASTECTOMY W/ SENTINEL NODE BIOPSY Left 05/09/2019   Procedure: LEFT MASTECTOMY WITH LEFT AXILLARY SENTINEL LYMPH NODE BIOPSY;  Surgeon: Coralie Keens, MD;  Location: [place];  Service: General;  Laterality: Left;   HPI:  Jessica Harrell is an 78 y.o. female with breast CA status post mastectomy last week who was discharged home on Wednesday, did well for 2 days, then became very weak and lethargic on Saturday with confusion and decreased po intake- diagnosed with encephalopathy. She presented to the [facility] ED, where a CT head revealed bilateral cerebellar and left occipital lobe hypoattenuation. She was outside of the window for TPA.  Pt has h/o smoking and reports she is a current smoker.  Speech and swallow evaluation ordered.     Assessment / Plan / Recommendation Clinical Impression  Pt with decreased labial seal due to facial nerve deficit but otherwise no focal CN deficit impacting swallow.  Her voice and cough are strong fortuantely.  She was very xerostomic and SLP assisted her to brush her teeth. Pt very ataxic with tooth brushing attempts.   Cough x2 during session likely due to aspiration - causing her to expectorate brown colored secretions. Suspect this mobilized secretions retained in pharynx.  Probable premature loss of boluses into pharynx allowed laryngeal penetration and/or aspiration.  However further boluses were tolerated without  overt difficulties.  Pt does conduct multiple swallows with boluses likely piece-mealing.    Given her h/o smoking and xerostomia, suspect secretions were retained in pharynx and mobilized with intake.  Pt admits to premorbid coughing at times with liquids at home denies issues with foods.  Although pt without overt indication of aspiration with thin, given her hx, new neuro event, lethargy and baseline smoker - recommend modifed diet with very strict precautions.  Pt must be fed SLOWLY.    Will follow up for pt tolerance, education and indication for instrumental swallow evaluation.   SLP Visit Diagnosis: Dysphagia, unspecified (R13.10)    Aspiration Risk  Moderate aspiration risk    Diet Recommendation Dysphagia 3 (Mech soft);Nectar-thick liquid;Ice chips PRN after oral care(single ice chips)   Liquid Administration via: Cup;Straw Medication Administration: Whole meds with puree Supervision: Full supervision/cueing for compensatory strategies Compensations: Slow rate;Small sips/bites Postural Changes: Remain upright for at least 30 minutes after po intake;Seated upright at 90 degrees    Other  Recommendations Oral Care Recommendations: Oral care BID Other Recommendations: Order thickener from pharmacy   Follow up Recommendations Skilled Nursing facility      Frequency and Duration min 2x/week  2 weeks       Prognosis Prognosis for Safe Diet Advancement: Good      Swallow Study   General Date of Onset: 05/16/19 HPI: Jessica Harrell is an 78 y.o. female with breast CA status post mastectomy last week who was discharged home on Wednesday, did well for 2 days, then became very weak  and lethargic on Saturday with confusion and decreased po intake- diagnosed with encephalopathy. She presented to the [facility] ED, where a CT head revealed bilateral cerebellar and left occipital lobe hypoattenuation. She was outside of the window for TPA.  Pt has h/o smoking and reports she is a  current smoker.  Speech and swallow evaluation ordered.   Type of Study: Bedside Swallow Evaluation Previous Swallow Assessment: pt failed [place] Diet Prior to this Study: NPO Temperature Spikes Noted: No Respiratory Status: Nasal cannula History of Recent Intubation: No Behavior/Cognition: Lethargic/Drowsy;Other (Comment)(required total cues to awaken but participative after woke) Oral Cavity Assessment: Dried secretions Oral Care Completed by SLP: Yes(assisted pt to brush dentition, she was very ataxic ) Oral Cavity - Dentition: Adequate natural dentition Vision: (per neurologist, pt has cortical blindness) Self-Feeding Abilities: Total assist Patient Positioning: Upright in bed Baseline Vocal Quality: Normal Volitional Cough: Strong Volitional Swallow: Able to elicit    Oral/Motor/Sensory Function Overall Oral Motor/Sensory Function: Mild impairment Facial ROM: Within Functional Limits Facial Symmetry: Within Functional Limits Facial Strength: (decreased labial seal with loss of liquids anteriorally) Lingual ROM: Within Functional Limits Lingual Symmetry: Within Functional Limits Lingual Strength: Within Functional Limits Velum: Within Functional Limits   Ice Chips Ice chips: Impaired Pharyngeal Phase Impairments: Cough - Delayed Other Comments: delayed cough with expectoration of brown tinged secretions, suspect mobilized secretions from pharynx, after 2nd ice chip- no further indications of aspiration or dysphagia   Thin Liquid Thin Liquid: Impaired Presentation: Spoon;Straw Oral Phase Impairments: Reduced labial seal Oral Phase Functional Implications: (anterior loss with swallow attempt midline) Pharyngeal  Phase Impairments: Other (comments);Multiple swallows Other Comments: increased wheeze with intake    Nectar Thick Nectar Thick Liquid: Impaired Presentation: Cup;Spoon;Straw Oral Phase Impairments: Reduced labial seal Oral phase functional implications: (anterior spill  when drinking from straw) Pharyngeal Phase Impairments: Cough - Immediate;Multiple swallows Other Comments: with first bolus of liquid *after ice chips only* pt coughed - suspect discoordination of swallow/respirations but no further episodes, some wheeze noted   Honey Thick Honey Thick Liquid: Not tested   Puree Puree: Within functional limits Presentation: Spoon   Solid     Solid: Within functional limits      [name] 05/16/2019,11:53 AM   Luanna Salk, MS [facility] SLP Acute Rehab Services Pager [phone] Office [phone]

## 2019-05-16 NOTE — Progress Notes (Addendum)
STROKE TEAM PROGRESS NOTE   INTERVAL HISTORY I have personally reviewed history of presenting illness with the patient.  She presented with confusion and weakness .she has bioccipital infarcts and cortical blindness.  Vitals:   05/16/19 0322 05/16/19 0500 05/16/19 0835 05/16/19 1126  BP: 140/76 (!) 167/72 (!) 166/65 134/62  Pulse: 67 68 66 67  Resp: 19 18 16 16   Temp: 98.8 F (37.1 C) 98.8 F (37.1 C) 98.2 F (36.8 C) 98.2 F (36.8 C)  TempSrc: Oral Oral Axillary Oral  SpO2: 98% 98% 98% 99%  Weight:      Height:        CBC:  Recent Labs  Lab 05/10/19 0413 05/15/19 0910  WBC 11.9* 12.8*  NEUTROABS  --  11.5*  HGB 12.7 14.7  HCT 38.2 43.6  MCV 94.3 94.0  PLT 220 474    Basic Metabolic Panel:  Recent Labs  Lab 05/15/19 0910  NA 133*  K 3.9  CL 95*  CO2 20*  GLUCOSE 159*  BUN 16  CREATININE 0.69  CALCIUM 9.2   Lipid Panel: No results found for: CHOL, TRIG, HDL, CHOLHDL, VLDL, LDLCALC HgbA1c: No results found for: HGBA1C Urine Drug Screen: No results found for: LABOPIA, COCAINSCRNUR, LABBENZ, AMPHETMU, THCU, LABBARB  Alcohol Level No results found for: [place] elderly lady not in distress. . Afebrile. Head is nontraumatic. Neck is supple without bruit.    Cardiac exam no murmur or gallop. Lungs are clear to auscultation. Distal pulses are well felt. Neurological Exam ;  Awake  Alert oriented x 2. Diminished attention, registration and recall.. Normal speech and language.eye movements full without nystagmus.  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 Vision acuity and fields appear normal. Hearing is normal. Palatal movements are normal. Face symmetric. Tongue midline. Normal strength, tone, reflexes and coordination. Normal sensation. Gait deferred.  ASSESSMENT/PLAN [name] is a 78 y.o. female with history  of breast cancer s/p mastectomy who was discharged on Wednesday, did well for 2 days then became weak and lethargic on Saturday, developing confusion and decreased p.o. intake. Presenting to [facility] ED with CT showing bilateral cerebellar and left occipital lobe hypoattenuation.   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 No source of embolus. Negative bubble  LDL pending   HgbA1c pending   Lovenox 40 mg sq daily for VTE prophylaxis  aspirin 81 mg daily prior to admission, now on aspirin 81 mg daily. Recommend aspirin 81 mg and plavix 75 mg daily x 3 weeks, then PLAVIX alone. Orders adjusted.   Therapy recommendations: CIR/SNF  Disposition:  pending   Hypertension  Stable . Permissive hypertension (OK if < 220/120) but gradually normalize in 5-7 days . Long-term BP goal normotensive  Dysphagia  Secondary to stroke  Soft Nectar thick liquids  SLP on board   Other Stroke Risk Factors  Advanced age  Cigarette smoker, advised to stop smoking  ETOH use, advised to drink  no more than 1 drink(s) a day  Other Active Problems  Breast cancer status post left mastectomy 05/09/2019  Hospital day # 1  I have personally obtained history,examined this patient, reviewed notes, independently viewed imaging studies, participated in medical decision making and plan of care.ROS completed by me personally and pertinent positives fully documented  I have made any  additions or clarifications directly to the above note.  She presented with confusion and weakness and exam shows cortical blindness bilateral. and CT scan shows bilateral cerebellar infarcts and left occipital infarct etiology likely hypercoagulability related to cancer versus marantic endocarditis or embolism from paroxysmal A. fib.  Recommend aspirin for now and discuss goals of care with the patient and family if palliative care is due to mind will not pursue aggressive work-up.  Discussed with Dr. Dante Gang.  Greater than 50% time during this 35-minute visit was spent on counseling and coordination of care about the strokes and answering questions  Antony Contras, MD Medical Director [facility] Pager: [phone] 05/16/2019 4:57 PM   To contact Stroke Continuity provider, please refer to [email or web address] After hours, contact General Neurology

## 2019-05-16 NOTE — Evaluation (Signed)
Occupational Therapy Evaluation Patient Details Name: Jessica Harrell MRN: [id number] DOB: [birth year] Today's Date: 05/16/2019    History of Present Illness Patient is a 78 year old female admitted with acute CVA of bilateral cerebellum, L thalamus and left tectum. She is S/P bilateral mastectomy on 05/09/2019. She had gone home for 2 days before coming back to the hospital with stroke like symptoms. PMH: breast cancer, right foot pain;    Clinical Impression   Pt admitted with severe cognitive deficits, cortical visual impairments, and ataxia which impairs her ability to complete ADLs and functional transfers with PLOF. Pt is inconsistently following directions and is unaware of her visual deficits. She requires tactile cues for initiation and termination of tasks, as well as generally mod-max A for all ADLs. Eval was cut short d/t CT scan, OT will continue to assess and determine most appropriate d/c. Recommend SNF at this time.      Follow Up Recommendations  SNF;Supervision/Assistance - 24 hour    Equipment Recommendations  None recommended by OT    Recommendations for Other Services PT consult;Speech consult     Precautions / Restrictions Precautions Precautions: Fall Precaution Comments: Cortical blindness Restrictions Weight Bearing Restrictions: No      Mobility Bed Mobility Overal bed mobility: Needs Assistance Bed Mobility: Supine to Sit;Sit to Supine     Supine to sit: Min guard Sit to supine: Min guard   General bed mobility comments: Pt with ataxic, impulsive movements during transfer. Min guard required for safety  Transfers Overall transfer level: Needs assistance Equipment used: Rolling walker (2 wheeled) Transfers: Sit to/from Stand Sit to Stand: Max assist         General transfer comment: Patient required max a for stability standing. She was ataxic with her foot palcement. She required max a for proper hand placement on the walker. She transferd  sit to stand 2x.     Balance Overall balance assessment: Needs assistance Sitting-balance support: Bilateral upper extremity supported;Feet supported Sitting balance-Leahy Scale: Fair Sitting balance - Comments: needed her hands to reamin balanced    Standing balance support: Bilateral upper extremity supported Standing balance-Leahy Scale: Poor Standing balance comment: max a to remain balanced                            ADL either performed or assessed with clinical judgement   ADL Overall ADL's : Needs assistance/impaired Eating/Feeding: Maximal assistance;Sitting   Grooming: Brushing hair;Wash/dry face;Moderate assistance;Sitting Grooming Details (indicate cue type and reason): Pt able to maintain EOB sitting balance while attempting to brush hair, mod A overall to orient brush Upper Body Bathing: Moderate assistance;Sitting   Lower Body Bathing: Maximal assistance;Sit to/from stand   Upper Body Dressing : Moderate assistance;Sitting   Lower Body Dressing: Maximal assistance;Sit to/from stand                 General ADL Comments: Session interrupted by transport to CT- PT reports max A transfers     Vision Baseline Vision/History: No visual deficits Patient Visual Report: Other (comment)(per neuro, cortical visual impairment) Vision Assessment?: Yes Eye Alignment: Impaired (comment) Ocular Range of Motion: Impaired-to be further tested in functional context Alignment/Gaze Preference: Gaze right;Head tilt Tracking/Visual Pursuits: Unable to hold eye position out of midline;Requires cues, head turns, or add eye shifts to track;Impaired - to be further tested in functional context Saccades: Impaired - to be further tested in functional context Convergence: Impaired - to be  further tested in functional context Additional Comments: Difficult to formally assess 2/2 cognitive deficits and lack of awareness of cognitive deficits      Perception  Perception Perception Tested?: Yes   Praxis Praxis Praxis tested?: Deficits Deficits: Initiation;Ideation;Motor Impersistence;Perseveration;Ideomotor    Pertinent Vitals/Pain Pain Assessment: No/denies pain     Hand Dominance Right   Extremity/Trunk Assessment Upper Extremity Assessment Upper Extremity Assessment: RUE deficits/detail;LUE deficits/detail RUE Deficits / Details: Ataxic, full AROM LUE Deficits / Details: Ataxic, full AROM   Lower Extremity Assessment Lower Extremity Assessment: Defer to PT evaluation   Cervical / Trunk Assessment Cervical / Trunk Assessment: Kyphotic   Communication Communication Communication: Expressive difficulties   Cognition Arousal/Alertness: Lethargic Behavior During Therapy: Impulsive;Flat affect Overall Cognitive Status: Impaired/Different from baseline Area of Impairment: Orientation;Memory;Attention;Following commands;Safety/judgement;Awareness;Problem solving                 Orientation Level: Place;Time;Situation Current Attention Level: Focused Memory: Decreased short-term memory Following Commands: Follows one step commands inconsistently Safety/Judgement: Decreased awareness of safety;Decreased awareness of deficits Awareness: Intellectual Problem Solving: Slow processing;Decreased initiation;Difficulty sequencing;Requires verbal cues;Requires tactile cues General Comments: Per PT- Spoke to patients husband and daughter. Daughter reports that for 3-4 months now she has been having some minor memory deficits. Husband reports that her mobility and cognative status had been decreasing over the past few days.    General Comments  Pt seemed to be much more impaired compared to PT's eval earlier in day.             Home Living Family/patient expects to be discharged to:: Private residence Living Arrangements: Spouse/significant other Available Help at Discharge: Family Type of Home: House Home Access: Level entry            Bathroom Shower/Tub: Walk-in shower             Additional Comments: Information from chart, pt unable to answer  Lives With: Spouse    Prior Functioning/Environment Level of Independence: Needs assistance        Comments: Unclear        OT Problem List: Decreased activity tolerance;Impaired balance (sitting and/or standing);Impaired vision/perception;Decreased coordination;Decreased cognition;Decreased safety awareness;Decreased knowledge of use of DME or AE;Decreased knowledge of precautions      OT Treatment/Interventions: Self-care/ADL training;Therapeutic exercise;Energy conservation;DME and/or AE instruction;Modalities;Manual therapy;Therapeutic activities;Balance training;Patient/family education;Visual/perceptual remediation/compensation;Cognitive remediation/compensation    OT Goals(Current goals can be found in the care plan section) Acute Rehab OT Goals Patient Stated Goal: unable to state  OT Goal Formulation: Patient unable to participate in goal setting Time For Goal Achievement: 05/27/19 Potential to Achieve Goals: Poor  OT Frequency: Min 3X/week   Barriers to D/C: Decreased caregiver support  Pt currently requiring max A with severe cogntiive deficits           AM-PAC OT "6 Clicks" Daily Activity     Outcome Measure Help from another person eating meals?: A Lot Help from another person taking care of personal grooming?: A Lot Help from another person toileting, which includes using toliet, bedpan, or urinal?: A Lot Help from another person bathing (including washing, rinsing, drying)?: A Lot Help from another person to put on and taking off regular upper body clothing?: A Lot Help from another person to put on and taking off regular lower body clothing?: A Lot 6 Click Score: 12   End of Session Nurse Communication: Mobility status  Activity Tolerance: Patient tolerated treatment well Patient left: in bed;Other (comment)(transport taking  patient)  OT Visit Diagnosis:  Unsteadiness on feet (R26.81);Muscle weakness (generalized) (M62.81);Other symptoms and signs involving cognitive function                Time: 6384-6659 OT Time Calculation (min): 10 min Charges:  OT General Charges $OT Visit: 1 Visit OT Evaluation $OT Eval Moderate Complexity: 1 Mod  Curtis Sites OTR/L 05/16/2019, 2:36 PM

## 2019-05-16 NOTE — Progress Notes (Signed)
PROGRESS NOTE    Jessica Harrell  XVQ:[id number] DOB: 1941-04-04 DOA: 05/15/2019 PCP: [name], MD    Brief Narrative:  78 year old female with history of left-sided breast cancer and a smoker, left-sided mastectomy on 05/09/2019 discharged from hospital on 05/10/2019 presented to the emergency room with somnolence, feeling tired and generalized weakness.  In the emergency department, patient was afebrile hemodynamically stable.  WBC 12.8.  Lactic acid 4.4.  Troponin negative.  EKG sinus rhythm.  CT scan of the brain showed multifocal infarcts in the bilateral cerebellum and left occipital lobe.  MRI showed multifocal stroke and transferred to [facility].  Seen and followed by neurology.   Assessment & Plan:   Active Problems:   Acute ischemic stroke (HCC)   Acute metabolic encephalopathy  Acute multifocal stroke: In a postop patient.  With a facial droop and left-sided occipital blindness. -Suspect embolic stroke in a patient with malignancy and recent surgery. -MRI brain resulted shows multifocal stroke.  MRA of the brain with no large vessel occlusion. -2D echocardiogram essentially normal. -We will order CTA of the head and neck to look for any dissection or embolism, will cancel carotid ultrasound. -Complains of weakness and some pain on the legs, will check for any DVT. -Currently on aspirin, may need to be on anticoagulation. -Appreciate neurology input. -Continue to work with PT OT and speech.  She will be started on mechanical soft diet and nectar thick liquid. -We will refer to acute inpatient rehab. -Hemoglobin A1c pending -Lipid profile pending,   Acute metabolic encephalopathy: Due to acute a stroke.  Continue to monitor.  Patient has lactic acidosis but no evidence of infection.  Will monitor.  This is likely due to volume depletion.  Blood cultures has been done.  Left breast wound dehiscence: Status post mastectomy last week.  Seen by surgery.  Suggested  dry dressing.  Smoker: Counseled to quit.  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  Currently on letrozole.  Status post mastectomy.  DVT prophylaxis: Lovenox Code Status: Full code Family Communication: Patient's spouse and daughter were called and updated. Disposition Plan: Acute inpatient rehab   Consultants:   Neurology  Surgery  Procedures:   None  Antimicrobials:   None   Subjective: Patient was seen and examined.  She complained of difficulty looking to the left eye.  Denied any complaints.  Patient stated that she is overall weak.  No other overnight events.  Objective: Vitals:   05/16/19 0322 05/16/19 0500 05/16/19 0835 05/16/19 1126  BP: 140/76 (!) 167/72 (!) 166/65 134/62  Pulse: 67 68 66 67  Resp: 19 18 16 16   Temp: 98.8 F (37.1 C) 98.8 F (37.1 C) 98.2 F (36.8 C) 98.2 F (36.8 C)  TempSrc: Oral Oral Axillary Oral  SpO2: 98% 98% 98% 99%  Weight:      Height:        Intake/Output Summary (Last 24 hours) at 05/16/2019 1211 Last data filed at 05/16/2019 0655 Gross per 24 hour  Intake 1500 ml  Output 410 ml  Net 1090 ml   Filed Weights   05/15/19 0844 05/15/19 2121  Weight: 64 kg 64.9 kg    Examination:  General exam: Appears calm and comfortable, on room air. Respiratory system: Clear to auscultation. Respiratory effort normal. Cardiovascular system: S1 & S2 heard, RRR. No JVD, murmurs, rubs, gallops or clicks. No pedal edema.  Postsurgical wound inspected by surgery, on dressing clean and dry.  Not  removed by me. Gastrointestinal system: Abdomen is nondistended, soft and nontender. No organomegaly or masses felt. Normal bowel sounds heard. Central nervous system: Alert and oriented.  Left-sided facial droop.  No other neurological deficit. Extremities: Symmetric 5 x 5 power. Skin: No rashes, lesions or ulcers Psychiatry: Judgement and insight appear normal. Mood & affect appropriate.     Data  Reviewed: I have personally reviewed following labs and imaging studies  CBC: Recent Labs  Lab 05/10/19 0413 05/15/19 0910  WBC 11.9* 12.8*  NEUTROABS  --  11.5*  HGB 12.7 14.7  HCT 38.2 43.6  MCV 94.3 94.0  PLT 220 657   Basic Metabolic Panel: Recent Labs  Lab 05/15/19 0910  NA 133*  K 3.9  CL 95*  CO2 20*  GLUCOSE 159*  BUN 16  CREATININE 0.69  CALCIUM 9.2   GFR: Estimated Creatinine Clearance: 59.1 mL/min (by C-G formula based on SCr of 0.69 mg/dL). Liver Function Tests: Recent Labs  Lab 05/15/19 0910  AST 20  ALT 16  ALKPHOS 58  BILITOT 1.2  PROT 7.2  ALBUMIN 3.7   Recent Labs  Lab 05/15/19 0910  LIPASE 25   No results for input(s): AMMONIA in the last 168 hours. Coagulation Profile: Recent Labs  Lab 05/15/19 0920  INR 0.9   Cardiac Enzymes: Recent Labs  Lab 05/15/19 0910  TROPONINI <0.03   BNP (last 3 results) No results for input(s): PROBNP in the last 8760 hours. HbA1C: No results for input(s): HGBA1C in the last 72 hours. CBG: No results for input(s): GLUCAP in the last 168 hours. Lipid Profile: No results for input(s): CHOL, HDL, LDLCALC, TRIG, CHOLHDL, LDLDIRECT in the last 72 hours. Thyroid Function Tests: No results for input(s): TSH, T4TOTAL, FREET4, T3FREE, THYROIDAB in the last 72 hours. Anemia Panel: No results for input(s): VITAMINB12, FOLATE, FERRITIN, TIBC, IRON, RETICCTPCT in the last 72 hours. Sepsis Labs: Recent Labs  Lab 05/15/19 0910 05/15/19 1115  LATICACIDVEN 2.0* 4.4*    Recent Results (from the past 240 hour(s))  Culture, blood (Routine X 2) w Reflex to ID Panel     Status: None (Preliminary result)   Collection Time: 05/15/19  9:10 AM  Result Value Ref Range Status   Specimen Description LEFT ANTECUBITAL  Final   Special Requests   Final    BOTTLES DRAWN AEROBIC AND ANAEROBIC Blood Culture adequate volume   Culture   Final    NO GROWTH < 24 HOURS Performed at [facility], [address]  [place], [place] [address]    Report Status PENDING  Incomplete  Urine culture     Status: None   Collection Time: 05/15/19  9:11 AM  Result Value Ref Range Status   Specimen Description   Final    URINE, CLEAN CATCH Performed at [facility], [address], [place], [place] [address]    Special Requests   Final    NONE Performed at [facility], [address], [place], [place] [address]    Culture   Final    NO GROWTH Performed at [facility], [place] [address] [place], [place] [address]    Report Status 05/16/2019 FINAL  Final  SARS Coronavirus 2 (CEPHEID - Performed in [facility] hospital lab), Hosp Order     Status: None   Collection Time: 05/15/19  9:12 AM  Result Value Ref Range Status   SARS Coronavirus 2 NEGATIVE NEGATIVE Final    Comment: (NOTE) If result is NEGATIVE SARS-CoV-2 target nucleic  acids are NOT DETECTED. The SARS-CoV-2 RNA is generally detectable in upper and lower  respiratory specimens during the acute phase of infection. The lowest  concentration of SARS-CoV-2 viral copies this assay can detect is 250  copies / mL. A negative result does not preclude SARS-CoV-2 infection  and should not be used as the sole basis for treatment or other  patient management decisions.  A negative result may occur with  improper specimen collection / handling, submission of specimen other  than nasopharyngeal swab, presence of viral mutation(s) within the  areas targeted by this assay, and inadequate number of viral copies  (<250 copies / mL). A negative result must be combined with clinical  observations, patient history, and epidemiological information. If result is POSITIVE SARS-CoV-2 target nucleic acids are DETECTED. The SARS-CoV-2 RNA is generally detectable in upper and lower  respiratory specimens dur ing the acute phase of infection.  Positive  results are indicative of active infection with SARS-CoV-2.  Clinical  correlation with patient history and other  diagnostic information is  necessary to determine patient infection status.  Positive results do  not rule out bacterial infection or co-infection with other viruses. If result is PRESUMPTIVE POSTIVE SARS-CoV-2 nucleic acids MAY BE PRESENT.   A presumptive positive result was obtained on the submitted specimen  and confirmed on repeat testing.  While 2019 novel coronavirus  (SARS-CoV-2) nucleic acids may be present in the submitted sample  additional confirmatory testing may be necessary for epidemiological  and / or clinical management purposes  to differentiate between  SARS-CoV-2 and other Sarbecovirus currently known to infect humans.  If clinically indicated additional testing with an alternate test  methodology [phone]) is advised. The SARS-CoV-2 RNA is generally  detectable in upper and lower respiratory sp ecimens during the acute  phase of infection. The expected result is Negative. Fact Sheet for Patients:  StrictlyIdeas.no Fact Sheet for Healthcare Providers: BankingDealers.co.za This test is not yet approved or cleared by the [place] FDA and has been authorized for detection and/or diagnosis of SARS-CoV-2 by FDA under an Emergency Use Authorization (EUA).  This EUA will remain in effect (meaning this test can be used) for the duration of the COVID-19 declaration under Section 564(b)(1) of the Act, 21 U.S.C. section 360bbb-3(b)(1), unless the authorization is terminated or revoked sooner. Performed at [facility], [address] [place], [place] [address]   Culture, blood (Routine X 2) w Reflex to ID Panel     Status: None (Preliminary result)   Collection Time: 05/15/19  3:44 PM  Result Value Ref Range Status   Specimen Description BLOOD RIGHT WRIST  Final   Special Requests   Final    BOTTLES DRAWN AEROBIC ONLY Blood Culture adequate volume   Culture   Final    NO GROWTH < 24 HOURS Performed at [facility], [address], [place], [place] [address]    Report Status PENDING  Incomplete         Radiology Studies: Ct Head Wo Contrast  Result Date: 05/15/2019 CLINICAL DATA:  Difficulty communicating EXAM: CT HEAD WITHOUT CONTRAST TECHNIQUE: Contiguous axial images were obtained from the base of the skull through the vertex without intravenous contrast. COMPARISON:  None. FINDINGS: Brain: There are multifocal areas of decreased attenuation consistent with acute infarction involving the superior half of the cerebellum on the left and centrally within the cerebellum on the right as well as an area within the left occipital lobe. Encephalomalacia is noted in the  right frontal lobe consistent with prior ischemia. Generalized atrophy and chronic white matter ischemic changes are noted as well. Vascular: No hyperdense vessel or unexpected calcification. Skull: Normal. Negative for fracture or focal lesion. Sinuses/Orbits: No acute finding. Other: None. 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 MRI is recommended for further evaluation. 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 Francine Graven , who verbally acknowledged these results. Electronically Signed   By: Inez Catalina M.D.   On: 05/15/2019 13:17   Mr Brain Wo Contrast  Result Date: 05/16/2019 CLINICAL DATA:  Stroke.  Recent mastectomy 05/09/2019 EXAM: MRI HEAD WITHOUT CONTRAST MRA HEAD WITHOUT CONTRAST TECHNIQUE: Multiplanar, multiecho pulse sequences of the brain and surrounding structures were obtained without intravenous contrast. Angiographic images of the head were obtained using MRA technique without contrast. COMPARISON:  CT head 05/15/2019 FINDINGS: MRI HEAD FINDINGS Brain: Multiple areas of acute infarction in the posterior circulation. Relatively large superior cerebellar  infarct on the left. Moderate right superior cerebellar infarct. Acute infarcts in the occipital lobe bilaterally left greater than right. Small acute infarcts in the left thalamus. Small area of acute infarct in the left tectum. Generalized right atrophy frontal lobe. Chronic microvascular ischemic change in the white matter bilaterally. Negative for hemorrhage or mass. No hydrocephalus identified. Vascular: Normal arterial flow voids. In particular, normal flow voids in both vertebral arteries and the basilar artery and both carotid arteries. Skull and upper cervical spine: Negative Sinuses/Orbits: Paranasal sinuses clear. Bilateral cataract surgery. Other: None MRA HEAD FINDINGS Both vertebral arteries widely patent. Basilar widely patent. PICA not visualized. Small left AICA patent. Superior cerebellar arteries not visualized. Both posterior cerebral arteries are patent. Moderate stenosis left P1 segment. Mild stenosis distal posterior cerebral artery bilaterally. Cavernous carotid artery patent bilaterally with atherosclerotic irregularity. Moderate stenosis of the cavernous carotid on the left and mild stenosis supraclinoid internal carotid artery on the right. Both anterior cerebral arteries patent. M1 segments patent bilaterally. Moderate to severe disease in the M2 branches bilaterally. Negative for aneurysm. IMPRESSION: 1. Multiple large areas of acute infarction in the posterior circulation involving the cerebellum bilaterally, occipital lobes bilaterally, left thalamus, and left tectum. No hemorrhage. Findings may be due to posterior circulation emboli. Suggest CTA head and neck for further evaluation of the arterial circulation. 2. Diffuse intracranial atherosclerotic disease. Electronically Signed   By: Franchot Gallo M.D.   On: 05/16/2019 08:41   Dg Chest Port 1 View  Result Date: 05/15/2019 CLINICAL DATA:  Weakness EXAM: PORTABLE CHEST 1 VIEW COMPARISON:  09/16/2015 FINDINGS: Heart is normal size.  Mild peribronchial thickening and interstitial prominence, likely mild bronchitic changes. No effusions or acute bony abnormality. IMPRESSION: Peribronchial thickening and interstitial prominence suggestive of bronchitic changes. Electronically Signed   By: Rolm Baptise M.D.   On: 05/15/2019 09:57   Mr Jodene Nam Head/brain NG Cm  Result Date: 05/16/2019 CLINICAL DATA:  Stroke.  Recent mastectomy 05/09/2019 EXAM: MRI HEAD WITHOUT CONTRAST MRA HEAD WITHOUT CONTRAST TECHNIQUE: Multiplanar, multiecho pulse sequences of the brain and surrounding structures were obtained without intravenous contrast. Angiographic images of the head were obtained using MRA technique without contrast. COMPARISON:  CT head 05/15/2019 FINDINGS: MRI HEAD FINDINGS Brain: Multiple areas of acute infarction in the posterior circulation. Relatively large superior cerebellar infarct on the left. Moderate right superior cerebellar infarct. Acute infarcts in the occipital lobe bilaterally  left greater than right. Small acute infarcts in the left thalamus. Small area of acute infarct in the left tectum. Generalized right atrophy frontal lobe. Chronic microvascular ischemic change in the white matter bilaterally. Negative for hemorrhage or mass. No hydrocephalus identified. Vascular: Normal arterial flow voids. In particular, normal flow voids in both vertebral arteries and the basilar artery and both carotid arteries. Skull and upper cervical spine: Negative Sinuses/Orbits: Paranasal sinuses clear. Bilateral cataract surgery. Other: None MRA HEAD FINDINGS Both vertebral arteries widely patent. Basilar widely patent. PICA not visualized. Small left AICA patent. Superior cerebellar arteries not visualized. Both posterior cerebral arteries are patent. Moderate stenosis left P1 segment. Mild stenosis distal posterior cerebral artery bilaterally. Cavernous carotid artery patent bilaterally with atherosclerotic irregularity. Moderate stenosis of the cavernous  carotid on the left and mild stenosis supraclinoid internal carotid artery on the right. Both anterior cerebral arteries patent. M1 segments patent bilaterally. Moderate to severe disease in the M2 branches bilaterally. Negative for aneurysm. IMPRESSION: 1. Multiple large areas of acute infarction in the posterior circulation involving the cerebellum bilaterally, occipital lobes bilaterally, left thalamus, and left tectum. No hemorrhage. Findings may be due to posterior circulation emboli. Suggest CTA head and neck for further evaluation of the arterial circulation. 2. Diffuse intracranial atherosclerotic disease. Electronically Signed   By: Franchot Gallo M.D.   On: 05/16/2019 08:41        Scheduled Meds:   stroke: mapping our early stages of recovery book   Does not apply Once   aspirin EC  81 mg Oral Daily   cholecalciferol  1,000 Units Oral Daily   enoxaparin (LOVENOX) injection  40 mg Subcutaneous Q24H   letrozole  2.5 mg Oral Daily   Continuous Infusions:  sodium chloride 75 mL/hr at 05/15/19 1701   sodium chloride 75 mL/hr at 05/15/19 1844   sodium chloride       LOS: 1 day    Time spent: 25 minutes    Barb Merino, MD [facility] Pager [phone]  If 7PM-7AM, please contact night-coverage [email or web address] Password [email or web address] 05/16/2019, 12:11 PM

## 2019-05-16 NOTE — Evaluation (Signed)
Physical Therapy Evaluation Patient Details Name: Jessica Harrell MRN: [id number] DOB: 1941/08/19 Today's Date: 05/16/2019   History of Present Illness  Patient is a 78 year old female S/P acute CVA of bilateral cerebellum, L thalamus and left tectum. She is S/P bilateral mastectomy on 05/09/2019. She had gone home for 2 days before coming back to the hospital with stroke like symptoms. PMH: breast cancer, right foot pain;   Clinical Impression  Patient presents with ataxic gait. She required max cuing for hand placement and max a to remain balanced with gait. Therapy spoke with patients husband who reports he has his own mobility isssues. She had required increasing assistance at home over th past week. She required max a for balance with sit to stand transfers. She would benefit from a more intensive rehab program at [facility] to improve her ability to to go home with her husband. She reports she was walking without a walker prior to her CVA .      CIR;SNF    Equipment Recommendations  None reccommended     Recommendations for Other Services Rehab consult     Precautions / Restrictions Precautions Precautions: Fall Precaution Comments: appears to be having visual deficits  Restrictions Weight Bearing Restrictions: No      Mobility  Bed Mobility Overal bed mobility: Needs Assistance Bed Mobility: Supine to Sit;Sit to Supine     Supine to sit: Min guard Sit to supine: Mod assist   General bed mobility comments: Mod a to lift LE back into the bed. Min gaurd to sit up at the edge of the bed.   Transfers Overall transfer level: Needs assistance Equipment used: Rolling walker (2 wheeled) Transfers: Sit to/from Stand Sit to Stand: Max assist         General transfer comment: Patient required max a for stability standing. She was ataxic with her foot palcement. She required max a for proper hand placement on the walker. She transferd sit to stand 2x.    Ambulation/Gait Ambulation/Gait assistance: Max assist Gait Distance (Feet): 2 Feet Assistive device: Rolling walker (2 wheeled) Gait Pattern/deviations: Ataxic Gait velocity: decreased   General Gait Details: Patient took 2 stpes form the bed but had difficulty coordinating her steps. She did not walk away from the bed 2nd to safety.   Stairs            Wheelchair Mobility    Modified Rankin (Stroke Patients Only) Modified Rankin (Stroke Patients Only) Pre-Morbid Rankin Score: Moderate disability Modified Rankin: Severe disability     Balance Overall balance assessment: Needs assistance Sitting-balance support: Bilateral upper extremity supported;Feet supported Sitting balance-Leahy Scale: Fair Sitting balance - Comments: needed her hands to reamin balanced    Standing balance support: Bilateral upper extremity supported Standing balance-Leahy Scale: Poor Standing balance comment: max a to remain balanced                              Pertinent Vitals/Pain Pain Assessment: No/denies pain    Home Living Family/patient expects to be discharged to:: Private residence Living Arrangements: Spouse/significant other Available Help at Discharge: Family Type of Home: House Home Access: Level entry         Additional Comments: Patient answered all questions about her house but it may be helpful to talk to husband     Prior Function Level of Independence: Needs assistance         Comments: Patient was home rehabing  from her mastectomy. At first she reported she was up and able to walk to the bathroom. When asked again later she reported she had not been getting up.      Hand Dominance   Dominant Hand: Right    Extremity/Trunk Assessment   Upper Extremity Assessment Upper Extremity Assessment: Defer to OT evaluation    Lower Extremity Assessment Lower Extremity Assessment: Generalized weakness    Cervical / Trunk Assessment Cervical /  Trunk Assessment: Normal  Communication      Cognition Arousal/Alertness: Awake/alert   Overall Cognitive Status: Impaired/Different from baseline                                 General Comments: Spoke to patients husband and daughter. Daughter reports that for 3-4 months now she has been having some minor memory deficits. Husband reports that her mobility and cognative status had been decreasing over the past few days.       General Comments General comments (skin integrity, edema, etc.): decreased eye tracking; Difficulty asssesing finger to nose 2nd to sitting balance     Exercises     Assessment/Plan    PT Assessment Patient needs continued PT services  PT Problem List Decreased strength;Decreased activity tolerance;Decreased balance;Decreased mobility;Decreased coordination;Decreased cognition;Decreased knowledge of use of DME;Decreased safety awareness       PT Treatment Interventions DME instruction;Gait training;Stair training;Functional mobility training;Therapeutic exercise;Therapeutic activities;Balance training;Neuromuscular re-education;Patient/family education    PT Goals (Current goals can be found in the Care Plan section)  Acute Rehab PT Goals Patient Stated Goal: unable to state  PT Goal Formulation: With patient    Frequency Min 4X/week   Barriers to discharge Decreased caregiver support Spoke with husband who reports he has his own mobility issues     Co-evaluation               AM-PAC PT "6 Clicks" Mobility  Outcome Measure Help needed turning from your back to your side while in a flat bed without using bedrails?: A Little Help needed moving from lying on your back to sitting on the side of a flat bed without using bedrails?: A Little Help needed moving to and from a bed to a chair (including a wheelchair)?: A Lot Help needed standing up from a chair using your arms (e.g., wheelchair or bedside chair)?: A Lot Help needed to walk  in hospital room?: Total Help needed climbing 3-5 steps with a railing? : Total 6 Click Score: 12    End of Session Equipment Utilized During Treatment: Gait belt Activity Tolerance: Patient limited by fatigue Patient left: in bed;with call bell/phone within reach;with chair alarm set Nurse Communication: Mobility status PT Visit Diagnosis: Other abnormalities of gait and mobility (R26.89);Muscle weakness (generalized) (M62.81);Difficulty in walking, not elsewhere classified (R26.2)    Time: 1120-1140 PT Time Calculation (min) (ACUTE ONLY): 20 min   Charges:   PT Evaluation $PT Eval Moderate Complexity: 1 Mod           Carney Living 05/16/2019, 1:17 PM

## 2019-05-16 NOTE — Evaluation (Signed)
Speech Language Pathology Evaluation Patient Details Name: Jessica Harrell MRN: [id number] DOB: [birth year] Today's Date: 05/16/2019 Time: 3244-0102 SLP Time Calculation (min) (ACUTE ONLY): 28 min  Problem List:  Patient Active Problem List   Diagnosis Date Noted  . Acute ischemic stroke ([place]) 05/15/2019  . Acute metabolic encephalopathy 72/53/6644  . Encephalopathy   . History of left breast cancer 05/09/2019  . Malignant neoplasm of lower-inner quadrant of left breast in female, estrogen receptor positive ([place]) 04/26/2018   Past Medical History:  Past Medical History:  Diagnosis Date  . Breast cancer ([place])   . Right foot pain Nov. 2014   Past Surgical History:  Past Surgical History:  Procedure Laterality Date  . BREAST BIOPSY    . CATARACT EXTRACTION     both eyes  . EYE SURGERY Left   . MASTECTOMY W/ SENTINEL NODE BIOPSY Left 05/09/2019   Procedure: LEFT MASTECTOMY WITH LEFT AXILLARY SENTINEL LYMPH NODE BIOPSY;  Surgeon: Coralie Keens, MD;  Location: [facility];  Service: General;  Laterality: Left;   HPI:  Jessica Harrell is an 78 y.o. female with breast CA status post mastectomy last week who was discharged home on Wednesday, did well for 2 days, then became very weak and lethargic on Saturday with confusion and decreased po intake- diagnosed with encephalopathy. She presented to the [facility] ED, where a CT head revealed bilateral cerebellar and left occipital lobe hypoattenuation. She was outside of the window for TPA.  Pt has h/o smoking and reports she is a current smoker.  Speech and swallow evaluation ordered.     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    She currently presents with significant cognitive linguistic deficits c/b decrease attention, memory, difficulties with numbers including her age and number of years  married, as well as orientation.  She did not recall information 3 minutes after being informed.  Her speech is clear initially but can become dysarthric as she fatigues.    She will benefit from dedicated SLP to maximize her cognitive linguistic skills.  SlP built up her call button with tape due to her visual deficits and had pt practice using several times.  She has difficulty with controlling sustained pressure - not letting go of items, etc which may cause difficulty with using call bell functionally.      SLP Assessment  SLP Recommendation/Assessment: Patient needs continued Speech Lanaguage Pathology Services SLP Visit Diagnosis: Cognitive communication deficit (R41.841);Attention and concentration deficit Attention and concentration deficit following: Cerebral infarction    Follow Up Recommendations  Skilled Nursing facility    Frequency and Duration min 2x/week  1 week      SLP Evaluation Cognition  Overall Cognitive Status: No family/caregiver present to determine baseline cognitive functioning(suspect worse than baseline however given cognitive deficits a few days prior to admission) Arousal/Alertness: Awake/alert Orientation Level: Oriented to person;Disoriented to place;Disoriented to time;Oriented to situation Attention: Sustained Sustained Attention: Appears intact(for basic tasks) Memory: Impaired Memory Impairment: Storage deficit;Retrieval deficit;Decreased recall of new information(did not recall location in hospital or her cortical blindness per neuro after a few minutes) Awareness: Impaired Awareness Impairment: Intellectual impairment(not aware of being blind, reports seeing but unable, guessing at what you are asking her to describe) Problem Solving: Impaired       Comprehension  Auditory Comprehension Overall Auditory Comprehension: Appears within functional limits for tasks assessed Yes/No Questions: Not  tested Commands: Within Functional Limits(for two step  commands) Conversation: Simple Interfering Components: Visual impairments;Processing speed;Working Marine scientist;Motor planning EffectiveTechniques: Stressing words;Slowed speech;Extra processing time Visual Recognition/Discrimination Discrimination: Not tested(pt with cortical blindness) Reading Comprehension Reading Status: Not tested(pt with cortical blindness)    Expression Expression Primary Mode of Expression: Verbal Verbal Expression Overall Verbal Expression: Appears within functional limits for tasks assessed Initiation: No impairment Level of Generative/Spontaneous Verbalization: Sentence Repetition: (dnt) Naming: Not tested(pt unable to see) Pragmatics: No impairment Written Expression Dominant Hand: Right Written Expression: Not tested   Oral / Motor  Oral Motor/Sensory Function Overall Oral Motor/Sensory Function: Mild impairment Facial ROM: Within Functional Limits Facial Symmetry: Within Functional Limits Facial Strength: (decreased labial seal with loss of liquids anteriorally) Lingual ROM: Within Functional Limits Lingual Symmetry: Within Functional Limits Lingual Strength: Within Functional Limits Velum: Within Functional Limits Motor Speech Overall Motor Speech: Appears within functional limits for tasks assessed Respiration: Within functional limits Resonance: Within functional limits Articulation: Within functional limitis Intelligibility: Intelligible Motor Planning: Witnin functional limits Motor Speech Errors: Not applicable   GO                    [name] 05/16/2019, 12:06 PM   Luanna Salk, [facility] SLP Acute Rehab Services Pager [phone] Office [phone]

## 2019-05-17 ENCOUNTER — Inpatient Hospital Stay (HOSPITAL_COMMUNITY): Payer: Medicare Other

## 2019-05-17 ENCOUNTER — Encounter (HOSPITAL_COMMUNITY): Payer: Medicare Other

## 2019-05-17 DIAGNOSIS — I639 Cerebral infarction, unspecified: Secondary | ICD-10-CM

## 2019-05-17 DIAGNOSIS — R52 Pain, unspecified: Secondary | ICD-10-CM

## 2019-05-17 DIAGNOSIS — M7989 Other specified soft tissue disorders: Secondary | ICD-10-CM

## 2019-05-17 LAB — LIPID PANEL
Cholesterol: 215 mg/dL — ABNORMAL HIGH (ref 0–200)
HDL: 46 mg/dL (ref >40)
LDL Cholesterol: 139 mg/dL — ABNORMAL HIGH (ref 0–99)
Total CHOL/HDL Ratio: 4.7 RATIO
Triglycerides: 148 mg/dL (ref <150)
VLDL: 30 mg/dL (ref 0–40)

## 2019-05-17 LAB — HEMOGLOBIN A1C
Hgb A1c MFr Bld: 5.6 % (ref 4.8–5.6)
Mean Plasma Glucose: 114.02 mg/dL

## 2019-05-17 MED ORDER — ROSUVASTATIN CALCIUM 5 MG PO TABS
10.0000 mg | ORAL_TABLET | Freq: Every day | ORAL | Status: DC
  Administered 2019-05-17 – 2019-05-19 (×3): 10 mg via ORAL
  Filled 2019-05-17 (×3): qty 2

## 2019-05-17 NOTE — Progress Notes (Signed)
Inpatient Rehabilitation Admissions Coordinator  Inpatient Rehab Consult received. I met with patient at the bedside for rehabilitation assessment. I then contacted her daughter, [place], to discuss rehab options, goals and expectations of rehab and caregiver support at home. Pt's spouse unable to provide the level of care projected after a short rehab stay. I recommend SNF rehab for prolonged recovery and concur with therapy recommendations. Daughter in agreement. I will contact SW and sign off at this time.  Danne Baxter, RN, MSN Rehab Admissions Coordinator [phone] 05/17/2019 10:13 AM

## 2019-05-17 NOTE — Progress Notes (Signed)
Patient ID: Jessica Harrell, female   DOB: March 28, 1941, 78 y.o.   MRN: [id number]   Pt stable Will keep dry bandage on wound and binder I plan on removing her drain on Friday.  It continues to hold suction

## 2019-05-17 NOTE — Progress Notes (Signed)
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  In the emergency department, patient was afebrile hemodynamically stable.  WBC 12.8.  Lactic acid 4.4.  Troponin negative.  EKG sinus rhythm.  CT scan of the brain showed multifocal infarcts in the bilateral cerebellum and left occipital lobe.  MRI showed multifocal stroke and transferred to [facility].  Seen and followed by neurology. 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   Assessment & Plan:   Active Problems:   Acute ischemic stroke (HCC)   Acute metabolic encephalopathy  Acute multifocal stroke: In a postop patient.  With cortical blindness. -Suspect embolic stroke in a patient with malignancy and recent surgery. -MRI brain resulted shows multifocal stroke.  MRA of the brain with no large vessel occlusion. -2D echocardiogram essentially normal. -CTA head and neck did not demonstrate any dissection or embolism.   -Currently on aspirin, may need to be on anticoagulation. -Appreciate neurology input. -Continue to work with PT OT and speech.  She will be started on mechanical soft diet and nectar thick liquid. -We will refer to acute inpatient rehab. -Hemoglobin A1c 5.6 -LDL 139 Patient with cancer, now with cortical blindness.  Previously functional.  Discussed with patient, her oncologist and patient's daughter.  Since patient was functional before this event, they suggested all aggressive management available. CT scan of the neck showed some possible 1.4 cm mass on the right upper lobe.  Will do dedicated CT chest. If negative for metastatic disease in the lungs, will consult  cardiology for TEE in the morning.  Acute metabolic encephalopathy: Due to acute stroke.  Continue to monitor.  Patient has lactic acidosis but no evidence of infection.  Will monitor.  This is likely due to volume depletion.  Blood cultures has been done.  Left breast wound dehiscence: Status post mastectomy last week.  Seen by surgery.  Suggested dry dressing.  Smoker: Counseled to quit.  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  Currently on letrozole.  Status post mastectomy.  I called and discussed case with her oncologist.  They will see her.  I called and updated patient's oncologist Dr. Lindi Adie about patient's current status.  Patient had mastectomy with clear margins and no evidence of metastatic disease as per them. Called and discussed with patient's daughter.  She stated patient has enough support at home and had been functional before this events.  She thinks if appropriate indication patient should be on blood thinner because they can do everything to take fall precautions.  They are okay with all aggressive treatments and investigations.  DVT prophylaxis: Lovenox Code Status: Full code Family Communication: Patient's daughter were called and updated. Disposition Plan: Acute inpatient rehab to hospitalization   Consultants:   Neurology  Surgery  Oncology  Procedures:   None  Antimicrobials:   None   Subjective: Patient was seen and examined.  No overnight events.  Patient cannot see.  Objective: Vitals:   05/16/19 1949 05/16/19 2357 05/17/19 0346 05/17/19 0800  BP: 104/67 111/71 103/64 134/86  Pulse: 66 69 66 73  Resp: 18 18 18 16   Temp: 98 F (36.7 C) 97.8 F (36.6 C) 97.7 F (36.5 C) 99  F (37.2 C)  TempSrc: Axillary Oral Axillary Oral  SpO2: 98% 98% 98% 98%  Weight:      Height:        Intake/Output Summary (Last 24 hours) at 05/17/2019 0948 Last data filed at 05/17/2019 0600 Gross per 24 hour  Intake  2425.88 ml  Output 1140 ml  Net 1285.88 ml   Filed Weights   05/15/19 0844 05/15/19 2121  Weight: 64 kg 64.9 kg    Examination:  General exam: Appears calm and comfortable, on room air.  Slightly confused and anxious because of inability to see. Respiratory system: Clear to auscultation. Respiratory effort normal. Cardiovascular system: S1 & S2 heard, RRR. No JVD, murmurs, rubs, gallops or clicks. No pedal edema.  Postsurgical wound inspected by surgery, on dressing clean and dry.  Not removed by me. Gastrointestinal system: Abdomen is nondistended, soft and nontender. No organomegaly or masses felt. Normal bowel sounds heard. Central nervous system: Alert and oriented.  Left-sided facial droop.  Blindness both eyes, can see finger waving. Extremities: Symmetric 5 x 5 power. Skin: No rashes, lesions or ulcers Psychiatry: Judgement and insight appear normal. Mood & affect anxious.    Data Reviewed: I have personally reviewed following labs and imaging studies  CBC: Recent Labs  Lab 05/15/19 0910  WBC 12.8*  NEUTROABS 11.5*  HGB 14.7  HCT 43.6  MCV 94.0  PLT 789   Basic Metabolic Panel: Recent Labs  Lab 05/15/19 0910  NA 133*  K 3.9  CL 95*  CO2 20*  GLUCOSE 159*  BUN 16  CREATININE 0.69  CALCIUM 9.2   GFR: Estimated Creatinine Clearance: 59.1 mL/min (by C-G formula based on SCr of 0.69 mg/dL). Liver Function Tests: Recent Labs  Lab 05/15/19 0910  AST 20  ALT 16  ALKPHOS 58  BILITOT 1.2  PROT 7.2  ALBUMIN 3.7   Recent Labs  Lab 05/15/19 0910  LIPASE 25   No results for input(s): AMMONIA in the last 168 hours. Coagulation Profile: Recent Labs  Lab 05/15/19 0920  INR 0.9   Cardiac Enzymes: Recent Labs  Lab 05/15/19 0910  TROPONINI <0.03   BNP (last 3 results) No results for input(s): PROBNP in the last 8760 hours. HbA1C: Recent Labs    05/17/19 0419  HGBA1C 5.6   CBG: No results for input(s): GLUCAP in the last 168 hours. Lipid  Profile: Recent Labs    05/17/19 0419  CHOL 215*  HDL 46  LDLCALC 139*  TRIG 148  CHOLHDL 4.7   Thyroid Function Tests: No results for input(s): TSH, T4TOTAL, FREET4, T3FREE, THYROIDAB in the last 72 hours. Anemia Panel: No results for input(s): VITAMINB12, FOLATE, FERRITIN, TIBC, IRON, RETICCTPCT in the last 72 hours. Sepsis Labs: Recent Labs  Lab 05/15/19 0910 05/15/19 1115  LATICACIDVEN 2.0* 4.4*    Recent Results (from the past 240 hour(s))  Culture, blood (Routine X 2) w Reflex to ID Panel     Status: None (Preliminary result)   Collection Time: 05/15/19  9:10 AM  Result Value Ref Range Status   Specimen Description LEFT ANTECUBITAL  Final   Special Requests   Final    BOTTLES DRAWN AEROBIC AND ANAEROBIC Blood Culture adequate volume   Culture   Final    NO GROWTH 2 DAYS Performed at [facility], [address] [place], [place] [address]    Report Status PENDING  Incomplete  Urine culture     Status: None   Collection Time: 05/15/19  9:11 AM  Result Value Ref Range Status   Specimen Description   Final    URINE, CLEAN CATCH Performed at [facility], [address] [place], [place] [address]    Special Requests   Final    NONE Performed at [facility], [address], [place], [place] [address]    Culture   Final    NO GROWTH Performed at [facility], [place] [address], [place], [place] [address]    Report Status 05/16/2019 FINAL  Final  SARS Coronavirus 2 (CEPHEID - Performed in [place] hospital lab), Hosp Order     Status: None   Collection Time: 05/15/19  9:12 AM  Result Value Ref Range Status   SARS Coronavirus 2 NEGATIVE NEGATIVE Final    Comment: (NOTE) If result is NEGATIVE SARS-CoV-2 target nucleic acids are NOT DETECTED. The SARS-CoV-2 RNA is generally detectable in upper and lower  respiratory specimens during the acute phase of infection. The lowest  concentration of SARS-CoV-2 viral copies this assay can detect is 250  copies / mL.  A negative result does not preclude SARS-CoV-2 infection  and should not be used as the sole basis for treatment or other  patient management decisions.  A negative result may occur with  improper specimen collection / handling, submission of specimen other  than nasopharyngeal swab, presence of viral mutation(s) within the  areas targeted by this assay, and inadequate number of viral copies  (<250 copies / mL). A negative result must be combined with clinical  observations, patient history, and epidemiological information. If result is POSITIVE SARS-CoV-2 target nucleic acids are DETECTED. The SARS-CoV-2 RNA is generally detectable in upper and lower  respiratory specimens dur ing the acute phase of infection.  Positive  results are indicative of active infection with SARS-CoV-2.  Clinical  correlation with patient history and other diagnostic information is  necessary to determine patient infection status.  Positive results do  not rule out bacterial infection or co-infection with other viruses. If result is PRESUMPTIVE POSTIVE SARS-CoV-2 nucleic acids MAY BE PRESENT.   A presumptive positive result was obtained on the submitted specimen  and confirmed on repeat testing.  While 2019 novel coronavirus  (SARS-CoV-2) nucleic acids may be present in the submitted sample  additional confirmatory testing may be necessary for epidemiological  and / or clinical management purposes  to differentiate between  SARS-CoV-2 and other Sarbecovirus currently known to infect humans.  If clinically indicated additional testing with an alternate test  methodology [phone]) is advised. The SARS-CoV-2 RNA is generally  detectable in upper and lower respiratory sp ecimens during the acute  phase of infection. The expected result is Negative. Fact Sheet for Patients:  StrictlyIdeas.no Fact Sheet for Healthcare Providers: BankingDealers.co.za This test is not  yet approved or cleared by the [place] FDA and has been authorized for detection and/or diagnosis of SARS-CoV-2 by FDA under an Emergency Use Authorization (EUA).  This EUA will remain in effect (meaning this test can be used) for the duration of the COVID-19 declaration under Section 564(b)(1) of the Act, 21 U.S.C. section 360bbb-3(b)(1), unless the authorization is terminated or revoked sooner. Performed at [facility], [address], [place], [place] [address]   Culture, blood (Routine X 2) w Reflex to ID Panel     Status: None (Preliminary result)   Collection Time: 05/15/19  3:44 PM  Result Value Ref Range Status   Specimen Description BLOOD RIGHT WRIST  Final   Special Requests  Final    BOTTLES DRAWN AEROBIC ONLY Blood Culture adequate volume   Culture   Final    NO GROWTH 2 DAYS Performed at [facility], [address], [place],  [address]    Report Status PENDING  Incomplete         Radiology Studies: Ct Angio Head W Or Wo Contrast  Result Date: 05/16/2019 CLINICAL DATA:  Follow-up multiple posterior circulation infarctions. EXAM: CT ANGIOGRAPHY HEAD AND NECK TECHNIQUE: Multidetector CT imaging of the head and neck was performed using the standard protocol during bolus administration of intravenous contrast. Multiplanar CT image reconstructions and MIPs were obtained to evaluate the vascular anatomy. Carotid stenosis measurements (when applicable) are obtained utilizing NASCET criteria, using the distal internal carotid diameter as the denominator. CONTRAST:  147mL OMNIPAQUE IOHEXOL 350 MG/ML SOLN COMPARISON:  CT yesterday.  MRI studies today. FINDINGS: CT HEAD FINDINGS Brain: Discrete low-density redemonstrated in the areas of acute posterior circulation infarction including both cerebellar hemispheres more extensive on the left than the right, both occipital lobes left more than right. Small foci of acute infarction seen within the left thalamus, left splenium of  the corpus callosum and left tectum are not specifically appreciated by CT. Old small vessel thalamic infarctions are present. Old small vessel basal ganglia infarctions are present. Old right frontal cortical and subcortical infarction. Old small left parietal vertex infarction. No evidence of interval hemorrhage. Cerebellar swelling causes some mass-effect upon the fourth ventricle but there is no sign of ventricular obstruction at this time. Vascular: There is atherosclerotic calcification of the major vessels at the base of the brain. Skull: Negative Sinuses: Clear Orbits: Negative CTA NECK FINDINGS Aortic arch: Aortic atherosclerosis. No aneurysm or dissection. Branching pattern is normal. No flow limiting origin stenosis. Right carotid system: Common carotid artery shows some atherosclerotic plaque but is widely patent to the bifurcation. At the carotid bifurcation, there is soft and calcified plaque. Minimal diameter of the proximal ICA is 3.5 mm. Compared to a more distal cervical ICA diameter of 5 mm, this indicates a 30% stenosis. Left carotid system: Common carotid artery shows some scattered plaque but is widely patent to the bifurcation region. There is calcified plaque at the carotid bifurcation and ICA bulb but no stenosis relative to the more distal cervical ICA. Vertebral arteries: Both subclavian arteries are sufficiently patent. There is atherosclerotic plaque at the left vertebral artery origin with stenosis of 50%. Beyond that, the vessel is tortuous and shows atherosclerotic plaque but no stenosis greater than 30%. No evidence of left vertebral dissection or occlusion. Right vertebral artery is the larger vessel. No origin stenosis. The vessel is tortuous proximally and shows some areas of atherosclerosis but no stenosis greater than 30% suspected. In the lower cervical region, the vessel is quite tortuous but likely sufficiently patent. Skeleton: Chronic spinal curvature, degenerative  spondylosis and facet osteoarthritis. Other neck: No mass or lymphadenopathy. Upper chest: Bilateral pleural and parenchymal scarring. Asymmetric density in the right upper lobe laterally measuring up to 1.4 cm should probably be evaluated by complete chest CT. There is air within the left chest wall, etiology uncertain. I do not see a pneumothorax on the left. I do not see fracture of the upper left ribs. Review of the MIP images confirms the above findings CTA HEAD FINDINGS Anterior circulation: Both internal carotid arteries are patent through the skull base and siphon regions. There is atherosclerotic calcification throughout both carotid siphon regions with stenosis estimated at 50% on each side. The  anterior and middle cerebral vessels are patent without proximal stenosis, aneurysm or vascular malformation. Posterior circulation: Both vertebral arteries are patent at the foramen magnum. There is mild atherosclerotic change in both V4 segments but no stenosis greater than 20%. Right posterior inferior cerebellar artery appears to show flow. Left PICA is probably occluded. Both vertebral arteries reach the basilar. In the distal basilar, there appears to be a small filling defect anteriorly consistent with an embolus. Flow is present in both superior cerebellar arteries and both posterior cerebral arteries presently. Venous sinuses: Patent and normal. Anatomic variants: None significant Delayed phase: Not performed IMPRESSION: Aortic atherosclerosis. Atherosclerosis of the brachiocephalic vessel origins without flow limiting stenosis. Atherosclerotic disease at both carotid bifurcations. 30% stenosis on the right. No stenosis on the left. Atherosclerotic disease in both carotid siphon regions with stenosis estimated at 50% on each side. Intracranial anterior circulation branch vessels are patent without correctable proximal stenosis. No occluded large or medium vessels are seen in the anterior circulation.  Atherosclerotic disease at both vertebral artery origins and the proximal 2 cm. 50% stenosis of the left vertebral origin. Both proximal vertebral arteries show tortuosity and slight irregularity but without flow limiting stenosis. Stenosis in those regions may approach 30%. Both vertebral arteries are patent to the basilar. Minimal atherosclerosis in both V4 segments but without stenosis greater than 25%. Small filling defect in the distal basilar artery along the anterior margin probably representing a small embolus or embolus remnant. Flow is present currently within both posterior cerebral arteries, both superior cerebellar arteries, both anterior inferior cerebellar arteries and at least the right posterior inferior cerebellar artery. There may be diminished or absent flow in the left PICA. Air/gas in the left chest wall, etiology unknown. I do not see a left pneumothorax or upper chest fracture. Was there a left subclavian central line attempt? Pleural and parenchymal scarring at both lung apices. Asymmetric density at the right upper lobe measuring up to 1.4 cm in diameter. Mass lesion not excluded. Complete chest CT would be suggested at some point. Electronically Signed   By: Nelson Chimes M.D.   On: 05/16/2019 15:15   Ct Head Wo Contrast  Result Date: 05/15/2019 CLINICAL DATA:  Difficulty communicating EXAM: CT HEAD WITHOUT CONTRAST TECHNIQUE: Contiguous axial images were obtained from the base of the skull through the vertex without intravenous contrast. COMPARISON:  None. FINDINGS: Brain: There are multifocal areas of decreased attenuation consistent with acute infarction involving the superior half of the cerebellum on the left and centrally within the cerebellum on the right as well as an area within the left occipital lobe. Encephalomalacia is noted in the right frontal lobe consistent with prior ischemia. Generalized atrophy and chronic white matter ischemic changes are noted as well. Vascular: No  hyperdense vessel or unexpected calcification. Skull: Normal. Negative for fracture or focal lesion. Sinuses/Orbits: No acute finding. Other: None. 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 MRI is recommended for further evaluation. 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 Francine Graven , who verbally acknowledged these results. Electronically Signed   By: Inez Catalina M.D.   On: 05/15/2019 13:17   Ct Angio Neck W Or Wo Contrast  Result Date: 05/16/2019 CLINICAL DATA:  Follow-up multiple posterior circulation infarctions. EXAM: CT ANGIOGRAPHY HEAD AND NECK TECHNIQUE: Multidetector CT imaging of the head and neck was performed  using the standard protocol during bolus administration of intravenous contrast. Multiplanar CT image reconstructions and MIPs were obtained to evaluate the vascular anatomy. Carotid stenosis measurements (when applicable) are obtained utilizing NASCET criteria, using the distal internal carotid diameter as the denominator. CONTRAST:  111mL OMNIPAQUE IOHEXOL 350 MG/ML SOLN COMPARISON:  CT yesterday.  MRI studies today. FINDINGS: CT HEAD FINDINGS Brain: Discrete low-density redemonstrated in the areas of acute posterior circulation infarction including both cerebellar hemispheres more extensive on the left than the right, both occipital lobes left more than right. Small foci of acute infarction seen within the left thalamus, left splenium of the corpus callosum and left tectum are not specifically appreciated by CT. Old small vessel thalamic infarctions are present. Old small vessel basal ganglia infarctions are present. Old right frontal cortical and subcortical infarction. Old small left parietal vertex infarction. No evidence of interval hemorrhage. Cerebellar swelling causes some mass-effect upon the fourth  ventricle but there is no sign of ventricular obstruction at this time. Vascular: There is atherosclerotic calcification of the major vessels at the base of the brain. Skull: Negative Sinuses: Clear Orbits: Negative CTA NECK FINDINGS Aortic arch: Aortic atherosclerosis. No aneurysm or dissection. Branching pattern is normal. No flow limiting origin stenosis. Right carotid system: Common carotid artery shows some atherosclerotic plaque but is widely patent to the bifurcation. At the carotid bifurcation, there is soft and calcified plaque. Minimal diameter of the proximal ICA is 3.5 mm. Compared to a more distal cervical ICA diameter of 5 mm, this indicates a 30% stenosis. Left carotid system: Common carotid artery shows some scattered plaque but is widely patent to the bifurcation region. There is calcified plaque at the carotid bifurcation and ICA bulb but no stenosis relative to the more distal cervical ICA. Vertebral arteries: Both subclavian arteries are sufficiently patent. There is atherosclerotic plaque at the left vertebral artery origin with stenosis of 50%. Beyond that, the vessel is tortuous and shows atherosclerotic plaque but no stenosis greater than 30%. No evidence of left vertebral dissection or occlusion. Right vertebral artery is the larger vessel. No origin stenosis. The vessel is tortuous proximally and shows some areas of atherosclerosis but no stenosis greater than 30% suspected. In the lower cervical region, the vessel is quite tortuous but likely sufficiently patent. Skeleton: Chronic spinal curvature, degenerative spondylosis and facet osteoarthritis. Other neck: No mass or lymphadenopathy. Upper chest: Bilateral pleural and parenchymal scarring. Asymmetric density in the right upper lobe laterally measuring up to 1.4 cm should probably be evaluated by complete chest CT. There is air within the left chest wall, etiology uncertain. I do not see a pneumothorax on the left. I do not see fracture  of the upper left ribs. Review of the MIP images confirms the above findings CTA HEAD FINDINGS Anterior circulation: Both internal carotid arteries are patent through the skull base and siphon regions. There is atherosclerotic calcification throughout both carotid siphon regions with stenosis estimated at 50% on each side. The anterior and middle cerebral vessels are patent without proximal stenosis, aneurysm or vascular malformation. Posterior circulation: Both vertebral arteries are patent at the foramen magnum. There is mild atherosclerotic change in both V4 segments but no stenosis greater than 20%. Right posterior inferior cerebellar artery appears to show flow. Left PICA is probably occluded. Both vertebral arteries reach the basilar. In the distal basilar, there appears to be a small filling defect anteriorly consistent with an embolus. Flow is present in both superior cerebellar arteries and both posterior cerebral arteries  presently. Venous sinuses: Patent and normal. Anatomic variants: None significant Delayed phase: Not performed IMPRESSION: Aortic atherosclerosis. Atherosclerosis of the brachiocephalic vessel origins without flow limiting stenosis. Atherosclerotic disease at both carotid bifurcations. 30% stenosis on the right. No stenosis on the left. Atherosclerotic disease in both carotid siphon regions with stenosis estimated at 50% on each side. Intracranial anterior circulation branch vessels are patent without correctable proximal stenosis. No occluded large or medium vessels are seen in the anterior circulation. Atherosclerotic disease at both vertebral artery origins and the proximal 2 cm. 50% stenosis of the left vertebral origin. Both proximal vertebral arteries show tortuosity and slight irregularity but without flow limiting stenosis. Stenosis in those regions may approach 30%. Both vertebral arteries are patent to the basilar. Minimal atherosclerosis in both V4 segments but without stenosis  greater than 25%. Small filling defect in the distal basilar artery along the anterior margin probably representing a small embolus or embolus remnant. Flow is present currently within both posterior cerebral arteries, both superior cerebellar arteries, both anterior inferior cerebellar arteries and at least the right posterior inferior cerebellar artery. There may be diminished or absent flow in the left PICA. Air/gas in the left chest wall, etiology unknown. I do not see a left pneumothorax or upper chest fracture. Was there a left subclavian central line attempt? Pleural and parenchymal scarring at both lung apices. Asymmetric density at the right upper lobe measuring up to 1.4 cm in diameter. Mass lesion not excluded. Complete chest CT would be suggested at some point. Electronically Signed   By: Nelson Chimes M.D.   On: 05/16/2019 15:15   Mr Brain Wo Contrast  Result Date: 05/16/2019 CLINICAL DATA:  Stroke.  Recent mastectomy 05/09/2019 EXAM: MRI HEAD WITHOUT CONTRAST MRA HEAD WITHOUT CONTRAST TECHNIQUE: Multiplanar, multiecho pulse sequences of the brain and surrounding structures were obtained without intravenous contrast. Angiographic images of the head were obtained using MRA technique without contrast. COMPARISON:  CT head 05/15/2019 FINDINGS: MRI HEAD FINDINGS Brain: Multiple areas of acute infarction in the posterior circulation. Relatively large superior cerebellar infarct on the left. Moderate right superior cerebellar infarct. Acute infarcts in the occipital lobe bilaterally left greater than right. Small acute infarcts in the left thalamus. Small area of acute infarct in the left tectum. Generalized right atrophy frontal lobe. Chronic microvascular ischemic change in the white matter bilaterally. Negative for hemorrhage or mass. No hydrocephalus identified. Vascular: Normal arterial flow voids. In particular, normal flow voids in both vertebral arteries and the basilar artery and both carotid  arteries. Skull and upper cervical spine: Negative Sinuses/Orbits: Paranasal sinuses clear. Bilateral cataract surgery. Other: None MRA HEAD FINDINGS Both vertebral arteries widely patent. Basilar widely patent. PICA not visualized. Small left AICA patent. Superior cerebellar arteries not visualized. Both posterior cerebral arteries are patent. Moderate stenosis left P1 segment. Mild stenosis distal posterior cerebral artery bilaterally. Cavernous carotid artery patent bilaterally with atherosclerotic irregularity. Moderate stenosis of the cavernous carotid on the left and mild stenosis supraclinoid internal carotid artery on the right. Both anterior cerebral arteries patent. M1 segments patent bilaterally. Moderate to severe disease in the M2 branches bilaterally. Negative for aneurysm. IMPRESSION: 1. Multiple large areas of acute infarction in the posterior circulation involving the cerebellum bilaterally, occipital lobes bilaterally, left thalamus, and left tectum. No hemorrhage. Findings may be due to posterior circulation emboli. Suggest CTA head and neck for further evaluation of the arterial circulation. 2. Diffuse intracranial atherosclerotic disease. Electronically Signed   By: Franchot Gallo M.D.  On: 05/16/2019 08:41   Dg Chest Port 1 View  Result Date: 05/15/2019 CLINICAL DATA:  Weakness EXAM: PORTABLE CHEST 1 VIEW COMPARISON:  09/16/2015 FINDINGS: Heart is normal size. Mild peribronchial thickening and interstitial prominence, likely mild bronchitic changes. No effusions or acute bony abnormality. IMPRESSION: Peribronchial thickening and interstitial prominence suggestive of bronchitic changes. Electronically Signed   By: Rolm Baptise M.D.   On: 05/15/2019 09:57   Mr Jodene Nam Head/brain OZ Cm  Result Date: 05/16/2019 CLINICAL DATA:  Stroke.  Recent mastectomy 05/09/2019 EXAM: MRI HEAD WITHOUT CONTRAST MRA HEAD WITHOUT CONTRAST TECHNIQUE: Multiplanar, multiecho pulse sequences of the brain and  surrounding structures were obtained without intravenous contrast. Angiographic images of the head were obtained using MRA technique without contrast. COMPARISON:  CT head 05/15/2019 FINDINGS: MRI HEAD FINDINGS Brain: Multiple areas of acute infarction in the posterior circulation. Relatively large superior cerebellar infarct on the left. Moderate right superior cerebellar infarct. Acute infarcts in the occipital lobe bilaterally left greater than right. Small acute infarcts in the left thalamus. Small area of acute infarct in the left tectum. Generalized right atrophy frontal lobe. Chronic microvascular ischemic change in the white matter bilaterally. Negative for hemorrhage or mass. No hydrocephalus identified. Vascular: Normal arterial flow voids. In particular, normal flow voids in both vertebral arteries and the basilar artery and both carotid arteries. Skull and upper cervical spine: Negative Sinuses/Orbits: Paranasal sinuses clear. Bilateral cataract surgery. Other: None MRA HEAD FINDINGS Both vertebral arteries widely patent. Basilar widely patent. PICA not visualized. Small left AICA patent. Superior cerebellar arteries not visualized. Both posterior cerebral arteries are patent. Moderate stenosis left P1 segment. Mild stenosis distal posterior cerebral artery bilaterally. Cavernous carotid artery patent bilaterally with atherosclerotic irregularity. Moderate stenosis of the cavernous carotid on the left and mild stenosis supraclinoid internal carotid artery on the right. Both anterior cerebral arteries patent. M1 segments patent bilaterally. Moderate to severe disease in the M2 branches bilaterally. Negative for aneurysm. IMPRESSION: 1. Multiple large areas of acute infarction in the posterior circulation involving the cerebellum bilaterally, occipital lobes bilaterally, left thalamus, and left tectum. No hemorrhage. Findings may be due to posterior circulation emboli. Suggest CTA head and neck for further  evaluation of the arterial circulation. 2. Diffuse intracranial atherosclerotic disease. Electronically Signed   By: Franchot Gallo M.D.   On: 05/16/2019 08:41        Scheduled Meds:   stroke: mapping our early stages of recovery book   Does not apply Once   aspirin EC  81 mg Oral Daily   cholecalciferol  1,000 Units Oral Daily   enoxaparin (LOVENOX) injection  40 mg Subcutaneous Q24H   letrozole  2.5 mg Oral Daily   Continuous Infusions:  sodium chloride 75 mL/hr at 05/15/19 1701   sodium chloride 75 mL/hr at 05/16/19 2005     LOS: 2 days    Time spent: 25 minutes    Barb Merino, MD [facility] Pager [phone]  If 7PM-7AM, please contact night-coverage [email or web address] Password [facility] 05/17/2019, 9:48 AM

## 2019-05-17 NOTE — H&P (View-Only) (Signed)
STROKE TEAM PROGRESS NOTE   INTERVAL HISTORY Patient continues to have vision difficulties but states that she is able to see confabulates.  Dr.Ghimire spoke to the patient's daughter states she was quite independent prior to the strokes cancer was treated and she has a good life expectancy has per Dr. Lindi Adie her oncologist.  Would like to be aggressive with her stroke evaluation and treatment Vitals:   05/16/19 2357 05/17/19 0346 05/17/19 0800 05/17/19 1150  BP: 111/71 103/64 134/86 (!) 144/68  Pulse: 69 66 73 69  Resp: 18 18 16 16   Temp: 97.8 F (36.6 C) 97.7 F (36.5 C) 99 F (37.2 C) 99.1 F (37.3 C)  TempSrc: Oral Axillary Oral Oral  SpO2: 98% 98% 98% 99%  Weight:      Height:        CBC:  Recent Labs  Lab 05/15/19 0910  WBC 12.8*  NEUTROABS 11.5*  HGB 14.7  HCT 43.6  MCV 94.0  PLT 923    Basic Metabolic Panel:  Recent Labs  Lab 05/15/19 0910  NA 133*  K 3.9  CL 95*  CO2 20*  GLUCOSE 159*  BUN 16  CREATININE 0.69  CALCIUM 9.2   Lipid Panel:     Component Value Date/Time   CHOL 215 (H) 05/17/2019 0419   TRIG 148 05/17/2019 0419   HDL 46 05/17/2019 0419   CHOLHDL 4.7 05/17/2019 0419   VLDL 30 05/17/2019 0419   LDLCALC 139 (H) 05/17/2019 0419   HgbA1c:  Lab Results  Component Value Date   HGBA1C 5.6 05/17/2019   Urine Drug Screen: No results found for: LABOPIA, COCAINSCRNUR, LABBENZ, AMPHETMU, THCU, LABBARB  Alcohol Level No results found for: [place] elderly lady not in distress. . Afebrile. Head is nontraumatic. Neck is supple without bruit.    Cardiac exam no murmur or gallop. Lungs are clear to auscultation. Distal pulses are well felt. Neurological Exam ;  Awake  Alert oriented x 2. Diminished attention, registration and recall.. Normal speech and language.eye movements full without nystagmus.  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   Hearing is normal. Palatal movements are normal. Face symmetric. Tongue midline. Normal strength, tone, reflexes and coordination. Normal sensation. Gait deferred.  ASSESSMENT/PLAN Ms. Reggie Welge is a 78 y.o. female with history of breast cancer s/p mastectomy who was discharged on Wednesday, did well for 2 days then became weak and lethargic on Saturday, developing confusion and decreased p.o. intake. Presenting to [facility] ED with CT showing bilateral cerebellar and left occipital lobe hypoattenuation.   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 No source of embolus. Negative bubble  LDL 139 mg percent  HgbA1c 5.6  Lovenox 40 mg sq daily for VTE prophylaxis  aspirin 81 mg daily prior to admission, now on aspirin 81 mg daily. Recommend aspirin 81 mg and plavix 75 mg  daily x 3 weeks, then PLAVIX alone. Orders adjusted.   Therapy recommendations: CIR/SNF  Disposition:  pending   Hypertension  Stable . Permissive hypertension (OK if < 220/120) but gradually normalize in 5-7 days . Long-term BP goal normotensive  Dysphagia  Secondary to stroke  Soft Nectar thick liquids  SLP on board   Other Stroke Risk Factors  Advanced age  Cigarette smoker, advised to stop smoking  ETOH use, advised to drink no more than 1  drink(s) a day  Other Active Problems  Breast cancer status post left mastectomy 05/09/2019  Hospital day # 2     She presented with confusion and weakness and exam shows cortical blindness bilateral. and CT scan shows bilateral cerebellar infarcts and left occipital infarct etiology likely hypercoagulability related to cancer versus marantic endocarditis or embolism from paroxysmal A. fib.  Recommend aspirin for now.  Check TEE for marantic endocarditis and loop recorder for paroxysmal A. fib.  Discussed with Dr. Dante Gang.  Greater than 50% time during this 25-minute visit was spent on counseling and coordination of care about the strokes and answering questions  Antony Contras, [place] Pager: [phone] 05/17/2019 3:09 PM   To contact Stroke Continuity provider, please refer to [email or web address] After hours, contact General Neurology

## 2019-05-17 NOTE — Progress Notes (Signed)
Lower extremity venous has been completed.   Preliminary results in CV Proc.   Abram Sander 05/17/2019 10:01 AM

## 2019-05-17 NOTE — Progress Notes (Signed)
  Speech Language Pathology Treatment: Dysphagia  Patient Details Name: Jessica Harrell MRN: [id number] DOB: 09/28/1941 Today's Date: 05/17/2019 Time: 5329-9242 SLP Time Calculation (min) (ACUTE ONLY): 20 min  Assessment / Plan / Recommendation Clinical Impression  Pt today continues to demonstrate clinical signs concerning for pharyngeal dysphagia- c/b delayed multiple swallows and non productive cough after thin liquid intake. She also demonstrates anterior loss of liquid on her right due to facial nerve deficit.  Pt yesterday had stated premorbid occasional difficulty with liquids causing her to cough and thus given her stroke, increased RR with effort and concern for dysphagia- MBS indicated.  MD Ghimire and pt agreeable to plan.  RN informed and requested by this SLP to saline lock pt to allow ease of transport.    Of note, pt today states she is in "[place]" when asked and she is marginally more dysarthric but is waking at this time.  SlP reoriented her with recall within few minutes.  She also demonstrated retention of items that SLP is gathering for her.    HPI HPI: Jessica Harrell is an 78 y.o. female with breast CA status post mastectomy last week who was discharged home on Wednesday, did well for 2 days, then became very weak and lethargic on Saturday with confusion and decreased po intake- diagnosed with encephalopathy. She presented to the [facility] ED, where a CT head revealed bilateral cerebellar and left occipital lobe hypoattenuation. She was outside of the window for TPA.  Pt has h/o smoking and reports she is a current smoker.  Speech and swallow evaluation ordered.        SLP Plan   mbs today       Recommendations  Diet recommendations: Dysphagia 3 (mechanical soft);Nectar-thick liquid Liquids provided via: Cup;Straw Medication Administration: Whole meds with puree Supervision: Staff to assist with self feeding Compensations: Slow rate;Small sips/bites Postural  Changes and/or Swallow Maneuvers: Seated upright 90 degrees;Upright 30-60 min after meal                Follow up Recommendations: Skilled Nursing facility SLP Visit Diagnosis: Cognitive communication deficit (R41.841);Attention and concentration deficit Attention and concentration deficit following: Cerebral infarction       GO                [name] 05/17/2019, 8:31 AM  Luanna Salk, MS [facility] SLP Acute Rehab Services Pager [phone] Office [phone]

## 2019-05-17 NOTE — Progress Notes (Signed)
Modified Barium Swallow Progress Note  Patient Details  Name: Jessica Harrell MRN: [id number] Date of Birth: 12-08-1940  Today's Date: 05/17/2019  Modified Barium Swallow completed.  Full report located under Chart Review in the Imaging Section.  Brief recommendations include the following:  Clinical Impression  Patient presents with discoordination with swallowing due to her bilateral cerebellar strokes.  This results in decreased oral control, premature spillage of boluses into pharynx/larynx and decreased timing of laryngeal closure. GROSS aspiration of thin via cup noted also due pt having difficulty holding her cup - thus taking very large boluses.  Overt cough noted with gross aspiration but this did not clear aspirates.  Laryngeal penetration to vocal cords present with tsp of thin - TRACE.  Chin tuck posture using straw continued to allow laryngeal penetration of thin to cords.  No aspiration/penetration of nectar, pudding, moist solid or pudding with tablet.  Discoordination/weakness also allows oropharyngeal residuals across consistencies. Cued dry swallow helpful to decrease residue but difficult for pt to perform.  Following solids with nectar helpful.  Please note, upon esophageal sweep after swallowing barium tablet taken with pudding pt appears with residuals throughout with retrograde propulsion without awareness.  Esophagus appears widened - multiple swallows of nectar effective to largely clear esophagus. Suspect some component of dysmotility - radiologist not present to confirm. Recommend strict aspiration precautions for [name].  Will follow up for dysphagia management.     Swallow Evaluation Recommendations       SLP Diet Recommendations: Dysphagia 3 (Mech soft) solids;Nectar thick liquid(tsps thin water between meals, single ice chip ok)       Medication Administration: Whole meds with puree   Supervision: Staff to assist with self feeding   Compensations: Slow  rate;Small sips/bites;Follow solids with liquid(start intake with liquids, intermittent dry swallow, several sips of nectar after bites of food)   Postural Changes: Seated upright at 90 degrees;Remain semi-upright after after feeds/meals (Comment)   Oral Care Recommendations: Oral care before and after PO      Luanna Salk, MS [facility] SLP Acute Rehab Services Pager [phone] Office [phone]   [name] 05/17/2019,10:22 AM

## 2019-05-17 NOTE — Progress Notes (Signed)
    [facility] has been requested to perform a transesophageal echocardiogram on 06/11 for CVA.    Pt not able to sign the consent. Husband contacted and requested permission be given by the daughter. Ms [place] contacted and agreed to the procedure w/ charge RN witness.   After careful review of history and examination, the risks and benefits of transesophageal echocardiogram have been explained including risks of esophageal damage, perforation (1:10,000 risk), bleeding, pharyngeal hematoma as well as other potential complications associated with conscious sedation including aspiration, arrhythmia, respiratory failure and death. Alternatives to treatment were discussed, questions were answered. Patient's family is willing to proceed.   Rosaria Ferries, PA-C 05/17/2019 3:04 PM

## 2019-05-17 NOTE — Progress Notes (Signed)
STROKE TEAM PROGRESS NOTE   INTERVAL HISTORY Patient continues to have vision difficulties but states that she is able to see confabulates.  Dr.Ghimire spoke to the patient's daughter states she was quite independent prior to the strokes cancer was treated and she has a good life expectancy has per Dr. Lindi Adie her oncologist.  Would like to be aggressive with her stroke evaluation and treatment Vitals:   05/16/19 2357 05/17/19 0346 05/17/19 0800 05/17/19 1150  BP: 111/71 103/64 134/86 (!) 144/68  Pulse: 69 66 73 69  Resp: 18 18 16 16   Temp: 97.8 F (36.6 C) 97.7 F (36.5 C) 99 F (37.2 C) 99.1 F (37.3 C)  TempSrc: Oral Axillary Oral Oral  SpO2: 98% 98% 98% 99%  Weight:      Height:        CBC:  Recent Labs  Lab 05/15/19 0910  WBC 12.8*  NEUTROABS 11.5*  HGB 14.7  HCT 43.6  MCV 94.0  PLT 939    Basic Metabolic Panel:  Recent Labs  Lab 05/15/19 0910  NA 133*  K 3.9  CL 95*  CO2 20*  GLUCOSE 159*  BUN 16  CREATININE 0.69  CALCIUM 9.2   Lipid Panel:     Component Value Date/Time   CHOL 215 (H) 05/17/2019 0419   TRIG 148 05/17/2019 0419   HDL 46 05/17/2019 0419   CHOLHDL 4.7 05/17/2019 0419   VLDL 30 05/17/2019 0419   LDLCALC 139 (H) 05/17/2019 0419   HgbA1c:  Lab Results  Component Value Date   HGBA1C 5.6 05/17/2019   Urine Drug Screen: No results found for: LABOPIA, COCAINSCRNUR, LABBENZ, AMPHETMU, THCU, LABBARB  Alcohol Level No results found for: [place] elderly lady not in distress. . Afebrile. Head is nontraumatic. Neck is supple without bruit.    Cardiac exam no murmur or gallop. Lungs are clear to auscultation. Distal pulses are well felt. Neurological Exam ;  Awake  Alert oriented x 2. Diminished attention, registration and recall.. Normal speech and language.eye movements full without nystagmus.  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   Hearing is normal. Palatal movements are normal. Face symmetric. Tongue midline. Normal strength, tone, reflexes and coordination. Normal sensation. Gait deferred.  ASSESSMENT/PLAN [name] is a 78 y.o. female with history of breast cancer s/p mastectomy who was discharged on Wednesday, did well for 2 days then became weak and lethargic on Saturday, developing confusion and decreased p.o. intake. Presenting to [facility] ED with CT showing bilateral cerebellar and left occipital lobe hypoattenuation.   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 No source of embolus. Negative bubble  LDL 139 mg percent  HgbA1c 5.6  Lovenox 40 mg sq daily for VTE prophylaxis  aspirin 81 mg daily prior to admission, now on aspirin 81 mg daily. Recommend aspirin 81 mg and plavix 75 mg  daily x 3 weeks, then PLAVIX alone. Orders adjusted.   Therapy recommendations: CIR/SNF  Disposition:  pending   Hypertension  Stable . Permissive hypertension (OK if < 220/120) but gradually normalize in 5-7 days . Long-term BP goal normotensive  Dysphagia  Secondary to stroke  Soft Nectar thick liquids  SLP on board   Other Stroke Risk Factors  Advanced age  Cigarette smoker, advised to stop smoking  ETOH use, advised to drink no more than 1  drink(s) a day  Other Active Problems  Breast cancer status post left mastectomy 05/09/2019  Hospital day # 2     She presented with confusion and weakness and exam shows cortical blindness bilateral. and CT scan shows bilateral cerebellar infarcts and left occipital infarct etiology likely hypercoagulability related to cancer versus marantic endocarditis or embolism from paroxysmal A. fib.  Recommend aspirin for now.  Check TEE for marantic endocarditis and loop recorder for paroxysmal A. fib.  Discussed with Dr. Dante Gang.  Greater than 50% time during this 25-minute visit was spent on counseling and coordination of care about the strokes and answering questions  Antony Contras, [place] Pager: [phone] 05/17/2019 3:09 PM   To contact Stroke Continuity provider, please refer to [email or web address] After hours, contact General Neurology

## 2019-05-17 NOTE — Progress Notes (Signed)
  Speech Language Pathology Treatment: Dysphagia;Cognitive-Linquistic  Patient Details Name: Jessica Harrell MRN: [id number] DOB: March 08, 1941 Today's Date: 05/17/2019 Time: 9030-0923 SLP Time Calculation (min) (ACUTE ONLY): 10 min  Assessment / Plan / Recommendation Clinical Impression  Pt seen to provide education re: MBS and compensate for her cognitive linguistic deficits.  SLP reviewed results of MBS including the overt aspiration causing pt to significant cough with obvious discomfort.  Pt did NOT recall this happening a few hours prior but after description of it "going the wrong way" she as able to recall with moderate verbal cues. SlP also educated pt to concern for ramifications of aspiration given her stroke and decreased ambulation.  Compensation strategies to improve speech intelligibility reviewed as pt is markedly more dysarthric today than yesterday. She reports he is 'tired" and this certainly may play a role in her dysarthria.  Pt recalled to speak "slowly" and demonstrated this with min cues and she will require encouragement to use conduct.  Reviewed call bell use with pt verbally after which she pushed the call bell (tape placed on it to help her locate nurse button) independently! Posted sign encouraging staff to have pt use call bell for assist.  Will continue to follow pt for dysphagia, dysarthria dn cognitive-linguistic skills to decrease caregiver burden.  Pt agreeable to plan.    HPI HPI: Jessica Harrell is an 78 y.o. female with breast CA status post mastectomy last week who was discharged home on Wednesday, did well for 2 days, then became very weak and lethargic on Saturday with confusion and decreased po intake- diagnosed with encephalopathy. She presented to the [facility] ED, where a CT head revealed bilateral cerebellar and left occipital lobe hypoattenuation. She was outside of the window for TPA.  Pt has h/o smoking and reports she is a current smoker.  Speech and  swallow evaluation ordered.        SLP Plan  Continue with current plan of care       Recommendations  Diet recommendations: Dysphagia 3 (mechanical soft);Nectar-thick liquid Liquids provided via: Cup;Straw Medication Administration: Whole meds with puree Supervision: Staff to assist with self feeding Compensations: Slow rate;Small sips/bites(multiple liquid swallow after every bite of food) Postural Changes and/or Swallow Maneuvers: Seated upright 90 degrees;Upright 30-60 min after meal                Oral Care Recommendations: Oral care BID Follow up Recommendations: Skilled Nursing facility SLP Visit Diagnosis: Cognitive communication deficit (R41.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

## 2019-05-17 NOTE — [place] FL2 (Signed)
[place] MEDICAID FL2 LEVEL OF CARE SCREENING TOOL     IDENTIFICATION  Patient Name: Jessica Harrell Birthdate: [birth year] Sex: female Admission Date (Current Location): 05/15/2019  [facility] and [place] Number:  Herbalist and Address:  [facility], [place] [address], [place], [place] [address]      Provider Number: [id number]  Attending Physician Name and Address:  Barb Merino, MD  Relative Name and Phone Number:       Current Level of Care: Hospital Recommended Level of Care: [place] Prior Approval Number:    Date Approved/Denied:   PASRR Number: [id number]  Discharge Plan: SNF    Current Diagnoses: Patient Active Problem List   Diagnosis Date Noted  . Acute ischemic stroke ([place]) 05/15/2019  . Acute metabolic encephalopathy 73/22/0254  . Encephalopathy   . History of left breast cancer 05/09/2019  . Malignant neoplasm of lower-inner quadrant of left breast in female, estrogen receptor positive ([place]) 04/26/2018    Orientation RESPIRATION BLADDER Height & Weight     Self, Situation, Place  Normal Incontinent Weight: 143 lb 1.3 oz (64.9 kg) Height:  5\' 7"  (170.2 cm)  BEHAVIORAL SYMPTOMS/MOOD NEUROLOGICAL BOWEL NUTRITION STATUS      Continent Diet(see DC summary)  AMBULATORY STATUS COMMUNICATION OF NEEDS Skin   Extensive Assist Verbally Surgical wounds(closed left breast, foam dressing; check under dressing to assess site every shift)                       Personal Care Assistance Level of Assistance  Bathing, Feeding, Dressing Bathing Assistance: Maximum assistance Feeding assistance: Maximum assistance Dressing Assistance: Maximum assistance     Functional Limitations Info  Sight, Hearing, Speech Sight Info: Impaired(cortical blindness) Hearing Info: Adequate Speech Info: Impaired(dysarthria)    SPECIAL CARE FACTORS FREQUENCY  PT (By licensed PT), OT (By licensed OT), Speech therapy     PT  Frequency: 5x/wk OT Frequency: 5x/wk     Speech Therapy Frequency: 5x/wk      Contractures Contractures Info: Not present    Additional Factors Info  Code Status, Allergies Code Status Info: Full Allergies Info: Atorvastatin           Current Medications (05/17/2019):  This is the current hospital active medication list Current Facility-Administered Medications  Medication Dose Route Frequency Provider Last Rate Last Dose  .  stroke: mapping our early stages of recovery book   Does not apply Once Tat, David, MD      . 0.9 %  sodium chloride infusion   Intravenous Continuous Tat, David, MD 75 mL/hr at 05/17/19 1150    . acetaminophen (TYLENOL) tablet 650 mg  650 mg Oral Q4H PRN Tat, David, MD       Or  . acetaminophen (TYLENOL) solution 650 mg  650 mg Per Tube Q4H PRN Tat, Shanon Brow, MD       Or  . acetaminophen (TYLENOL) suppository 650 mg  650 mg Rectal Q4H PRN Tat, Shanon Brow, MD      . aspirin EC tablet 81 mg  81 mg Oral Daily Tat, David, MD   81 mg at 05/17/19 1133  . cholecalciferol (VITAMIN D3) tablet 1,000 Units  1,000 Units Oral Daily Tat, David, MD   1,000 Units at 05/17/19 1133  . enoxaparin (LOVENOX) injection 40 mg  40 mg Subcutaneous Q24H Tat, Shanon Brow, MD   40 mg at 05/16/19 1754  . letrozole [facility]) tablet 2.5 mg  2.5 mg Oral Daily  Orson Eva, MD   Stopped at 05/17/19 1150  . Resource ThickenUp Clear   Oral PRN Barb Merino, MD      . senna-docusate (Senokot-S) tablet 1 tablet  1 tablet Oral QHS PRN Tat, David, MD      . sodium chloride flush (NS) 0.9 % injection 10-40 mL  10-40 mL Intracatheter PRN Barb Merino, MD         Discharge Medications: Please see discharge summary for a list of discharge medications.  Relevant Imaging Results:  Relevant Lab Results:   Additional Information SS#: [id number]  Geralynn Ochs, LCSW

## 2019-05-17 NOTE — Progress Notes (Signed)
Physical Therapy Treatment Patient Details Name: Jessica Harrell MRN: [id number] DOB: Mar 07, 1941 Today's Date: 05/17/2019    History of Present Illness Patient is a 78 year old female admitted with acute CVA of bilateral cerebellum, L thalamus and left tectum. She is S/P bilateral mastectomy on 05/09/2019. She had gone home for 2 days before coming back to the hospital with stroke like symptoms. PMH: breast cancer, right foot pain;     PT Comments    Patient able to transfer to a chair today with therapist assist. She was unable to transfer with walker. 2nd to visual deficits she consistently tried to turn in the wrong direction despite cuing. She required mod a for safety and balance fro transfer. She would benefit from continued acute therapy as well as further rehab at a SNF.   Follow Up Recommendations  SNF     Equipment Recommendations  Rolling walker with 5" wheels    Recommendations for Other Services Rehab consult     Precautions / Restrictions Precautions Precautions: Fall Restrictions Weight Bearing Restrictions: No    Mobility  Bed Mobility Overal bed mobility: Needs Assistance Bed Mobility: Supine to Sit;Sit to Supine     Supine to sit: Min guard Sit to supine: Min guard   General bed mobility comments: continues to require gaurding for safety and inital sitting balance   Transfers Overall transfer level: Needs assistance Equipment used: Rolling walker (2 wheeled) Transfers: Sit to/from Stand Sit to Stand: Mod assist         General transfer comment: Mod a to stand. Mod a tfor balance to remain standing. Therapy tried to have patient transfer with walker but she consitently turned in the wrong direction 2nd to visual deficits. Despite max verbal and tactile cuing she was unable to turn in the right direction    Ambulation/Gait Ambulation/Gait assistance: Mod assist Gait Distance (Feet): 3 Feet Assistive device: 1 person hand held assist Gait  Pattern/deviations: Ataxic Gait velocity: decreased   General Gait Details: Patient continuess to be ataxic with gait. She was unable to take steps using the walker. With hand held moderate assist for strength and balance she was able to take steps to the chair.    Stairs             Wheelchair Mobility    Modified Rankin (Stroke Patients Only) Modified Rankin (Stroke Patients Only) Pre-Morbid Rankin Score: Moderate disability Modified Rankin: Severe disability     Balance Overall balance assessment: Needs assistance Sitting-balance support: Bilateral upper extremity supported;Feet supported Sitting balance-Leahy Scale: Fair Sitting balance - Comments: continues to require gaurding for sitting    Standing balance support: Bilateral upper extremity supported Standing balance-Leahy Scale: Poor Standing balance comment: max a to remain balanced                             Cognition Arousal/Alertness: Lethargic Behavior During Therapy: Impulsive;Flat affect Overall Cognitive Status: Impaired/Different from baseline Area of Impairment: Orientation;Memory;Attention;Following commands;Safety/judgement;Awareness;Problem solving                 Orientation Level: Place;Time;Situation Current Attention Level: Focused Memory: Decreased short-term memory Following Commands: Follows one step commands inconsistently Safety/Judgement: Decreased awareness of safety;Decreased awareness of deficits Awareness: Intellectual Problem Solving: Slow processing;Decreased initiation;Difficulty sequencing;Requires verbal cues;Requires tactile cues General Comments: Patient is very pl;easent but confused       Exercises      General Comments  Pertinent Vitals/Pain Pain Assessment: No/denies pain    Home Living                      Prior Function            PT Goals (current goals can now be found in the care plan section) Acute Rehab PT  Goals Patient Stated Goal: unable to state  PT Goal Formulation: With patient Progress towards PT goals: Progressing toward goals    Frequency    Min 4X/week      PT Plan Current plan remains appropriate    Co-evaluation              AM-PAC PT "6 Clicks" Mobility   Outcome Measure  Help needed turning from your back to your side while in a flat bed without using bedrails?: A Little Help needed moving from lying on your back to sitting on the side of a flat bed without using bedrails?: A Little Help needed moving to and from a bed to a chair (including a wheelchair)?: A Lot Help needed standing up from a chair using your arms (e.g., wheelchair or bedside chair)?: A Lot Help needed to walk in hospital room?: Total Help needed climbing 3-5 steps with a railing? : Total 6 Click Score: 12    End of Session Equipment Utilized During Treatment: Gait belt Activity Tolerance: Patient limited by fatigue Patient left: in bed;with call bell/phone within reach;with chair alarm set Nurse Communication: Mobility status PT Visit Diagnosis: Other abnormalities of gait and mobility (R26.89);Muscle weakness (generalized) (M62.81);Difficulty in walking, not elsewhere classified (R26.2)     Time: 6389-3734 PT Time Calculation (min) (ACUTE ONLY): 18 min  Charges:  $Therapeutic Activity: 8-22 mins                        Carney Living PT DPT  05/17/2019, 2:53 PM

## 2019-05-18 ENCOUNTER — Encounter (HOSPITAL_COMMUNITY)
Admission: EM | Disposition: A | Discharge status: Skilled Nursing Facility | Payer: Self-pay | Source: Home / Self Care | Attending: Internal Medicine

## 2019-05-18 ENCOUNTER — Encounter (HOSPITAL_COMMUNITY): Payer: Self-pay

## 2019-05-18 ENCOUNTER — Inpatient Hospital Stay (HOSPITAL_COMMUNITY): Payer: Medicare Other

## 2019-05-18 DIAGNOSIS — I639 Cerebral infarction, unspecified: Secondary | ICD-10-CM

## 2019-05-18 HISTORY — PX: BUBBLE STUDY: SHX6837

## 2019-05-18 HISTORY — PX: TEE WITHOUT CARDIOVERSION: SHX5443

## 2019-05-18 SURGERY — ECHOCARDIOGRAM, TRANSESOPHAGEAL
Anesthesia: Moderate Sedation

## 2019-05-18 MED ORDER — FENTANYL CITRATE (PF) 100 MCG/2ML IJ SOLN
INTRAMUSCULAR | Status: AC
  Filled 2019-05-18: qty 2

## 2019-05-18 MED ORDER — MIDAZOLAM HCL (PF) 10 MG/2ML IJ SOLN
INTRAMUSCULAR | Status: DC | PRN
  Administered 2019-05-18 (×2): 1 mg via INTRAVENOUS
  Administered 2019-05-18: 2 mg via INTRAVENOUS

## 2019-05-18 MED ORDER — MIDAZOLAM HCL (PF) 5 MG/ML IJ SOLN
INTRAMUSCULAR | Status: AC
  Filled 2019-05-18: qty 2

## 2019-05-18 MED ORDER — BUTAMBEN-TETRACAINE-BENZOCAINE 2-2-14 % EX AERO
INHALATION_SPRAY | CUTANEOUS | Status: DC | PRN
  Administered 2019-05-18: 2 via TOPICAL

## 2019-05-18 MED ORDER — SODIUM CHLORIDE 0.9 % IV SOLN
INTRAVENOUS | Status: DC
  Administered 2019-05-18: 09:00:00 via INTRAVENOUS

## 2019-05-18 MED ORDER — FENTANYL CITRATE (PF) 100 MCG/2ML IJ SOLN
INTRAMUSCULAR | Status: DC | PRN
  Administered 2019-05-18 (×2): 25 ug via INTRAVENOUS

## 2019-05-18 NOTE — Progress Notes (Signed)
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  Lower extremity venous Dopplers done yesterday was negative for DVT.  Patient continues to have no vision confabulates.  Neurologically no changes. Vitals:   05/18/19 0925 05/18/19 0931 05/18/19 0940 05/18/19 1140  BP: 123/78 (!) 123/56 104/69 124/60  Pulse: 70 62 (!) 59 67  Resp: 17 15 14 14   Temp:  (!) 97.5 F (36.4 C)  97.6 F (36.4 C)  TempSrc:  Oral  Oral  SpO2: 98% 96% 95% 96%  Weight:      Height:        CBC:  Recent Labs  Lab 05/15/19 0910  WBC 12.8*  NEUTROABS 11.5*  HGB 14.7  HCT 43.6  MCV 94.0  PLT 536    Basic Metabolic Panel:  Recent Labs  Lab 05/15/19 0910  NA 133*  K 3.9  CL 95*  CO2 20*  GLUCOSE 159*  BUN 16  CREATININE 0.69  CALCIUM 9.2   Lipid Panel:     Component Value Date/Time   CHOL 215 (H) 05/17/2019 0419   TRIG 148 05/17/2019 0419   HDL 46 05/17/2019 0419   CHOLHDL 4.7 05/17/2019 0419   VLDL 30 05/17/2019 0419   LDLCALC 139 (H) 05/17/2019 0419   HgbA1c:  Lab Results  Component Value Date   HGBA1C 5.6 05/17/2019   Urine Drug Screen: No results found for: LABOPIA, COCAINSCRNUR, LABBENZ, AMPHETMU, THCU, LABBARB  Alcohol Level No results found for: ETH TEE positive for PFO and bidirectional shunt at rest.  No vegetations or clots Lower extremity venous Doppler negative for DVT PHYSICAL EXAM Pleasant elderly lady not in distress. . Afebrile. Head is nontraumatic. Neck is supple without bruit.    Cardiac exam no murmur or gallop. Lungs are clear to auscultation. Distal pulses are well felt. Neurological Exam ;  Awake  Alert oriented x 2. Diminished attention, registration and recall.. Normal speech and language.eye movements full without nystagmus.  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   Hearing is normal. Palatal movements are normal. Face symmetric. Tongue midline. Normal strength, tone, reflexes and coordination. Normal sensation. Gait deferred.  ASSESSMENT/PLAN [name] is a 78 y.o. female with history of breast cancer s/p mastectomy who was discharged on Wednesday, did well for 2 days then became weak and lethargic on Saturday, developing confusion and decreased p.o. intake. Presenting to [facility] ED with CT showing bilateral cerebellar and left occipital lobe hypoattenuation.   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 No source of embolus. Negative bubble  LDL 139 mg percent  HgbA1c 5.6  Lovenox 40 mg sq daily for VTE prophylaxis  aspirin 81 mg daily prior to admission, now on aspirin 81 mg daily. Recommend aspirin 81 mg and plavix 75 mg daily x  3 weeks, then PLAVIX alone. Orders adjusted.   Therapy recommendations: CIR/SNF  Disposition:  pending   Hypertension  Stable . Permissive hypertension (OK if < 220/120) but gradually normalize in 5-7 days . Long-term BP goal normotensive  Dysphagia  Secondary to stroke  Soft Nectar thick liquids  SLP on board   Other Stroke Risk Factors  Advanced age  Cigarette smoker, advised to stop smoking  ETOH use, advised to drink no more  than 1 drink(s) a day  Other Active Problems  Breast cancer status post left mastectomy 05/09/2019  Hospital day # 3  Continue aspirin for stroke prevention for now.  Check   loop recorder for paroxysmal A. fib.  Transfer for rehab to skilled nursing facility when bed available.  Discussed with Dr. Dante Gang.  Follow-up as an outpatient in the stroke clinic.  Stroke team will sign off.  Kindly call for questions. Antony Contras, MD Medical Director [facility] Pager: [phone] 05/18/2019 12:47 PM   To contact Stroke Continuity provider, please refer to [email or web address] After hours, contact General Neurology

## 2019-05-18 NOTE — CV Procedure (Signed)
INDICATIONS: stroke  PROCEDURE:   Informed consent was obtained prior to the procedure. The risks, benefits and alternatives for the procedure were discussed and the patient comprehended these risks.  Risks include, but are not limited to, cough, sore throat, vomiting, nausea, somnolence, esophageal and stomach trauma or perforation, bleeding, low blood pressure, aspiration, pneumonia, infection, trauma to the teeth and death.    After a procedural time-out, the oropharynx was anesthetized with 20% benzocaine spray.   During this procedure the patient was administered a total of Versed 4 mg and Fentanyl 50 mg to achieve and maintain moderate conscious sedation.  The patient's heart rate, blood pressure, and oxygen saturationweare monitored continuously during the procedure. The period of conscious sedation was 20 minutes, of which I was present face-to-face 100% of this time.  The transesophageal probe was inserted in the esophagus and stomach without difficulty and multiple views were obtained.  The patient was kept under observation until the patient left the procedure room.  The patient left the procedure room in stable condition.    Agitated microbubble saline contrast was administered.  COMPLICATIONS:    There were no immediate complications.  FINDINGS:  Positive PFO with bidirectional shunt at rest Normal biventricular function.  Aortic atherosclerosis in descending aorta and aortic arch Trivial mitral valve regurgitation. Possible tiny degenerative strand on mitral valve anterolaterally.   RECOMMENDATIONS:    Can return to hospital room when alert  Time Spent Directly with the Patient:  45 minutes   Elouise Munroe 05/18/2019, 9:28 AM

## 2019-05-18 NOTE — Care Management Important Message (Signed)
Important Message  Patient Details  Name: Jessica Harrell MRN: [id number] Date of Birth: 06/09/1941   Medicare Important Message Given:  Yes    Orbie Pyo 05/18/2019, 1:31 PM

## 2019-05-18 NOTE — Progress Notes (Signed)
  Speech Language Pathology Treatment: Dysphagia  Patient Details Name: Jessica Harrell MRN: [id number] DOB: 09-21-1941 Today's Date: 05/18/2019 Time: 9509-3267 SLP Time Calculation (min) (ACUTE ONLY): 15 min  Assessment / Plan / Recommendation Clinical Impression  Pt today seen after TEE. She is surprisingly alert.  RN reports she fed pt breakfast with no evidence of coughing.  Fortunately pt does cough with large amount of aspiration.    SLP provided pt with nectar thick applejuice - assuring she was sitting fully upright without head extension. She demonstrated timely swallow from clinical observation but did present with strong cough x1 - likely due to aspiration.   Reviewed importance of aspiration precautions using teach back with pt including need for head to be in neutral position and increased asp pna risk with current status.    Demonstration of call bell use completed with pt however with her ataxia, it is difficult for her to locate built up button on the bell and thus SlP requested RN order a soft call bell.    Discussed with Rn importance of pt's position and compensation strategies. SLP will follow up for dysphagia management but she will likely require modified diet for some time given her premorbid deficits.     HPI HPI: Jessica Harrell is an 78 y.o. female with breast CA status post mastectomy last week who was discharged home on Wednesday, did well for 2 days, then became very weak and lethargic on Saturday with confusion and decreased po intake- diagnosed with encephalopathy. She presented to the [facility] ED, where a CT head revealed bilateral cerebellar and left occipital lobe hypoattenuation. She was outside of the window for TPA.  Pt has h/o smoking and reports she is a current smoker.  Pt underwent MBS yesterday and today follow up indicated to assure tolerating po well.      SLP Plan  Continue with current plan of care       Recommendations  Diet  recommendations: Dysphagia 3 (mechanical soft);Nectar-thick liquid Liquids provided via: Cup;Straw Medication Administration: Whole meds with puree Supervision: Staff to assist with self feeding Compensations: Slow rate;Small sips/bites(multiple liquid swallow after every bite of food) Postural Changes and/or Swallow Maneuvers: Seated upright 90 degrees;Upright 30-60 min after meal                Oral Care Recommendations: Oral care BID Follow up Recommendations: Skilled Nursing facility SLP Visit Diagnosis: Cognitive communication deficit (R41.841);Attention and concentration deficit Attention and concentration deficit following: Cerebral infarction Plan: Continue with current plan of care       GO                [name] 05/18/2019, 12:24 PM Luanna Salk, [facility] [facility] SLP Acute Rehab Services Pager [phone] Office [phone]

## 2019-05-18 NOTE — Progress Notes (Signed)
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  In the emergency department, patient was afebrile hemodynamically stable.  WBC 12.8.  Lactic acid 4.4.  Troponin negative.  EKG sinus rhythm.  CT scan of the brain showed multifocal infarcts in the bilateral cerebellum and left occipital lobe.  MRI showed multifocal stroke and transferred to [facility].  Seen and followed by neurology. 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   Assessment & Plan:   Active Problems:   Acute ischemic stroke (HCC)   Acute metabolic encephalopathy  Acute multifocal stroke: In a postop patient.  With cortical blindness. -Suspect embolic stroke in a patient with malignancy and recent surgery. -MRI brain resulted shows multifocal stroke.  MRA of the brain with no large vessel occlusion. -2D echocardiogram essentially normal.  TEE showed PFO with bilateral flow, lower extremity duplexes negative for DVT.  Loop recorder planned. -CTA head and neck did not demonstrate any dissection or embolism.   -Currently on aspirin, she will continue. -Appreciate neurology input. -Continue to work with PT OT and speech.  She will be started on mechanical soft diet and nectar thick liquid. -Hemoglobin A1c 5.6 -LDL 151  Acute metabolic encephalopathy: Due to acute stroke.  Continue to monitor.  Patient has lactic acidosis but no evidence of infection.  Will monitor.  This is likely due to volume depletion.  Blood cultures has been done.  Left breast wound dehiscence: Status post mastectomy last week.  Seen by surgery.  Suggested dry dressing.  Smoker: Counseled to  quit.  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  Currently on letrozole.  Status post mastectomy.  I called and discussed case with her oncologist.  They will see her.  DVT prophylaxis: Lovenox Code Status: Full code Family Communication: Patient's daughter was called and updated. Disposition Plan: Acute inpatient rehab versus SNF.    Consultants:   Neurology  Surgery  Oncology  Procedures:   TEE  Antimicrobials:   None   Subjective: Patient was seen and examined.  Came back from procedure.  Sleepy.  Neurological status unchanged.  Blindness persist.  Objective: Vitals:   05/18/19 0925 05/18/19 0931 05/18/19 0940 05/18/19 1140  BP: 123/78 (!) 123/56 104/69 124/60  Pulse: 70 62 (!) 59 67  Resp: 17 15 14 14   Temp:  (!) 97.5 F (36.4 C)  97.6 F (36.4 C)  TempSrc:  Oral  Oral  SpO2: 98% 96% 95% 96%  Weight:      Height:        Intake/Output Summary (Last 24 hours) at 05/18/2019 1507 Last data filed at 05/18/2019 1100 Gross per 24 hour  Intake 1641.07 ml  Output 640 ml  Net 1001.07 ml   Filed Weights   05/15/19 0844 05/15/19 2121 05/18/19 0842  Weight: 64 kg 64.9 kg 64.9 kg    Examination:  General exam: Appears calm and comfortable, on room air.  Sleepy now.   Respiratory system: Clear to auscultation. Respiratory effort normal. Cardiovascular system: S1 & S2 heard, RRR. No JVD, murmurs, rubs, gallops or clicks. No pedal edema.  Postsurgical wound inspected by surgery, on dressing clean and dry.  Not removed by me. Gastrointestinal system:  Abdomen is nondistended, soft and nontender. No organomegaly or masses felt. Normal bowel sounds heard. Central nervous system: Alert and oriented.  Left-sided mild facial droop.  Blindness both eyes, can see finger waving. Extremities: Symmetric 5 x 5 power. Skin: No rashes, lesions or ulcers Psychiatry: Judgement and insight appear normal. Mood & affect anxious.    Data  Reviewed: I have personally reviewed following labs and imaging studies  CBC: Recent Labs  Lab 05/15/19 0910  WBC 12.8*  NEUTROABS 11.5*  HGB 14.7  HCT 43.6  MCV 94.0  PLT 355   Basic Metabolic Panel: Recent Labs  Lab 05/15/19 0910  NA 133*  K 3.9  CL 95*  CO2 20*  GLUCOSE 159*  BUN 16  CREATININE 0.69  CALCIUM 9.2   GFR: Estimated Creatinine Clearance: 59.1 mL/min (by C-G formula based on SCr of 0.69 mg/dL). Liver Function Tests: Recent Labs  Lab 05/15/19 0910  AST 20  ALT 16  ALKPHOS 58  BILITOT 1.2  PROT 7.2  ALBUMIN 3.7   Recent Labs  Lab 05/15/19 0910  LIPASE 25   No results for input(s): AMMONIA in the last 168 hours. Coagulation Profile: Recent Labs  Lab 05/15/19 0920  INR 0.9   Cardiac Enzymes: Recent Labs  Lab 05/15/19 0910  TROPONINI <0.03   BNP (last 3 results) No results for input(s): PROBNP in the last 8760 hours. HbA1C: Recent Labs    05/17/19 0419  HGBA1C 5.6   CBG: No results for input(s): GLUCAP in the last 168 hours. Lipid Profile: Recent Labs    05/17/19 0419  CHOL 215*  HDL 46  LDLCALC 139*  TRIG 148  CHOLHDL 4.7   Thyroid Function Tests: No results for input(s): TSH, T4TOTAL, FREET4, T3FREE, THYROIDAB in the last 72 hours. Anemia Panel: No results for input(s): VITAMINB12, FOLATE, FERRITIN, TIBC, IRON, RETICCTPCT in the last 72 hours. Sepsis Labs: Recent Labs  Lab 05/15/19 0910 05/15/19 1115  LATICACIDVEN 2.0* 4.4*    Recent Results (from the past 240 hour(s))  Culture, blood (Routine X 2) w Reflex to ID Panel     Status: None (Preliminary result)   Collection Time: 05/15/19  9:10 AM   Specimen: Left Antecubital; Blood  Result Value Ref Range Status   Specimen Description LEFT ANTECUBITAL  Final   Special Requests   Final    BOTTLES DRAWN AEROBIC AND ANAEROBIC Blood Culture adequate volume   Culture   Final    NO GROWTH 3 DAYS Performed at [facility], [address], [place], [place] [address]     Report Status PENDING  Incomplete  Urine culture     Status: None   Collection Time: 05/15/19  9:11 AM   Specimen: Urine, Clean Catch  Result Value Ref Range Status   Specimen Description   Final    URINE, CLEAN CATCH Performed at [facility], [address] [place], [place] [address]    Special Requests   Final    NONE Performed at [facility], [address] [place], [place] [address]    Culture   Final    NO GROWTH Performed at [facility], [place] [address] [place], [place] [address]    Report Status 05/16/2019 FINAL  Final  SARS Coronavirus 2 (CEPHEID - Performed in [place] hospital lab), Hosp Order     Status: None   Collection Time: 05/15/19  9:12 AM   Specimen: Nasopharyngeal Swab  Result Value Ref Range Status   SARS Coronavirus 2  NEGATIVE NEGATIVE Final    Comment: (NOTE) If result is NEGATIVE SARS-CoV-2 target nucleic acids are NOT DETECTED. The SARS-CoV-2 RNA is generally detectable in upper and lower  respiratory specimens during the acute phase of infection. The lowest  concentration of SARS-CoV-2 viral copies this assay can detect is 250  copies / mL. A negative result does not preclude SARS-CoV-2 infection  and should not be used as the sole basis for treatment or other  patient management decisions.  A negative result may occur with  improper specimen collection / handling, submission of specimen other  than nasopharyngeal swab, presence of viral mutation(s) within the  areas targeted by this assay, and inadequate number of viral copies  (<250 copies / mL). A negative result must be combined with clinical  observations, patient history, and epidemiological information. If result is POSITIVE SARS-CoV-2 target nucleic acids are DETECTED. The SARS-CoV-2 RNA is generally detectable in upper and lower  respiratory specimens dur ing the acute phase of infection.  Positive  results are indicative of active infection with SARS-CoV-2.  Clinical   correlation with patient history and other diagnostic information is  necessary to determine patient infection status.  Positive results do  not rule out bacterial infection or co-infection with other viruses. If result is PRESUMPTIVE POSTIVE SARS-CoV-2 nucleic acids MAY BE PRESENT.   A presumptive positive result was obtained on the submitted specimen  and confirmed on repeat testing.  While 2019 novel coronavirus  (SARS-CoV-2) nucleic acids may be present in the submitted sample  additional confirmatory testing may be necessary for epidemiological  and / or clinical management purposes  to differentiate between  SARS-CoV-2 and other Sarbecovirus currently known to infect humans.  If clinically indicated additional testing with an alternate test  methodology [id number]) is advised. The SARS-CoV-2 RNA is generally  detectable in upper and lower respiratory sp ecimens during the acute  phase of infection. The expected result is Negative. Fact Sheet for Patients:  StrictlyIdeas.no Fact Sheet for Healthcare Providers: BankingDealers.co.za This test is not yet approved or cleared by the [place] FDA and has been authorized for detection and/or diagnosis of SARS-CoV-2 by FDA under an Emergency Use Authorization (EUA).  This EUA will remain in effect (meaning this test can be used) for the duration of the COVID-19 declaration under Section 564(b)(1) of the Act, 21 U.S.C. section 360bbb-3(b)(1), unless the authorization is terminated or revoked sooner. Performed at [facility], [address], [place], [place] [address]   Culture, blood (Routine X 2) w Reflex to ID Panel     Status: None (Preliminary result)   Collection Time: 05/15/19  3:44 PM   Specimen: BLOOD RIGHT WRIST  Result Value Ref Range Status   Specimen Description BLOOD RIGHT WRIST  Final   Special Requests   Final    BOTTLES DRAWN AEROBIC ONLY Blood Culture adequate volume    Culture   Final    NO GROWTH 3 DAYS Performed at [facility], [address] [place], [place] [address]    Report Status PENDING  Incomplete         Radiology Studies: Ct Chest Wo Contrast  Result Date: 05/17/2019 CLINICAL DATA:  Shortness of breath. Lethargy. Asymmetric left upper lobe density on neck CTA. History of breast cancer. EXAM: CT CHEST WITHOUT CONTRAST TECHNIQUE: Multidetector CT imaging of the chest was performed following the standard protocol without IV contrast. COMPARISON:  Head and neck CTA 05/16/2019. Chest radiograph 05/15/2019. FINDINGS: Cardiovascular: Aortic and coronary artery atherosclerosis.  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 Normal caliber of the ascending aorta. Normal heart size. No pericardial effusion. Mediastinum/Nodes: Recent left mastectomy with postoperative gas in the left chest wall and a drain in place without evidence of a fluid collection. No enlarged axillary, mediastinal, or hilar lymph nodes. Unremarkable thyroid. Residual barium throughout collapsed esophagus. Small sliding hiatal hernia. Lungs/Pleura: Trace left pleural effusion with atelectasis in the basilar left lower lobe. Biapical pleuroparenchymal scarring. 1.4 cm asymmetric density in the right upper lobe described on neck CTA has a curvilinear configuration on axial and sagittal images most suggestive of scarring. There is an approximately 2.5 cm opacity in the right lower lobe which appears partially ground glass/subsolid, however assessment is limited by prominent motion artifact through this region. Additional scattered subcentimeter ground glass and solid nodules are present in both lungs. There is mild peribronchial thickening. Upper Abdomen: Residual barium in the stomach and small bowel. Aortic atherosclerosis. Musculoskeletal: No acute osseous abnormality or suspicious osseous lesion. IMPRESSION: 1. 2.5 cm opacity in the right  lower lobe, possibly infectious/inflammatory though with assessment limited by motion artifact. Additional indeterminate subcentimeter bilateral lung nodules. Follow-up chest CT is recommended in 3 months. 2. Trace left pleural effusion with left lower lobe atelectasis. 3. Biapical lung scarring. 4. Recent left mastectomy with postoperative gas and drain in place. No fluid collection. 5. 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 Recommend annual imaging followup by CTA or MRA. This recommendation follows 2010 ACCF/AHA/AATS/ACR/ASA/SCA/SCAI/SIR/STS/SVM Guidelines for the Diagnosis and Management of Patients with Thoracic Aortic Disease. Circulation. 2010; 121: D782-U235. Aortic Atherosclerosis (ICD10-I70.0). Aortic aneurysm NOS (ICD10-I71.9). Electronically Signed   By: Logan Bores M.D.   On: 05/17/2019 10:58   Dg Swallowing Func-speech Pathology  Result Date: 05/17/2019 Objective Swallowing Evaluation: Type of Study: MBS-Modified Barium Swallow Study  Patient Details Name: Syna Gad MRN: [id number] Date of Birth: 1941-10-31 Today's Date: 05/17/2019 Time: SLP Start Time (ACUTE ONLY): 0845 -SLP Stop Time (ACUTE ONLY): 0915 SLP Time Calculation (min) (ACUTE ONLY): 30 min Past Medical History: Past Medical History: Diagnosis Date  Breast cancer ([place])   Right foot pain Nov. 2014 Past Surgical History: Past Surgical History: Procedure Laterality Date  BREAST BIOPSY    CATARACT EXTRACTION    both eyes  EYE SURGERY Left   MASTECTOMY W/ SENTINEL NODE BIOPSY Left 05/09/2019  Procedure: LEFT MASTECTOMY WITH LEFT AXILLARY SENTINEL LYMPH NODE BIOPSY;  Surgeon: Coralie Keens, MD;  Location: [facility];  Service: General;  Laterality: Left; HPI: Sophea Rackham is an 78 y.o. female with breast CA status post mastectomy last week who was discharged home on Wednesday, did well for 2 days, then became very weak and lethargic on Saturday with confusion and decreased po  intake- diagnosed with encephalopathy. She presented to the [facility] ED, where a CT head revealed bilateral cerebellar and left occipital lobe hypoattenuation. She was outside of the window for TPA.  Pt has h/o smoking and reports she is a current smoker.  Speech and swallow evaluation ordered.   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  This results in decreased oral control, premature spillage of boluses into pharynx/larynx and decreased timing of laryngeal closure. GROSS aspiration of thin via cup noted also due pt having difficulty holding her cup - thus taking very large  boluses.  Overt cough noted with gross aspiration but this did not clear aspirates.  Laryngeal penetration to vocal cords present with tsp of thin - TRACE.  Chin tuck posture using straw continued to allow laryngeal penetration of thin to cords.  No aspiration/penetration of nectar, pudding, moist solid or pudding with tablet.  Discoordination/weakness also allows oropharyngeal residuals across consistencies. Cued dry swallow helpful to decrease residue but difficult for pt to perform.  Following solids with nectar helpful.  Please note, upon esophageal sweep after swallowing barium tablet taken with pudding pt appears with residuals throughout with retrograde propulsion without awareness.  Esophagus appears widened - multiple swallows of nectar effective to largely clear esophagus. Suspect some component of dysmotility - radiologist not present to confirm. Recommend strict aspiration precautions for Ms Cap[place]ran.  Will follow up for dysphagia management.   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 Frontal lobe and executive function deficit  following -- Impact on safety and function Moderate aspiration risk   CHL IP TREATMENT RECOMMENDATION 05/17/2019 Treatment Recommendations Therapy as outlined in treatment plan below   Prognosis 05/17/2019 Prognosis for Safe Diet Advancement Fair Barriers to Reach Goals Other (Comment) Barriers/Prognosis Comment -- CHL IP DIET RECOMMENDATION 05/17/2019 SLP Diet Recommendations Dysphagia 3 (Mech soft) solids;Nectar thick liquid Liquid Administration via -- Medication Administration Whole meds with puree Compensations Slow rate;Small sips/bites;Follow solids with liquid Postural Changes Seated upright at 90 degrees;Remain semi-upright after after feeds/meals (Comment)   CHL IP OTHER RECOMMENDATIONS 05/17/2019 Recommended Consults -- Oral Care Recommendations Oral care before and after PO Other Recommendations --   CHL IP FOLLOW UP RECOMMENDATIONS 05/17/2019 Follow up Recommendations Skilled Nursing facility   [facility] IP FREQUENCY AND DURATION 05/17/2019 Speech Therapy Frequency (ACUTE ONLY) min 2x/week Treatment Duration 2 weeks      CHL IP ORAL PHASE 05/17/2019 Oral Phase Impaired Oral - Pudding Teaspoon -- Oral - Pudding Cup -- Oral - Honey Teaspoon -- Oral - Honey Cup -- Oral - Nectar Teaspoon -- Oral - Nectar Cup Premature spillage;Decreased bolus cohesion;Weak lingual manipulation Oral - Nectar Straw Weak lingual manipulation;Decreased bolus cohesion;Premature spillage Oral - Thin Teaspoon Weak lingual manipulation;Premature spillage;Decreased bolus cohesion Oral - Thin Cup Weak lingual manipulation;Premature spillage;Decreased bolus cohesion Oral - Thin Straw Weak lingual manipulation;Premature spillage;Decreased bolus cohesion Oral - Puree Weak lingual manipulation;Piecemeal swallowing;Decreased bolus cohesion Oral - Mech Soft Weak lingual manipulation;Piecemeal swallowing;Decreased bolus cohesion Oral - Regular -- Oral - Multi-Consistency -- Oral - Pill Weak lingual manipulation;Decreased bolus cohesion;Reduced posterior  propulsion Oral Phase - Comment --  CHL IP PHARYNGEAL PHASE 05/17/2019 Pharyngeal Phase Impaired Pharyngeal- Pudding Teaspoon -- Pharyngeal -- Pharyngeal- Pudding Cup -- Pharyngeal -- Pharyngeal- Honey Teaspoon -- Pharyngeal -- Pharyngeal- Honey Cup -- Pharyngeal -- Pharyngeal- Nectar Teaspoon NT Pharyngeal -- Pharyngeal- Nectar Cup Pharyngeal residue - valleculae;Reduced tongue base retraction Pharyngeal -- Pharyngeal- Nectar Straw Reduced tongue base retraction;Pharyngeal residue - valleculae;Pharyngeal residue - pyriform Pharyngeal -- Pharyngeal- Thin Teaspoon Penetration/Aspiration during swallow;Reduced epiglottic inversion;Pharyngeal residue - valleculae;Pharyngeal residue - pyriform Pharyngeal Material enters airway, CONTACTS cords and not ejected out Pharyngeal- Thin Cup Penetration/Aspiration during swallow;Significant aspiration (Amount) Pharyngeal Material enters airway, passes BELOW cords and not ejected out despite cough attempt by patient Pharyngeal- Thin Straw Penetration/Aspiration during swallow;Pharyngeal residue - valleculae;Pharyngeal residue - pyriform Pharyngeal -- Pharyngeal- Puree Reduced epiglottic inversion;Pharyngeal residue - valleculae;Reduced tongue base retraction Pharyngeal -- Pharyngeal- Mechanical Soft Reduced epiglottic inversion;Reduced tongue base retraction;Pharyngeal residue - valleculae Pharyngeal -- Pharyngeal-  Regular -- Pharyngeal -- Pharyngeal- Multi-consistency -- Pharyngeal -- Pharyngeal- Pill WFL;Pharyngeal residue - valleculae;Reduced tongue base retraction;Reduced epiglottic inversion Pharyngeal -- Pharyngeal Comment pharyngeal retention mixed with secretions  CHL IP CERVICAL ESOPHAGEAL PHASE 05/17/2019 Cervical Esophageal Phase Impaired Pudding Teaspoon -- Pudding Cup -- Honey Teaspoon -- Honey Cup -- Nectar Teaspoon -- Nectar Cup -- Nectar Straw -- Thin Teaspoon -- Thin Cup -- Thin Straw -- Puree -- Mechanical Soft -- Regular -- Multi-consistency -- Pill -- Cervical  Esophageal Comment upon esophageal sweep after barium tablet taken with pudding - pt appears with residuals throughout with retrograde propulsion without awareness, esophagus appears widened - multiple swallows of nectar effective to clear Luanna Salk, MS [facility] SLP Acute Rehab Services Pager [phone] Office [phone] [name] 05/17/2019, 10:21 AM              Vas [place] Lower Extremity Venous (dvt)  Result Date: 05/17/2019  Lower Venous Study Indications: Swelling, Pain, and stroke.  Limitations: Patient positioning. Performing Technologist: Abram Sander RVS  Examination Guidelines: A complete evaluation includes B-mode imaging, spectral Doppler, color Doppler, and power Doppler as needed of all accessible portions of each vessel. Bilateral testing is considered an integral part of a complete examination. Limited examinations for reoccurring indications may be performed as noted.  +---------+---------------+---------+-----------+----------+-------+  RIGHT     Compressibility Phasicity Spontaneity Properties Summary  +---------+---------------+---------+-----------+----------+-------+  CFV       Full            Yes       Yes                             +---------+---------------+---------+-----------+----------+-------+  SFJ       Full                                                      +---------+---------------+---------+-----------+----------+-------+  FV Prox   Full                                                      +---------+---------------+---------+-----------+----------+-------+  FV Mid    Full                                                      +---------+---------------+---------+-----------+----------+-------+  FV Distal Full                                                      +---------+---------------+---------+-----------+----------+-------+  PFV       Full                                                      +---------+---------------+---------+-----------+----------+-------+  POP       Full            Yes       Yes                             +---------+---------------+---------+-----------+----------+-------+  PTV       Full                                                      +---------+---------------+---------+-----------+----------+-------+  PERO      Full                                                      +---------+---------------+---------+-----------+----------+-------+   +---------+---------------+---------+-----------+----------+-------+  LEFT      Compressibility Phasicity Spontaneity Properties Summary  +---------+---------------+---------+-----------+----------+-------+  CFV       Full            Yes       Yes                             +---------+---------------+---------+-----------+----------+-------+  SFJ       Full                                                      +---------+---------------+---------+-----------+----------+-------+  FV Prox   Full                                                      +---------+---------------+---------+-----------+----------+-------+  FV Mid    Full                                                      +---------+---------------+---------+-----------+----------+-------+  FV Distal Full                                                      +---------+---------------+---------+-----------+----------+-------+  PFV       Full                                                      +---------+---------------+---------+-----------+----------+-------+  POP       Full            Yes       Yes                             +---------+---------------+---------+-----------+----------+-------+  PTV       Full                                                      +---------+---------------+---------+-----------+----------+-------+  PERO      Full                                                      +---------+---------------+---------+-----------+----------+-------+     Summary: Right: There is no evidence of deep vein thrombosis in the lower  extremity. No cystic structure found in the popliteal fossa. Left: There is no evidence of deep vein thrombosis in the lower extremity. No cystic structure found in the popliteal fossa.  *See table(s) above for measurements and observations. Electronically signed by Curt Jews MD on 05/17/2019 at 5:01:36 PM.    Final         Scheduled Meds:   stroke: mapping our early stages of recovery book   Does not apply Once   aspirin EC  81 mg Oral Daily   cholecalciferol  1,000 Units Oral Daily   enoxaparin (LOVENOX) injection  40 mg Subcutaneous Q24H   letrozole  2.5 mg Oral Daily   rosuvastatin  10 mg Oral q1800   Continuous Infusions:  sodium chloride 75 mL/hr at 05/18/19 1454     LOS: 3 days    Time spent: 25 minutes    Barb Merino, MD [facility] Pager [phone]  If 7PM-7AM, please contact night-coverage [email or web address] Password [facility] 05/18/2019, 3:07 PM

## 2019-05-18 NOTE — Interval H&P Note (Signed)
History and Physical Interval Note:  05/18/2019 8:56 AM  Jessica Harrell  has presented today for surgery, with the diagnosis of stroke.  The various methods of treatment have been discussed with the patient and family. After consideration of risks, benefits and other options for treatment, the patient has consented to  Procedure(s): TRANSESOPHAGEAL ECHOCARDIOGRAM (TEE) (N/A) as a surgical intervention.  The patient's history has been reviewed, patient examined, no change in status, stable for surgery.  I have reviewed the patient's chart and labs.  Questions were answered to the patient's satisfaction.     Elouise Munroe

## 2019-05-18 NOTE — TOC Initial Note (Signed)
Transition of Care [facility]) - Initial/Assessment Note    Patient Details  Name: Jessica Harrell MRN: [id number] Date of Birth: September 28, 1941  Transition [facility]) CM/SW Contact:    Geralynn Ochs, LCSW Phone Number: 05/18/2019, 3:57 PM  Clinical Narrative:   CSW spoke with patient's daughter, Jessica Harrell, to discuss SNF placement. Jessica Harrell indicated that they just did not want [facility], but other than that, they didn't know anything about any of the other SNFs. CSW provided Jessica Harrell with bed offers. Jessica Harrell said she will need to review options with her dad and her aunt, and that she will get back to [place] with decision. CSW to follow.                Expected Discharge Plan: Skilled Nursing Facility Barriers to Discharge: Continued Medical Work up, Ship broker   Patient Goals and CMS Choice Patient states their goals for this hospitalization and ongoing recovery are:: patient unable to participate in goal setting at this time, not oriented CMS Medicare.gov Compare Post Acute Care list provided to:: Patient Choice offered to / list presented to : Patient  Expected Discharge Plan and Services Expected Discharge Plan: [place] Choice: [place] arrangements for the past 2 months: Single Family Home                                      Prior Living Arrangements/Services Living arrangements for the past 2 months: Single Family Home Lives with:: Self, Spouse Patient language and need for interpreter reviewed:: No Do you feel safe going back to the place where you live?: Yes      Need for Family Participation in Patient Care: Yes (Comment) Care giver support system in place?: No (comment)   Criminal Activity/Legal Involvement Pertinent to Current Situation/Hospitalization: No - Comment as needed  Activities of Daily Living      Permission Sought/Granted Permission sought to share information  with : Facility Sport and exercise psychologist, Family Supports Permission granted to share information with : Yes, Verbal Permission Granted  Share Information with NAME: Jessica Harrell  Permission granted to share info w AGENCY: SNF  Permission granted to share info w Relationship: Daughter     Emotional Assessment   Attitude/Demeanor/Rapport: Unable to Assess Affect (typically observed): Unable to Assess Orientation: : Oriented to Self Alcohol / Substance Use: Not Applicable Psych Involvement: No (comment)  Admission diagnosis:  Encephalopathy [G93.40] Acute ischemic stroke [facility]) [I63.9] Patient Active Problem List   Diagnosis Date Noted  . Acute ischemic stroke ([place]) 05/15/2019  . Acute metabolic encephalopathy 42/68/3419  . Encephalopathy   . History of left breast cancer 05/09/2019  . Malignant neoplasm of lower-inner quadrant of left breast in female, estrogen receptor positive ([place]) 04/26/2018   PCP:  [name], MD Pharmacy:   [facility], [place] [address] [place] [address] Phone: [phone] Fax: [phone]     Social Determinants of Health (SDOH) Interventions    Readmission Risk Interventions No flowsheet data found.

## 2019-05-19 DIAGNOSIS — I6389 Other cerebral infarction: Secondary | ICD-10-CM

## 2019-05-19 LAB — SARS CORONAVIRUS 2 BY RT PCR (HOSPITAL ORDER, PERFORMED IN ~~LOC~~ HOSPITAL LAB): SARS Coronavirus 2: NEGATIVE

## 2019-05-19 MED ORDER — ROSUVASTATIN CALCIUM 10 MG PO TABS: 10.0000 mg | ORAL_TABLET | Freq: Every day | ORAL | 2 refills | Status: DC

## 2019-05-19 NOTE — Progress Notes (Signed)
Occupational Therapy Treatment Patient Details Name: Jessica Harrell MRN: [id number] DOB: 1941/10/30 Today's Date: 05/19/2019    History of present illness Patient is a 78 year old female admitted with acute CVA of bilateral cerebellum, L thalamus and left tectum. She is S/P bilateral mastectomy on 05/09/2019. She had gone home for 2 days before coming back to the hospital with stroke like symptoms. 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 Pt performing bed mobility with minguardA. Pt sitting EOB x10 mins for light grooming tasks- noted pt leaning to L and requirign multimodal cues to sit upright and set-upA for ADL. Pt trying LB dressing at EOB. Pt bending too far forward for LLE to don sock with figure 4 technique, but able to be more controlled with RLE donning. Pt requiring cues to sit upright and not lean. Pt sit to stand x3 times with min to modA +2, unable to advance steps beyond 2-3 due to ataxic gait. Pt unsafe to take care of self. Pt progressing. OT following acutely for progression of ADL and mobility.     Follow Up Recommendations  CIR;SNF;Supervision/Assistance - 24 hour    Equipment Recommendations  None recommended by OT    Recommendations for Other Services      Precautions / Restrictions Precautions Precautions: Fall;Other (comment) Precaution Comments: Cortical blindness Restrictions Weight Bearing Restrictions: No       Mobility Bed Mobility Overal bed mobility: Needs Assistance Bed Mobility: Supine to Sit;Sit to Supine     Supine to sit: Min guard Sit to supine: Min guard   General bed mobility comments: Requiring multimodal cues to sit upright for ADL tasks, leaning to L noted.  Transfers Overall transfer level: Needs assistance Equipment used: Rolling walker (2 wheeled) Transfers: Sit to/from  Stand Sit to Stand: Mod assist;+2 physical assistance         General transfer comment: Pt with x3 sit to stands in prep to advance steps, but unable to take more than 2 steps away from bed due to ataxic gait.    Balance Overall balance assessment: Needs assistance Sitting-balance support: Bilateral upper extremity supported;Feet supported Sitting balance-Leahy Scale: Fair Sitting balance - Comments: more assist required today to keep her from falling to the left    Standing balance support: Bilateral upper extremity supported Standing balance-Leahy Scale: Poor Standing balance comment: unable to advance steps due to severe ataxia                           ADL either performed or assessed with clinical judgement   ADL Overall ADL's : Needs assistance/impaired     Grooming: Wash/dry hands;Wash/dry face;Oral care;Brushing hair;Set up;Sitting Grooming Details (indicate cue type and reason): Pt sitting EOB with intermittent assist for dynamic sitting balance as pt would lean to L side requiring mutlimodal cues to sit upright.              Lower Body Dressing: Minimal assistance;Sitting/lateral leans Lower Body Dressing Details (indicate cue type and reason): Pt bending too far forward for LLE to don sock with figure 4 technique, but able to be more controlled with RLE donning. Pt requiring cues to sit upright and not lean              Functional mobility during ADLs: Maximal assistance;Rolling walker General ADL Comments: Pt  tolerating ADL with ModA overall for ADL and pt with cortical blindness and in learning to compensate.     Vision   Vision Assessment?: Yes Eye Alignment: Impaired (comment) Ocular Range of Motion: Impaired-to be further tested in functional context Alignment/Gaze Preference: Gaze right;Head tilt Tracking/Visual Pursuits: Unable to hold eye position out of midline;Requires cues, head turns, or add eye shifts to track;Impaired - to be  further tested in functional context Visual Fields: Impaired-to be further tested in functional context Additional Comments: Able to locate this OTR once.   Perception     Praxis      Cognition Arousal/Alertness: Awake/alert Behavior During Therapy: Impulsive;Flat affect Overall Cognitive Status: Impaired/Different from baseline Area of Impairment: Orientation;Memory;Attention;Following commands;Safety/judgement;Awareness;Problem solving                 Orientation Level: Place;Time;Situation Current Attention Level: Focused Memory: Decreased short-term memory Following Commands: Follows one step commands with increased time Safety/Judgement: Decreased awareness of safety;Decreased awareness of deficits Awareness: Intellectual Problem Solving: Slow processing;Decreased initiation;Difficulty sequencing;Requires verbal cues;Requires tactile cues General Comments: Patient is very pleasent but confused         Exercises Exercises: General Upper Extremity   Shoulder Instructions       General Comments 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    Pertinent Vitals/ Pain       Pain Assessment: No/denies pain  Home Living                                          Prior Functioning/Environment              Frequency  Min 3X/week        Progress Toward Goals  OT Goals(current goals can now be found in the care plan section)  Progress towards OT goals: Progressing toward goals  Acute Rehab OT Goals Patient Stated Goal: unable to state  OT Goal Formulation: Patient unable to participate in goal setting Time For Goal Achievement: 05/27/19 Potential to Achieve Goals: Poor ADL Goals Pt Will Perform Grooming: with min assist Pt Will Perform Upper Body Bathing: with min assist Pt Will Perform Lower Body Bathing: with min assist Pt Will Perform Upper  Body Dressing: with min assist Pt Will Perform Lower Body Dressing: with min assist Pt Will Transfer to Toilet: with min assist Pt Will Perform Toileting - Clothing Manipulation and hygiene: with min assist  Plan Discharge plan remains appropriate    Co-evaluation    PT/OT/SLP Co-Evaluation/Treatment: Yes Reason for Co-Treatment: Complexity of the patient's impairments (multi-system involvement) PT goals addressed during session: Mobility/safety with mobility;Balance;Proper use of DME;Strengthening/ROM OT goals addressed during session: ADL's and self-care      AM-PAC OT "6 Clicks" Daily Activity     Outcome Measure   Help from another person eating meals?: A Lot Help from another person taking care of personal grooming?: A Lot Help from another person toileting, which includes using toliet, bedpan, or urinal?: A Lot Help from another person bathing (including washing, rinsing, drying)?: A Lot Help from another person to put on and taking off regular upper body clothing?: A Lot Help from another person to put on and taking off regular lower body clothing?: A Lot 6 Click Score: 12    End of Session Equipment Utilized During Treatment: Gait belt;Rolling walker  OT Visit Diagnosis: Unsteadiness on feet (R26.81);Muscle weakness (generalized) (M62.81);Other symptoms and signs involving cognitive function   Activity Tolerance Patient tolerated treatment well   Patient Left in bed;with call bell/phone within reach;with bed alarm set   Nurse Communication Mobility status        Time: 3685-9923 OT Time Calculation (min): 24 min  Charges: OT General Charges $OT Visit: 1 Visit OT Treatments $Self Care/Home Management : 8-22 mins  Darryl Nestle) Marsa Aris OTR/L Acute Rehabilitation Services Pager: [phone] Office: [phone]    Jenene Slicker Kyndell Zeiser 05/19/2019, 2:06 PM

## 2019-05-19 NOTE — Consult Note (Addendum)
ELECTROPHYSIOLOGY CONSULT NOTE  Patient ID: Jessica Harrell MRN: [id number], DOB/AGE: May 01, 1941   Admit date: 05/15/2019 Date of Consult: 05/19/2019  Primary Physician: [name], MD Primary Cardiologist: none Reason for Consultation: Cryptogenic stroke ; recommendations regarding Implantable Loop Recorder, requested by Dr. Leonie Man  History of Present Illness Jessica Harrell was admitted on 05/15/2019 with stroke.  They first developed symptoms while at home recoup orating from her mastectomy only a couple days prior.   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  she has undergone workup for stroke including echocardiogram and carotid angio.  The patient has been monitored on telemetry which has demonstrated sinus rhythm with no arrhythmias.    TEE this admission demonstrated  IMPRESSIONS 1. Positive PFO with bidirectional flow - left to right shunt noted with color flow Doppler and right to left shunt noted with agitated saline.  2. No evidence of a thrombus present in the left atrial appendage. Normal emptying velocity 80 cm/s.  3. No left ventricular apical thrombus or LV mass.  4. The left ventricle has normal systolic function, with an ejection fraction of 60-65%. The cavity size was normal. Left ventricular diastolic function could not be evaluated.  5. The right ventricle has normal systolc function. The cavity was normal. There is no increase in right ventricular wall thickness.  6. Left atrial size was mildly dilated.  7. Mild sclerosis of the aortic valve, no aortic valve regurgitation.  8. The mitral valve is mildly degenerative  with a possible tiny degenerative strand on the lateral and anterior aspect of the valve, atrial side.  9. The aortic root and ascending aorta are normal in size and structure. 10. There is evidence of moderate plaque in the aortic arch and descending aorta.  FINDINGS  Left Ventricle: The left ventricle has normal systolic function, with an ejection fraction of 60-65%. The cavity size was normal. There is no increase in left ventricular wall thickness. Left ventricular diastolic function could not be evaluated.  Right Ventricle: The right ventricle has normal systolic function. The cavity was normal. There is no increase in right ventricular wall thickness.  Left Atrium: Left atrial size was mildly dilated.   Left Atrial Appendage: No evidence of a thrombus present in the left atrial appendage.  Right Atrium: Right atrial size was normal in size. Right atrial pressure is estimated at 10 mmHg.  Interatrial Septum: Agitated saline contrast was given intravenously to evaluate for intracardiac shunting. Saline contrast bubble study was positive, with evidence of interatrial shunting.  Pericardium: There is no evidence of pericardial effusion.  Mitral Valve: The mitral valve is degenerative in appearance. Mitral valve regurgitation is trivial by color flow Doppler.  Tricuspid Valve: The tricuspid valve was normal in structure. Tricuspid valve regurgitation is trivial by color flow Doppler.  Aortic Valve: The aortic valve is tricuspid Mild sclerosis of the aortic valve. Aortic valve regurgitation was not visualized by color flow Doppler.  Pulmonic Valve: The pulmonic valve was normal in structure. Pulmonic valve regurgitation is trivial by color flow Doppler.  Aorta: The aortic root and ascending aorta are normal in size and structure. There is evidence of moderate plaque in the aortic arch and descending aorta.    05/17/2019: LE  venous US Summary: Right: There is no evidence  of deep vein thrombosis in the lower extremity. No cystic structure found in the popliteal fossa. Left: There is no evidence of deep vein thrombosis in the lower extremity. No cystic structure found in the popliteal fossa.    Lab work is reviewed.  The patient is s/p L mastectomy 05/09/19, drain was removed this AM by surgical service wound. In d/w IM, mastectomy wound is reported to have some degree of dehiscence, though not felt to be infected.   Prior to admission, the patient denies chest pain, shortness of breath, dizziness, palpitations, or syncope.  They are recovering from their stroke with plans to rehab at discharge.   Past Medical History:  Diagnosis Date   Breast cancer ([place])    Right foot pain Nov. 2014     Surgical History:  Past Surgical History:  Procedure Laterality Date   BREAST BIOPSY     CATARACT EXTRACTION     both eyes   EYE SURGERY Left    MASTECTOMY W/ SENTINEL NODE BIOPSY Left 05/09/2019   Procedure: LEFT MASTECTOMY WITH LEFT AXILLARY SENTINEL LYMPH NODE BIOPSY;  Surgeon: Coralie Keens, MD;  Location: [facility];  Service: General;  Laterality: Left;     Medications Prior to Admission  Medication Sig Dispense Refill Last Dose   cholecalciferol (VITAMIN D) 1000 units tablet Take 1,000 Units by mouth daily.   Past Week at Unknown time   letrozole (FEMARA) 2.5 MG tablet TAKE 1 TABLET ONCE DAILY. (Patient taking differently: Take 2.5 mg by mouth daily. ) 90 tablet 0 Past Week at Unknown time   Red Yeast Rice Extract (RED YEAST RICE PO) Take 1 tablet by mouth daily.    Past Week at Unknown time   traMADol (ULTRAM) 50 MG tablet Take 1 tablet (50 mg total) by mouth every 6 (six) hours as needed for moderate pain or severe pain. 25 tablet 0 Past Week at Unknown time   aspirin EC 81 MG tablet Take 81 mg by mouth daily.   unknown    Inpatient Medications:    stroke: mapping our early stages of recovery book   Does not apply Once   aspirin EC  81 mg Oral  Daily   cholecalciferol  1,000 Units Oral Daily   enoxaparin (LOVENOX) injection  40 mg Subcutaneous Q24H   letrozole  2.5 mg Oral Daily   rosuvastatin  10 mg Oral q1800    Allergies:  Allergies  Allergen Reactions   Atorvastatin Other (See Comments)    Muscle pain     Social History   Socioeconomic History   Marital status: Married    Spouse name: Not on file   Number of children: Not on file   Years of education: Not on file   Highest education level: Not on file  Occupational History    Comment: retired  Scientist, product/process development strain: Not on file   Food insecurity    Worry: Not on file    Inability: Not on Lexicographer needs    Medical: Not on file    Non-medical: Not on file  Tobacco Use   Smoking status: Current Every Day Smoker    Packs/day: 0.50    Years: 58.00    Pack years: 29.00    Types: Cigarettes   Smokeless tobacco: Never Used  Substance and Sexual Activity   Alcohol use: Yes    Alcohol/week: 7.0 standard drinks    Types:  7 Cans of beer per week    Comment: 1 beer every 1-2 days   Drug use: No   Sexual activity: Not Currently  Lifestyle   Physical activity    Days per week: Not on file    Minutes per session: Not on file   Stress: Not on file  Relationships   Social connections    Talks on phone: Not on file    Gets together: Not on file    Attends religious service: Not on file    Active member of club or organization: Not on file    Attends meetings of clubs or organizations: Not on file    Relationship status: Not on file   Intimate partner violence    Fear of current or ex partner: Not on file    Emotionally abused: Not on file    Physically abused: Not on file    Forced sexual activity: Not on file  Other Topics Concern   Not on file  Social History Narrative   Resides in [place]. Has one grown daughter. No grandchildren. No pets.      Family History  Problem Relation Age of Onset    Cancer Mother        esophageal   Hypertension Mother    Varicose Veins Mother    Cancer Sister        non hodgkins   Cancer Other        unknown      Review of Systems: All other systems reviewed and are otherwise negative except as noted above.  Physical Exam: Vitals:   05/18/19 1920 05/19/19 0018 05/19/19 0351 05/19/19 0737  BP: 98/75 112/71 119/89 115/78  Pulse: 62 60 65 73  Resp: 15 12 14 18   Temp: 98.5 F (36.9 C) (!) 97.5 F (36.4 C) (!) 97.5 F (36.4 C) 97.8 F (36.6 C)  TempSrc: Oral Oral Oral Oral  SpO2: 98% 97% 96% 97%  Weight:      Height:        GEN- The patient is well appearing, alert and oriented x 3 today, though with slow and thoughtful answers.   Head- normocephalic, atraumatic Eyes-  Sclera clear Ears- hearing intact Neck- supple Lungs- normal WOB Heart- telemetry and echo reviewed GI- not examined Extremities- no clubbing, cyanosis, or edema MS- no significant deformity or atrophy Skin- binder is in place around thorax Psych- difficult to assess completely though patient is very pleasant and cooperative   Labs:   Lab Results  Component Value Date   WBC 12.8 (H) 05/15/2019   HGB 14.7 05/15/2019   HCT 43.6 05/15/2019   MCV 94.0 05/15/2019   PLT 191 05/15/2019    Recent Labs  Lab 05/15/19 0910  NA 133*  K 3.9  CL 95*  CO2 20*  BUN 16  CREATININE 0.69  CALCIUM 9.2  PROT 7.2  BILITOT 1.2  ALKPHOS 58  ALT 16  AST 20  GLUCOSE 159*   Lab Results  Component Value Date   TROPONINI <0.03 05/15/2019   Lab Results  Component Value Date   CHOL 215 (H) 05/17/2019   Lab Results  Component Value Date   HDL 46 05/17/2019   Lab Results  Component Value Date   LDLCALC 139 (H) 05/17/2019   Lab Results  Component Value Date   TRIG 148 05/17/2019   Lab Results  Component Value Date   CHOLHDL 4.7 05/17/2019   No results found for: LDLDIRECT  No results found for:  DDIMER   Radiology/Studies:   Ct Angio Head W Or Wo  Contrast Result Date: 05/16/2019 CLINICAL DATA:  Follow-up multiple posterior circulation infarctions. EXAM: CT ANGIOGRAPHY HEAD AND NECK TECHNIQUE: Multidetector CT imaging of the head and neck was performed using the standard protocol during bolus administration of intravenous contrast. Multiplanar CT image reconstructions and MIPs were obtained to evaluate the vascular anatomy. Carotid stenosis measurements (when applicable) are obtained utilizing NASCET criteria, using the distal internal carotid diameter as the denominator. CONTRAST:  111mL OMNIPAQUE IOHEXOL 350 MG/ML SOLN COMPARISON:  CT yesterday.  MRI studies today. FINDINGS: CT HEAD FINDINGS Brain: Discrete low-density redemonstrated in the areas of acute posterior circulation infarction including both cerebellar hemispheres more extensive on the left than the right, both occipital lobes left more than right. Small foci of acute infarction seen within the left thalamus, left splenium of the corpus callosum and left tectum are not specifically appreciated by CT. Old small vessel thalamic infarctions are present. Old small vessel basal ganglia infarctions are present. Old right frontal cortical and subcortical infarction. Old small left parietal vertex infarction. No evidence of interval hemorrhage. Cerebellar swelling causes some mass-effect upon the fourth ventricle but there is no sign of ventricular obstruction at this time. Vascular: There is atherosclerotic calcification of the major vessels at the base of the brain. Skull: Negative Sinuses: Clear Orbits: Negative CTA NECK FINDINGS Aortic arch: Aortic atherosclerosis. No aneurysm or dissection. Branching pattern is normal. No flow limiting origin stenosis. Right carotid system: Common carotid artery shows some atherosclerotic plaque but is widely patent to the bifurcation. At the carotid bifurcation, there is soft and calcified plaque. Minimal diameter of the proximal ICA is 3.5 mm. Compared to a more  distal cervical ICA diameter of 5 mm, this indicates a 30% stenosis. Left carotid system: Common carotid artery shows some scattered plaque but is widely patent to the bifurcation region. There is calcified plaque at the carotid bifurcation and ICA bulb but no stenosis relative to the more distal cervical ICA. Vertebral arteries: Both subclavian arteries are sufficiently patent. There is atherosclerotic plaque at the left vertebral artery origin with stenosis of 50%. Beyond that, the vessel is tortuous and shows atherosclerotic plaque but no stenosis greater than 30%. No evidence of left vertebral dissection or occlusion. Right vertebral artery is the larger vessel. No origin stenosis. The vessel is tortuous proximally and shows some areas of atherosclerosis but no stenosis greater than 30% suspected. In the lower cervical region, the vessel is quite tortuous but likely sufficiently patent. Skeleton: Chronic spinal curvature, degenerative spondylosis and facet osteoarthritis. Other neck: No mass or lymphadenopathy. Upper chest: Bilateral pleural and parenchymal scarring. Asymmetric density in the right upper lobe laterally measuring up to 1.4 cm should probably be evaluated by complete chest CT. There is air within the left chest wall, etiology uncertain. I do not see a pneumothorax on the left. I do not see fracture of the upper left ribs. Review of the MIP images confirms the above findings CTA HEAD FINDINGS Anterior circulation: Both internal carotid arteries are patent through the skull base and siphon regions. There is atherosclerotic calcification throughout both carotid siphon regions with stenosis estimated at 50% on each side. The anterior and middle cerebral vessels are patent without proximal stenosis, aneurysm or vascular malformation. Posterior circulation: Both vertebral arteries are patent at the foramen magnum. There is mild atherosclerotic change in both V4 segments but no stenosis greater than 20%.  Right posterior inferior cerebellar artery appears to show  flow. Left PICA is probably occluded. Both vertebral arteries reach the basilar. In the distal basilar, there appears to be a small filling defect anteriorly consistent with an embolus. Flow is present in both superior cerebellar arteries and both posterior cerebral arteries presently. Venous sinuses: Patent and normal. Anatomic variants: None significant Delayed phase: Not performed IMPRESSION: Aortic atherosclerosis. Atherosclerosis of the brachiocephalic vessel origins without flow limiting stenosis. Atherosclerotic disease at both carotid bifurcations. 30% stenosis on the right. No stenosis on the left. Atherosclerotic disease in both carotid siphon regions with stenosis estimated at 50% on each side. Intracranial anterior circulation branch vessels are patent without correctable proximal stenosis. No occluded large or medium vessels are seen in the anterior circulation. Atherosclerotic disease at both vertebral artery origins and the proximal 2 cm. 50% stenosis of the left vertebral origin. Both proximal vertebral arteries show tortuosity and slight irregularity but without flow limiting stenosis. Stenosis in those regions may approach 30%. Both vertebral arteries are patent to the basilar. Minimal atherosclerosis in both V4 segments but without stenosis greater than 25%. Small filling defect in the distal basilar artery along the anterior margin probably representing a small embolus or embolus remnant. Flow is present currently within both posterior cerebral arteries, both superior cerebellar arteries, both anterior inferior cerebellar arteries and at least the right posterior inferior cerebellar artery. There may be diminished or absent flow in the left PICA. Air/gas in the left chest wall, etiology unknown. I do not see a left pneumothorax or upper chest fracture. Was there a left subclavian central line attempt? Pleural and parenchymal scarring at  both lung apices. Asymmetric density at the right upper lobe measuring up to 1.4 cm in diameter. Mass lesion not excluded. Complete chest CT would be suggested at some point. Electronically Signed   By: Nelson Chimes M.D.   On: 05/16/2019 15:15     Ct Head Wo Contrast Result Date: 05/15/2019 CLINICAL DATA:  Difficulty communicating EXAM: CT HEAD WITHOUT CONTRAST TECHNIQUE: Contiguous axial images were obtained from the base of the skull through the vertex without intravenous contrast. COMPARISON:  None. FINDINGS: Brain: There are multifocal areas of decreased attenuation consistent with acute infarction involving the superior half of the cerebellum on the left and centrally within the cerebellum on the right as well as an area within the left occipital lobe. Encephalomalacia is noted in the right frontal lobe consistent with prior ischemia. Generalized atrophy and chronic white matter ischemic changes are noted as well. Vascular: No hyperdense vessel or unexpected calcification. Skull: Normal. Negative for fracture or focal lesion. Sinuses/Orbits: No acute finding. Other: None. 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 MRI is recommended for further evaluation. 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 Francine Graven , who verbally acknowledged these results. Electronically Signed   By: Inez Catalina M.D.   On: 05/15/2019 13:17    Ct Chest Wo Contrast Result Date: 05/17/2019 CLINICAL DATA:  Shortness of breath. Lethargy. Asymmetric left upper lobe density on neck CTA. History of breast cancer. EXAM: CT CHEST WITHOUT CONTRAST TECHNIQUE: Multidetector CT imaging of the chest was performed following the standard protocol without IV contrast. COMPARISON:  Head and neck CTA 05/16/2019. Chest radiograph 05/15/2019. FINDINGS: Cardiovascular:  Aortic and coronary artery atherosclerosis. 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 Normal caliber of the  ascending aorta. Normal heart size. No pericardial effusion. Mediastinum/Nodes: Recent left mastectomy with postoperative gas in the left chest wall and a drain in place without evidence of a fluid collection. No enlarged axillary, mediastinal, or hilar lymph nodes. Unremarkable thyroid. Residual barium throughout collapsed esophagus. Small sliding hiatal hernia. Lungs/Pleura: Trace left pleural effusion with atelectasis in the basilar left lower lobe. Biapical pleuroparenchymal scarring. 1.4 cm asymmetric density in the right upper lobe described on neck CTA has a curvilinear configuration on axial and sagittal images most suggestive of scarring. There is an approximately 2.5 cm opacity in the right lower lobe which appears partially ground glass/subsolid, however assessment is limited by prominent motion artifact through this region. Additional scattered subcentimeter ground glass and solid nodules are present in both lungs. There is mild peribronchial thickening. Upper Abdomen: Residual barium in the stomach and small bowel. Aortic atherosclerosis. Musculoskeletal: No acute osseous abnormality or suspicious osseous lesion. IMPRESSION: 1. 2.5 cm opacity in the right lower lobe, possibly infectious/inflammatory though with assessment limited by motion artifact. Additional indeterminate subcentimeter bilateral lung nodules. Follow-up chest CT is recommended in 3 months. 2. Trace left pleural effusion with left lower lobe atelectasis. 3. Biapical lung scarring. 4. Recent left mastectomy with postoperative gas and drain in place. No fluid collection. 5. 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 Recommend annual imaging followup by CTA or MRA. This recommendation follows 2010 ACCF/AHA/AATS/ACR/ASA/SCA/SCAI/SIR/STS/SVM  Guidelines for the Diagnosis and Management of Patients with Thoracic Aortic Disease. Circulation. 2010; 121: U765-Y650. Aortic Atherosclerosis (ICD10-I70.0). Aortic aneurysm NOS (ICD10-I71.9). Electronically Signed   By: Logan Bores M.D.   On: 05/17/2019 10:58     Mr Brain Wo Contrast Result Date: 05/16/2019 CLINICAL DATA:  Stroke.  Recent mastectomy 05/09/2019 EXAM: MRI HEAD WITHOUT CONTRAST MRA HEAD WITHOUT CONTRAST TECHNIQUE: Multiplanar, multiecho pulse sequences of the brain and surrounding structures were obtained without intravenous contrast. Angiographic images of the head were obtained using MRA technique without contrast. COMPARISON:  CT head 05/15/2019 FINDINGS: MRI HEAD FINDINGS Brain: Multiple areas of acute infarction in the posterior circulation. Relatively large superior cerebellar infarct on the left. Moderate right superior cerebellar infarct. Acute infarcts in the occipital lobe bilaterally left greater than right. Small acute infarcts in the left thalamus. Small area of acute infarct in the left tectum. Generalized right atrophy frontal lobe. Chronic microvascular ischemic change in the white matter bilaterally. Negative for hemorrhage or mass. No hydrocephalus identified. Vascular: Normal arterial flow voids. In particular, normal flow voids in both vertebral arteries and the basilar artery and both carotid arteries. Skull and upper cervical spine: Negative Sinuses/Orbits: Paranasal sinuses clear. Bilateral cataract surgery. Other: None MRA HEAD FINDINGS Both vertebral arteries widely patent. Basilar widely patent. PICA not visualized. Small left AICA patent. Superior cerebellar arteries not visualized. Both posterior cerebral arteries are patent. Moderate stenosis left P1 segment. Mild stenosis distal posterior cerebral artery bilaterally. Cavernous carotid artery patent bilaterally with atherosclerotic irregularity. Moderate stenosis of the cavernous carotid on the left and mild stenosis  supraclinoid internal carotid artery on the right. Both anterior cerebral arteries patent. M1 segments patent bilaterally. Moderate to severe disease in the M2 branches bilaterally. Negative for aneurysm. IMPRESSION: 1. Multiple large areas of acute infarction in the posterior circulation involving the cerebellum bilaterally, occipital lobes bilaterally, left thalamus, and left tectum. No hemorrhage. Findings may be due to posterior circulation emboli. Suggest CTA head and neck for further evaluation of the arterial circulation. 2. Diffuse intracranial atherosclerotic disease. Electronically  Signed   By: Franchot Gallo M.D.   On: 05/16/2019 08:41     Vas [place] Lower Extremity Venous (dvt) Result Date: 05/17/2019  Lower Venous Study Indications: Swelling, Pain, and stroke.  Limitations: Patient positioning. Performing Technologist: Abram Sander RVS  Examination Guidelines: A complete evaluation includes B-mode imaging, spectral Doppler, color Doppler, and power Doppler as needed of all accessible portions of each vessel. Bilateral testing is considered an integral part of a complete examination. Limited examinations for reoccurring indications may be performed as noted. Summary: Right: There is no evidence of deep vein thrombosis in the lower extremity. No cystic structure found in the popliteal fossa. Left: There is no evidence of deep vein thrombosis in the lower extremity. No cystic structure found in the popliteal fossa.  *See table(s) above for measurements and observations. Electronically signed by Curt Jews MD on 05/17/2019 at 5:01:36 PM.    Final     12-lead ECG SR All prior EKG's in EPIC reviewed with no documented atrial fibrillation  Telemetry SR, rare PACs PVCs  Assessment and Plan:  1. Cryptogenic stroke The patient presents with cryptogenic stroke.  I spoke at length with the patient and as well with h er daughter Jessica Harrell via telephone about monitoring for afib with either a 30 day event  monitor or an implantable loop recorder.  Risks, benefits, and alteratives to implantable loop recorder were discussed with the patient today.     The patient is s/p mastectomy with some degree of dehiscence of this wound, her drain removed this AM by surgical services.  Dr. Curt Bears has seen the patient, with even minimal amount of dehiscence of her mastectomy wound, feel it is better to allow complete healing of this first.  Did not feel that a right sided loop implant would get good enough signals for accurate monitoring.  This was discussed with the patient and her daughter Jessica Harrell.  She feels better about holding off as well.   EP follow up is in place Please call with questions.   Baldwin [place], PA-C 05/19/2019  I have seen and examined this patient with Tommye Standard.  Agree with above, note added to reflect my findings.  On exam, RRR, no murmurs, lungs clear.  Patient presented to the hospital with cryptogenic stroke. To date, no cause has been found. TEE planned for today. If unrevealing, Aldon Hengst plan for LINQ monitor to look for atrial fibrillation. Risks and benefits discussed. Risks include but not limited to bleeding and infection. The patient understands the risks and has agreed to the procedure.  Unfortunately, the patient had her stroke after having had surgery from breast cancer.  She is status post left mastectomy.  Her incision is still healing.  At this point, we Amri Lien hold off on Linq insertion until her wound has healed.  She is going to rehab.    Margaretmary Prisk M. Essence Merle MD 05/19/2019 11:18 AM

## 2019-05-19 NOTE — TOC Progression Note (Signed)
Transition of Care [facility]) - Progression Note    Patient Details  Name: Jessica Harrell MRN: [id number] Date of Birth: [birth year]  [facility] CM/SW [place], [place] Phone Number: 05/19/2019, 4:36 PM  Clinical Narrative:   CSW following for discharge plan. CSW spoke with patient's daughter this morning and the family has chosen [facility] in [place]. CSW confirmed bed availability and they have initiated insurance authorization. CSW later contacted by Admissions that patient will need a new COVID test as they are requiring negative test within the past 3 days. CSW alerted MD. Patient continues to await insurance authorization and a negative COVID Test to admit to SNF.    Expected Discharge Plan: [place] Barriers to Discharge: Continued Medical Work up, Ship broker  Expected Discharge Plan and Services Expected Discharge Plan: [place] Choice: [place] arrangements for the past 2 months: Single Family Home Expected Discharge Date: 05/19/19                                     Social Determinants of Health (SDOH) Interventions    Readmission Risk Interventions No flowsheet data found.

## 2019-05-19 NOTE — Progress Notes (Signed)
Physical Therapy Evaluation Patient Details Name: Jessica Harrell MRN: [id number] DOB: [birth year] Today's Date: 05/19/2019   History of Present Illness  Patient is a 78 year old female admitted with acute CVA of bilateral cerebellum, L thalamus and left tectum. She is S/P bilateral mastectomy on 05/09/2019. She had gone home for 2 days before coming back to the hospital with stroke like symptoms. PMH: breast cancer, right foot pain;   Clinical Impression  Patient was able to tolerate more transfers but she is still having significant difficulty with gait technique. She is unable to take steps without throwing off her balance. She fatigued with treatment but was able to stand 3x and also sit at the edge of the bed to perform ADL's with OT. She would benefit from further rehab at a SNF.     Follow Up Recommendations CIR;SNF    Equipment Recommendations  Rolling walker with 5" wheels    Recommendations for Other Services Rehab consult     Precautions / Restrictions Restrictions Weight Bearing Restrictions: No      Mobility  Bed Mobility Overal bed mobility: Needs Assistance Bed Mobility: Supine to Sit;Sit to Supine     Supine to sit: Min guard Sit to supine: Min guard   General bed mobility comments: continues to sit up with min gaurd for intial balance. Rquired intermittent assist to remain sitting. fell to the left today. Worse balance when perfroming ADL's   Transfers Overall transfer level: Needs assistance Equipment used: Rolling walker (2 wheeled) Transfers: Sit to/from Stand Sit to Stand: Min guard;Mod assist;+2 physical assistance         General transfer comment: Patient rtransfered sit to stand 3x with therapy. As she fatigued she required more assist. on the last trial she was not able to get her balanace forward enough to stand. She reported fatigue.   Ambulation/Gait Ambulation/Gait assistance: Mod assist Gait Distance (Feet): 4 Feet(1' 3x 1' side step to  the right ) Assistive device: Rolling walker (2 wheeled) Gait Pattern/deviations: Ataxic Gait velocity: decreased Gait velocity interpretation: <1.31 ft/sec, indicative of household ambulator General Gait Details: Mod a for hand placement and balance. Patient lower body moves with gait but her upper body stays in the same spot throwing her off balance. She was unable to correct despite assist and max verbal and tactile cuing.   Stairs            Wheelchair Mobility    Modified Rankin (Stroke Patients Only) Modified Rankin (Stroke Patients Only) Pre-Morbid Rankin Score: Moderate disability Modified Rankin: Severe disability     Balance Overall balance assessment: Needs assistance Sitting-balance support: Bilateral upper extremity supported;Feet supported Sitting balance-Leahy Scale: Fair Sitting balance - Comments: more assist required today to keep her from falling to the left    Standing balance support: Bilateral upper extremity supported Standing balance-Leahy Scale: Poor Standing balance comment: max a to remain balanced                              Pertinent Vitals/Pain Pain Assessment: No/denies pain    Home Living                        Prior Function                 Hand Dominance        Extremity/Trunk Assessment  Communication      Cognition Arousal/Alertness: Awake/alert Behavior During Therapy: Impulsive;Flat affect Overall Cognitive Status: Impaired/Different from baseline Area of Impairment: Orientation;Memory;Attention;Following commands;Safety/judgement;Awareness;Problem solving                 Orientation Level: Place;Time;Situation Current Attention Level: Focused Memory: Decreased short-term memory Following Commands: Follows one step commands with increased time Safety/Judgement: Decreased awareness of safety;Decreased awareness of deficits Awareness: Intellectual Problem Solving:  Slow processing;Decreased initiation;Difficulty sequencing;Requires verbal cues;Requires tactile cues General Comments: Patient is very pleasent but confused       General Comments      Exercises     Assessment/Plan    PT Assessment    PT Problem List         PT Treatment Interventions      PT Goals (Current goals can be found in the Care Plan section)  Acute Rehab PT Goals Patient Stated Goal: unable to state  PT Goal Formulation: With patient    Frequency Min 4X/week   Barriers to discharge        Co-evaluation PT/OT/SLP Co-Evaluation/Treatment: Yes Reason for Co-Treatment: Complexity of the patient's impairments (multi-system involvement);Necessary to address cognition/behavior during functional activity;For patient/therapist safety;To address functional/ADL transfers PT goals addressed during session: Mobility/safety with mobility;Balance;Proper use of DME;Strengthening/ROM         AM-PAC PT "6 Clicks" Mobility  Outcome Measure Help needed turning from your back to your side while in a flat bed without using bedrails?: A Little Help needed moving from lying on your back to sitting on the side of a flat bed without using bedrails?: A Little Help needed moving to and from a bed to a chair (including a wheelchair)?: A Lot Help needed standing up from a chair using your arms (e.g., wheelchair or bedside chair)?: A Lot Help needed to walk in hospital room?: Total Help needed climbing 3-5 steps with a railing? : Total 6 Click Score: 12    End of Session Equipment Utilized During Treatment: Gait belt Activity Tolerance: Patient limited by fatigue Patient left: in bed;with call bell/phone within reach;with chair alarm set Nurse Communication: Mobility status PT Visit Diagnosis: Other abnormalities of gait and mobility (R26.89);Muscle weakness (generalized) (M62.81);Difficulty in walking, not elsewhere classified (R26.2)    Time: 1000-1023 PT Time Calculation (min)  (ACUTE ONLY): 23 min   Charges:     PT Treatments $Therapeutic Activity: 8-22 mins        Carney Living PT DPT  05/19/2019, 1:00 PM

## 2019-05-19 NOTE — Progress Notes (Signed)
Patient ID: Jessica Harrell, female   DOB: 08/08/1941, 78 y.o.   MRN: [id number]   Drain output minimal. Drain removed at bedside from left mastectomy site Some skin sloughing at incision but flaps viable  Will check again on Monday

## 2019-05-19 NOTE — Discharge Summary (Addendum)
Physician Discharge Summary  Jessica Harrell KZL:[id number] DOB: Nov 08, 1941 DOA: 05/15/2019  PCP: [name], MD  Admit date: 05/15/2019 Discharge date 05/20/19 Admitted From: home  Disposition:  SNF   Recommendations for Outpatient Follow-up:  1. Follow up with PCP in 1-2 weeks. 2.   Follow up with neurology as referred. 3.   Follow up with Oncology as scheduled.  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  In the emergency department, patient was afebrile, hemodynamically stable. WBC 12.8.  Lactic acid 4.4.  Troponin negative.  EKG sinus rhythm.  CT scan of the brain showed multifocal infarcts in the bilateral cerebellum and left occipital lobe.  MRI showed multifocal stroke and transferred to [facility].  Seen and followed by neurology. 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  Discharge Diagnoses:  Active Problems:   Acute ischemic stroke (HCC)   Acute metabolic encephalopathy  Acute multifocal stroke: In a postop patient.  With bilateral cortical blindness. -Probable embolic stroke in a patient with malignancy and recent surgery. -MRI brain resulted shows multifocal stroke.  MRA of the brain with no large vessel occlusion. -2D echocardiogram essentially normal.  TEE showed PFO with bilateral flow, lower extremity duplexes negative for DVT.  Loop recorder planned for today. -CTA head and neck did not demonstrate any dissection or embolism.   -Currently on aspirin, she will continue. -Appreciate neurology input. -Continue to work with PT OT and speech.  She  will be started on mechanical soft diet and nectar thick liquid. -Hemoglobin A1c 5.6 -LDL 139 Plan is to discharge patient on aspirin, rosuvastatin(allergy to atorvastatin, she has tolerated Crestor) Follow-up with neurology as outpatient All-time fall precautions because of blindness Loop recorder postponed due to chest wall wound , Epi will follow up.   COVID NEGATIVE  Acute metabolic encephalopathy: Due to acute stroke.  Continue to monitor.  Patient had lactic acidosis but no evidence of infection.    Negative for infection.  Left breast wound dehiscence: Status post mastectomy last week.  Seen by surgery.  Suggested dry dressing.  Drains were removed.  Continue dry dressing.  We will follow-up with surgery.  Smoker: Counseled to quit.  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  Currently on letrozole.  Status post mastectomy.    Patient will have heme-onc follow-up. A CT scan of the chest showed possible apical scar.  I have updated her oncologist for follow-up.   Patient is fairly stable to transfer to a skilled level of care to continue to work with PT OT.  Discharge Instructions  Discharge Instructions    Ambulatory referral to Neurology   Complete by: As directed    An appointment is requested in approximately: 4 weeks   Diet general   Complete by: As directed    Dysphagia 3 diet with nectar thick liquid   Increase activity slowly   Complete by: As directed      Allergies as of 05/19/2019      Reactions   Atorvastatin Other (See Comments)   Muscle pain       Medication List    STOP taking these medications   RED YEAST RICE  PO   traMADol 50 MG tablet Commonly known as: ULTRAM     TAKE these medications   aspirin EC 81 MG tablet Take 81 mg by mouth daily.   cholecalciferol 1000 units tablet Commonly known as: VITAMIN D Take 1,000 Units by mouth daily.   letrozole 2.5 MG tablet Commonly known as: FEMARA TAKE 1  TABLET ONCE DAILY.   rosuvastatin 10 MG tablet Commonly known as: CRESTOR Take 1 tablet (10 mg total) by mouth daily at 6 PM.       Allergies  Allergen Reactions  . Atorvastatin Other (See Comments)    Muscle pain     Consultations:  Neurology  Cardiology for TEE and loop recorder   Procedures/Studies: Ct Angio Head W Or Wo Contrast  Result Date: 05/16/2019 CLINICAL DATA:  Follow-up multiple posterior circulation infarctions. EXAM: CT ANGIOGRAPHY HEAD AND NECK TECHNIQUE: Multidetector CT imaging of the head and neck was performed using the standard protocol during bolus administration of intravenous contrast. Multiplanar CT image reconstructions and MIPs were obtained to evaluate the vascular anatomy. Carotid stenosis measurements (when applicable) are obtained utilizing NASCET criteria, using the distal internal carotid diameter as the denominator. CONTRAST:  135mL OMNIPAQUE IOHEXOL 350 MG/ML SOLN COMPARISON:  CT yesterday.  MRI studies today. FINDINGS: CT HEAD FINDINGS Brain: Discrete low-density redemonstrated in the areas of acute posterior circulation infarction including both cerebellar hemispheres more extensive on the left than the right, both occipital lobes left more than right. Small foci of acute infarction seen within the left thalamus, left splenium of the corpus callosum and left tectum are not specifically appreciated by CT. Old small vessel thalamic infarctions are present. Old small vessel basal ganglia infarctions are present. Old right frontal cortical and subcortical infarction. Old small left parietal vertex infarction. No evidence of interval hemorrhage. Cerebellar swelling causes some mass-effect upon the fourth ventricle but there is no sign of ventricular obstruction at this time. Vascular: There is atherosclerotic calcification of the major vessels at the base of the brain. Skull: Negative Sinuses: Clear Orbits: Negative CTA NECK FINDINGS Aortic arch: Aortic  atherosclerosis. No aneurysm or dissection. Branching pattern is normal. No flow limiting origin stenosis. Right carotid system: Common carotid artery shows some atherosclerotic plaque but is widely patent to the bifurcation. At the carotid bifurcation, there is soft and calcified plaque. Minimal diameter of the proximal ICA is 3.5 mm. Compared to a more distal cervical ICA diameter of 5 mm, this indicates a 30% stenosis. Left carotid system: Common carotid artery shows some scattered plaque but is widely patent to the bifurcation region. There is calcified plaque at the carotid bifurcation and ICA bulb but no stenosis relative to the more distal cervical ICA. Vertebral arteries: Both subclavian arteries are sufficiently patent. There is atherosclerotic plaque at the left vertebral artery origin with stenosis of 50%. Beyond that, the vessel is tortuous and shows atherosclerotic plaque but no stenosis greater than 30%. No evidence of left vertebral dissection or occlusion. Right vertebral artery is the larger vessel. No origin stenosis. The vessel is tortuous proximally and shows some areas of atherosclerosis but no stenosis greater than 30% suspected. In the lower cervical region, the vessel is quite tortuous but likely sufficiently patent. Skeleton: Chronic spinal curvature, degenerative spondylosis and facet osteoarthritis. Other neck: No mass or lymphadenopathy. Upper chest: Bilateral pleural and parenchymal scarring. Asymmetric density in the right upper lobe laterally measuring up to 1.4 cm should probably be evaluated by complete chest CT. There is air within the  left chest wall, etiology uncertain. I do not see a pneumothorax on the left. I do not see fracture of the upper left ribs. Review of the MIP images confirms the above findings CTA HEAD FINDINGS Anterior circulation: Both internal carotid arteries are patent through the skull base and siphon regions. There is atherosclerotic calcification throughout  both carotid siphon regions with stenosis estimated at 50% on each side. The anterior and middle cerebral vessels are patent without proximal stenosis, aneurysm or vascular malformation. Posterior circulation: Both vertebral arteries are patent at the foramen magnum. There is mild atherosclerotic change in both V4 segments but no stenosis greater than 20%. Right posterior inferior cerebellar artery appears to show flow. Left PICA is probably occluded. Both vertebral arteries reach the basilar. In the distal basilar, there appears to be a small filling defect anteriorly consistent with an embolus. Flow is present in both superior cerebellar arteries and both posterior cerebral arteries presently. Venous sinuses: Patent and normal. Anatomic variants: None significant Delayed phase: Not performed IMPRESSION: Aortic atherosclerosis. Atherosclerosis of the brachiocephalic vessel origins without flow limiting stenosis. Atherosclerotic disease at both carotid bifurcations. 30% stenosis on the right. No stenosis on the left. Atherosclerotic disease in both carotid siphon regions with stenosis estimated at 50% on each side. Intracranial anterior circulation branch vessels are patent without correctable proximal stenosis. No occluded large or medium vessels are seen in the anterior circulation. Atherosclerotic disease at both vertebral artery origins and the proximal 2 cm. 50% stenosis of the left vertebral origin. Both proximal vertebral arteries show tortuosity and slight irregularity but without flow limiting stenosis. Stenosis in those regions may approach 30%. Both vertebral arteries are patent to the basilar. Minimal atherosclerosis in both V4 segments but without stenosis greater than 25%. Small filling defect in the distal basilar artery along the anterior margin probably representing a small embolus or embolus remnant. Flow is present currently within both posterior cerebral arteries, both superior cerebellar  arteries, both anterior inferior cerebellar arteries and at least the right posterior inferior cerebellar artery. There may be diminished or absent flow in the left PICA. Air/gas in the left chest wall, etiology unknown. I do not see a left pneumothorax or upper chest fracture. Was there a left subclavian central line attempt? Pleural and parenchymal scarring at both lung apices. Asymmetric density at the right upper lobe measuring up to 1.4 cm in diameter. Mass lesion not excluded. Complete chest CT would be suggested at some point. Electronically Signed   By: Nelson Chimes M.D.   On: 05/16/2019 15:15   Ct Head Wo Contrast  Result Date: 05/15/2019 CLINICAL DATA:  Difficulty communicating EXAM: CT HEAD WITHOUT CONTRAST TECHNIQUE: Contiguous axial images were obtained from the base of the skull through the vertex without intravenous contrast. COMPARISON:  None. FINDINGS: Brain: There are multifocal areas of decreased attenuation consistent with acute infarction involving the superior half of the cerebellum on the left and centrally within the cerebellum on the right as well as an area within the left occipital lobe. Encephalomalacia is noted in the right frontal lobe consistent with prior ischemia. Generalized atrophy and chronic white matter ischemic changes are noted as well. Vascular: No hyperdense vessel or unexpected calcification. Skull: Normal. Negative for fracture or focal lesion. Sinuses/Orbits: No acute finding. Other: None. 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 MRI is recommended for further evaluation. Old infarct in the right frontal lobe Critical Value/emergent results were called by telephone at the time  of interpretation on 05/15/2019 at 1:17 pm to Dr. Francine Graven , who verbally acknowledged these results. Electronically Signed   By: Inez Catalina M.D.   On: 05/15/2019 13:17   Ct Angio Neck W Or Wo Contrast  Result Date:  05/16/2019 CLINICAL DATA:  Follow-up multiple posterior circulation infarctions. EXAM: CT ANGIOGRAPHY HEAD AND NECK TECHNIQUE: Multidetector CT imaging of the head and neck was performed using the standard protocol during bolus administration of intravenous contrast. Multiplanar CT image reconstructions and MIPs were obtained to evaluate the vascular anatomy. Carotid stenosis measurements (when applicable) are obtained utilizing NASCET criteria, using the distal internal carotid diameter as the denominator. CONTRAST:  174mL OMNIPAQUE IOHEXOL 350 MG/ML SOLN COMPARISON:  CT yesterday.  MRI studies today. FINDINGS: CT HEAD FINDINGS Brain: Discrete low-density redemonstrated in the areas of acute posterior circulation infarction including both cerebellar hemispheres more extensive on the left than the right, both occipital lobes left more than right. Small foci of acute infarction seen within the left thalamus, left splenium of the corpus callosum and left tectum are not specifically appreciated by CT. Old small vessel thalamic infarctions are present. Old small vessel basal ganglia infarctions are present. Old right frontal cortical and subcortical infarction. Old small left parietal vertex infarction. No evidence of interval hemorrhage. Cerebellar swelling causes some mass-effect upon the fourth ventricle but there is no sign of ventricular obstruction at this time. Vascular: There is atherosclerotic calcification of the major vessels at the base of the brain. Skull: Negative Sinuses: Clear Orbits: Negative CTA NECK FINDINGS Aortic arch: Aortic atherosclerosis. No aneurysm or dissection. Branching pattern is normal. No flow limiting origin stenosis. Right carotid system: Common carotid artery shows some atherosclerotic plaque but is widely patent to the bifurcation. At the carotid bifurcation, there is soft and calcified plaque. Minimal diameter of the proximal ICA is 3.5 mm. Compared to a more distal cervical ICA  diameter of 5 mm, this indicates a 30% stenosis. Left carotid system: Common carotid artery shows some scattered plaque but is widely patent to the bifurcation region. There is calcified plaque at the carotid bifurcation and ICA bulb but no stenosis relative to the more distal cervical ICA. Vertebral arteries: Both subclavian arteries are sufficiently patent. There is atherosclerotic plaque at the left vertebral artery origin with stenosis of 50%. Beyond that, the vessel is tortuous and shows atherosclerotic plaque but no stenosis greater than 30%. No evidence of left vertebral dissection or occlusion. Right vertebral artery is the larger vessel. No origin stenosis. The vessel is tortuous proximally and shows some areas of atherosclerosis but no stenosis greater than 30% suspected. In the lower cervical region, the vessel is quite tortuous but likely sufficiently patent. Skeleton: Chronic spinal curvature, degenerative spondylosis and facet osteoarthritis. Other neck: No mass or lymphadenopathy. Upper chest: Bilateral pleural and parenchymal scarring. Asymmetric density in the right upper lobe laterally measuring up to 1.4 cm should probably be evaluated by complete chest CT. There is air within the left chest wall, etiology uncertain. I do not see a pneumothorax on the left. I do not see fracture of the upper left ribs. Review of the MIP images confirms the above findings CTA HEAD FINDINGS Anterior circulation: Both internal carotid arteries are patent through the skull base and siphon regions. There is atherosclerotic calcification throughout both carotid siphon regions with stenosis estimated at 50% on each side. The anterior and middle cerebral vessels are patent without proximal stenosis, aneurysm or vascular malformation. Posterior circulation: Both vertebral arteries are patent  at the foramen magnum. There is mild atherosclerotic change in both V4 segments but no stenosis greater than 20%. Right posterior  inferior cerebellar artery appears to show flow. Left PICA is probably occluded. Both vertebral arteries reach the basilar. In the distal basilar, there appears to be a small filling defect anteriorly consistent with an embolus. Flow is present in both superior cerebellar arteries and both posterior cerebral arteries presently. Venous sinuses: Patent and normal. Anatomic variants: None significant Delayed phase: Not performed IMPRESSION: Aortic atherosclerosis. Atherosclerosis of the brachiocephalic vessel origins without flow limiting stenosis. Atherosclerotic disease at both carotid bifurcations. 30% stenosis on the right. No stenosis on the left. Atherosclerotic disease in both carotid siphon regions with stenosis estimated at 50% on each side. Intracranial anterior circulation branch vessels are patent without correctable proximal stenosis. No occluded large or medium vessels are seen in the anterior circulation. Atherosclerotic disease at both vertebral artery origins and the proximal 2 cm. 50% stenosis of the left vertebral origin. Both proximal vertebral arteries show tortuosity and slight irregularity but without flow limiting stenosis. Stenosis in those regions may approach 30%. Both vertebral arteries are patent to the basilar. Minimal atherosclerosis in both V4 segments but without stenosis greater than 25%. Small filling defect in the distal basilar artery along the anterior margin probably representing a small embolus or embolus remnant. Flow is present currently within both posterior cerebral arteries, both superior cerebellar arteries, both anterior inferior cerebellar arteries and at least the right posterior inferior cerebellar artery. There may be diminished or absent flow in the left PICA. Air/gas in the left chest wall, etiology unknown. I do not see a left pneumothorax or upper chest fracture. Was there a left subclavian central line attempt? Pleural and parenchymal scarring at both lung apices.  Asymmetric density at the right upper lobe measuring up to 1.4 cm in diameter. Mass lesion not excluded. Complete chest CT would be suggested at some point. Electronically Signed   By: Nelson Chimes M.D.   On: 05/16/2019 15:15   Ct Chest Wo Contrast  Result Date: 05/17/2019 CLINICAL DATA:  Shortness of breath. Lethargy. Asymmetric left upper lobe density on neck CTA. History of breast cancer. EXAM: CT CHEST WITHOUT CONTRAST TECHNIQUE: Multidetector CT imaging of the chest was performed following the standard protocol without IV contrast. COMPARISON:  Head and neck CTA 05/16/2019. Chest radiograph 05/15/2019. FINDINGS: Cardiovascular: Aortic and coronary artery atherosclerosis. 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 Normal caliber of the ascending aorta. Normal heart size. No pericardial effusion. Mediastinum/Nodes: Recent left mastectomy with postoperative gas in the left chest wall and a drain in place without evidence of a fluid collection. No enlarged axillary, mediastinal, or hilar lymph nodes. Unremarkable thyroid. Residual barium throughout collapsed esophagus. Small sliding hiatal hernia. Lungs/Pleura: Trace left pleural effusion with atelectasis in the basilar left lower lobe. Biapical pleuroparenchymal scarring. 1.4 cm asymmetric density in the right upper lobe described on neck CTA has a curvilinear configuration on axial and sagittal images most suggestive of scarring. There is an approximately 2.5 cm opacity in the right lower lobe which appears partially ground glass/subsolid, however assessment is limited by prominent motion artifact through this region. Additional scattered subcentimeter ground glass and solid nodules are present in both lungs. There is mild peribronchial thickening. Upper Abdomen: Residual barium in the stomach and small bowel. Aortic atherosclerosis. Musculoskeletal: No acute osseous abnormality or suspicious osseous lesion.  IMPRESSION: 1. 2.5 cm opacity in the right lower  lobe, possibly infectious/inflammatory though with assessment limited by motion artifact. Additional indeterminate subcentimeter bilateral lung nodules. Follow-up chest CT is recommended in 3 months. 2. Trace left pleural effusion with left lower lobe atelectasis. 3. Biapical lung scarring. 4. Recent left mastectomy with postoperative gas and drain in place. No fluid collection. 5. 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 Recommend annual imaging followup by CTA or MRA. This recommendation follows 2010 ACCF/AHA/AATS/ACR/ASA/SCA/SCAI/SIR/STS/SVM Guidelines for the Diagnosis and Management of Patients with Thoracic Aortic Disease. Circulation. 2010; 121: K240-X735. Aortic Atherosclerosis (ICD10-I70.0). Aortic aneurysm NOS (ICD10-I71.9). Electronically Signed   By: Logan Bores M.D.   On: 05/17/2019 10:58   Mr Brain Wo Contrast  Result Date: 05/16/2019 CLINICAL DATA:  Stroke.  Recent mastectomy 05/09/2019 EXAM: MRI HEAD WITHOUT CONTRAST MRA HEAD WITHOUT CONTRAST TECHNIQUE: Multiplanar, multiecho pulse sequences of the brain and surrounding structures were obtained without intravenous contrast. Angiographic images of the head were obtained using MRA technique without contrast. COMPARISON:  CT head 05/15/2019 FINDINGS: MRI HEAD FINDINGS Brain: Multiple areas of acute infarction in the posterior circulation. Relatively large superior cerebellar infarct on the left. Moderate right superior cerebellar infarct. Acute infarcts in the occipital lobe bilaterally left greater than right. Small acute infarcts in the left thalamus. Small area of acute infarct in the left tectum. Generalized right atrophy frontal lobe. Chronic microvascular ischemic change in the white matter bilaterally. Negative for hemorrhage or mass. No hydrocephalus identified. Vascular: Normal arterial flow voids. In particular, normal flow voids in both  vertebral arteries and the basilar artery and both carotid arteries. Skull and upper cervical spine: Negative Sinuses/Orbits: Paranasal sinuses clear. Bilateral cataract surgery. Other: None MRA HEAD FINDINGS Both vertebral arteries widely patent. Basilar widely patent. PICA not visualized. Small left AICA patent. Superior cerebellar arteries not visualized. Both posterior cerebral arteries are patent. Moderate stenosis left P1 segment. Mild stenosis distal posterior cerebral artery bilaterally. Cavernous carotid artery patent bilaterally with atherosclerotic irregularity. Moderate stenosis of the cavernous carotid on the left and mild stenosis supraclinoid internal carotid artery on the right. Both anterior cerebral arteries patent. M1 segments patent bilaterally. Moderate to severe disease in the M2 branches bilaterally. Negative for aneurysm. IMPRESSION: 1. Multiple large areas of acute infarction in the posterior circulation involving the cerebellum bilaterally, occipital lobes bilaterally, left thalamus, and left tectum. No hemorrhage. Findings may be due to posterior circulation emboli. Suggest CTA head and neck for further evaluation of the arterial circulation. 2. Diffuse intracranial atherosclerotic disease. Electronically Signed   By: Franchot Gallo M.D.   On: 05/16/2019 08:41   Nm Sentinel Node Inj-no Rpt (breast)  Result Date: 05/09/2019 Sulfur colloid was injected by the nuclear medicine technologist for melanoma sentinel node.   Dg Chest Port 1 View  Result Date: 05/15/2019 CLINICAL DATA:  Weakness EXAM: PORTABLE CHEST 1 VIEW COMPARISON:  09/16/2015 FINDINGS: Heart is normal size. Mild peribronchial thickening and interstitial prominence, likely mild bronchitic changes. No effusions or acute bony abnormality. IMPRESSION: Peribronchial thickening and interstitial prominence suggestive of bronchitic changes. Electronically Signed   By: Rolm Baptise M.D.   On: 05/15/2019 09:57   Dg Swallowing  Func-speech Pathology  Result Date: 05/17/2019 Objective Swallowing Evaluation: Type of Study: MBS-Modified Barium Swallow Study  Patient Details Name: Jessica Harrell MRN: [id number] Date of Birth: [birth year] Today's Date: 05/17/2019 Time: SLP Start Time (ACUTE ONLY): 0845 -SLP Stop Time (ACUTE ONLY): 0915 SLP Time Calculation (min) (ACUTE ONLY): 30 min Past Medical History: Past Medical History:  Diagnosis Date . Breast cancer ([place])  . Right foot pain Nov. 2014 Past Surgical History: Past Surgical History: Procedure Laterality Date . BREAST BIOPSY   . CATARACT EXTRACTION    both eyes . EYE SURGERY Left  . MASTECTOMY W/ SENTINEL NODE BIOPSY Left 05/09/2019  Procedure: LEFT MASTECTOMY WITH LEFT AXILLARY SENTINEL LYMPH NODE BIOPSY;  Surgeon: Coralie Keens, MD;  Location: [facility];  Service: General;  Laterality: Left; HPI: Jessica Harrell is an 78 y.o. female with breast CA status post mastectomy last week who was discharged home on Wednesday, did well for 2 days, then became very weak and lethargic on Saturday with confusion and decreased po intake- diagnosed with encephalopathy. She presented to the [facility] ED, where a CT head revealed bilateral cerebellar and left occipital lobe hypoattenuation. She was outside of the window for TPA.  Pt has h/o smoking and reports she is a current smoker.  Speech and swallow evaluation ordered.   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  This results in decreased oral control, premature spillage of boluses into pharynx/larynx and decreased timing of laryngeal closure. GROSS aspiration of thin via cup noted also due pt having difficulty holding her cup - thus taking very large boluses.  Overt cough noted with gross aspiration but this did not clear aspirates.  Laryngeal penetration to vocal cords  present with tsp of thin - TRACE.  Chin tuck posture using straw continued to allow laryngeal penetration of thin to cords.  No aspiration/penetration of nectar, pudding, moist solid or pudding with tablet.  Discoordination/weakness also allows oropharyngeal residuals across consistencies. Cued dry swallow helpful to decrease residue but difficult for pt to perform.  Following solids with nectar helpful.  Please note, upon esophageal sweep after swallowing barium tablet taken with pudding pt appears with residuals throughout with retrograde propulsion without awareness.  Esophagus appears widened - multiple swallows of nectar effective to largely clear esophagus. Suspect some component of dysmotility - radiologist not present to confirm. Recommend strict aspiration precautions for Ms Bettes.  Will follow up for dysphagia management.   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 Frontal lobe and executive function deficit following -- Impact on safety and function Moderate aspiration risk   CHL IP TREATMENT RECOMMENDATION 05/17/2019 Treatment Recommendations Therapy as outlined in treatment plan below   Prognosis 05/17/2019 Prognosis for Safe Diet Advancement Fair Barriers to Reach Goals Other (Comment) Barriers/Prognosis Comment -- CHL IP DIET RECOMMENDATION 05/17/2019 SLP Diet Recommendations Dysphagia 3 (Mech soft) solids;Nectar thick liquid Liquid Administration via -- Medication Administration Whole meds with puree Compensations Slow rate;Small sips/bites;Follow solids with liquid Postural Changes Seated upright at 90 degrees;Remain semi-upright after after feeds/meals (Comment)   CHL IP OTHER RECOMMENDATIONS 05/17/2019 Recommended Consults -- Oral Care Recommendations Oral care before and after PO Other Recommendations --   CHL IP FOLLOW UP RECOMMENDATIONS 05/17/2019 Follow up Recommendations  Skilled Nursing facility   [facility] IP FREQUENCY AND DURATION 05/17/2019 Speech Therapy Frequency (ACUTE ONLY) min 2x/week Treatment Duration 2 weeks      CHL IP ORAL PHASE 05/17/2019 Oral Phase Impaired Oral - Pudding Teaspoon -- Oral - Pudding Cup -- Oral - Honey Teaspoon -- Oral - Honey Cup -- Oral - Nectar Teaspoon -- Oral - Nectar Cup Premature spillage;Decreased bolus cohesion;Weak lingual manipulation Oral - Nectar Straw  Weak lingual manipulation;Decreased bolus cohesion;Premature spillage Oral - Thin Teaspoon Weak lingual manipulation;Premature spillage;Decreased bolus cohesion Oral - Thin Cup Weak lingual manipulation;Premature spillage;Decreased bolus cohesion Oral - Thin Straw Weak lingual manipulation;Premature spillage;Decreased bolus cohesion Oral - Puree Weak lingual manipulation;Piecemeal swallowing;Decreased bolus cohesion Oral - Mech Soft Weak lingual manipulation;Piecemeal swallowing;Decreased bolus cohesion Oral - Regular -- Oral - Multi-Consistency -- Oral - Pill Weak lingual manipulation;Decreased bolus cohesion;Reduced posterior propulsion Oral Phase - Comment --  CHL IP PHARYNGEAL PHASE 05/17/2019 Pharyngeal Phase Impaired Pharyngeal- Pudding Teaspoon -- Pharyngeal -- Pharyngeal- Pudding Cup -- Pharyngeal -- Pharyngeal- Honey Teaspoon -- Pharyngeal -- Pharyngeal- Honey Cup -- Pharyngeal -- Pharyngeal- Nectar Teaspoon NT Pharyngeal -- Pharyngeal- Nectar Cup Pharyngeal residue - valleculae;Reduced tongue base retraction Pharyngeal -- Pharyngeal- Nectar Straw Reduced tongue base retraction;Pharyngeal residue - valleculae;Pharyngeal residue - pyriform Pharyngeal -- Pharyngeal- Thin Teaspoon Penetration/Aspiration during swallow;Reduced epiglottic inversion;Pharyngeal residue - valleculae;Pharyngeal residue - pyriform Pharyngeal Material enters airway, CONTACTS cords and not ejected out Pharyngeal- Thin Cup Penetration/Aspiration during swallow;Significant aspiration (Amount) Pharyngeal Material enters  airway, passes BELOW cords and not ejected out despite cough attempt by patient Pharyngeal- Thin Straw Penetration/Aspiration during swallow;Pharyngeal residue - valleculae;Pharyngeal residue - pyriform Pharyngeal -- Pharyngeal- Puree Reduced epiglottic inversion;Pharyngeal residue - valleculae;Reduced tongue base retraction Pharyngeal -- Pharyngeal- Mechanical Soft Reduced epiglottic inversion;Reduced tongue base retraction;Pharyngeal residue - valleculae Pharyngeal -- Pharyngeal- Regular -- Pharyngeal -- Pharyngeal- Multi-consistency -- Pharyngeal -- Pharyngeal- Pill WFL;Pharyngeal residue - valleculae;Reduced tongue base retraction;Reduced epiglottic inversion Pharyngeal -- Pharyngeal Comment pharyngeal retention mixed with secretions  CHL IP CERVICAL ESOPHAGEAL PHASE 05/17/2019 Cervical Esophageal Phase Impaired Pudding Teaspoon -- Pudding Cup -- Honey Teaspoon -- Honey Cup -- Nectar Teaspoon -- Nectar Cup -- Nectar Straw -- Thin Teaspoon -- Thin Cup -- Thin Straw -- Puree -- Mechanical Soft -- Regular -- Multi-consistency -- Pill -- Cervical Esophageal Comment upon esophageal sweep after barium tablet taken with pudding - pt appears with residuals throughout with retrograde propulsion without awareness, esophagus appears widened - multiple swallows of nectar effective to clear Jessica Salk, MS [facility] SLP Acute Rehab Services Pager [phone] Office [phone] [name] 05/17/2019, 10:21 AM              Mr Jodene Nam Head/brain Wo Cm  Result Date: 05/16/2019 CLINICAL DATA:  Stroke.  Recent mastectomy 05/09/2019 EXAM: MRI HEAD WITHOUT CONTRAST MRA HEAD WITHOUT CONTRAST TECHNIQUE: Multiplanar, multiecho pulse sequences of the brain and surrounding structures were obtained without intravenous contrast. Angiographic images of the head were obtained using MRA technique without contrast. COMPARISON:  CT head 05/15/2019 FINDINGS: MRI HEAD FINDINGS Brain: Multiple areas of acute infarction in the posterior  circulation. Relatively large superior cerebellar infarct on the left. Moderate right superior cerebellar infarct. Acute infarcts in the occipital lobe bilaterally left greater than right. Small acute infarcts in the left thalamus. Small area of acute infarct in the left tectum. Generalized right atrophy frontal lobe. Chronic microvascular ischemic change in the white matter bilaterally. Negative for hemorrhage or mass. No hydrocephalus identified. Vascular: Normal arterial flow voids. In particular, normal flow voids in both vertebral arteries and the basilar artery and both carotid arteries. Skull and upper cervical spine: Negative Sinuses/Orbits: Paranasal sinuses clear. Bilateral cataract surgery. Other: None MRA HEAD FINDINGS Both vertebral arteries widely patent. Basilar widely patent. PICA not visualized. Small left AICA patent. Superior cerebellar arteries not visualized. Both posterior cerebral arteries are patent. Moderate stenosis left P1 segment. Mild stenosis distal posterior cerebral artery bilaterally. Cavernous carotid artery patent bilaterally  with atherosclerotic irregularity. Moderate stenosis of the cavernous carotid on the left and mild stenosis supraclinoid internal carotid artery on the right. Both anterior cerebral arteries patent. M1 segments patent bilaterally. Moderate to severe disease in the M2 branches bilaterally. Negative for aneurysm. IMPRESSION: 1. Multiple large areas of acute infarction in the posterior circulation involving the cerebellum bilaterally, occipital lobes bilaterally, left thalamus, and left tectum. No hemorrhage. Findings may be due to posterior circulation emboli. Suggest CTA head and neck for further evaluation of the arterial circulation. 2. Diffuse intracranial atherosclerotic disease. Electronically Signed   By: Franchot Gallo M.D.   On: 05/16/2019 08:41   Vas [place] Lower Extremity Venous (dvt)  Result Date: 05/17/2019  Lower Venous Study Indications: Swelling,  Pain, and stroke.  Limitations: Patient positioning. Performing Technologist: Abram Sander RVS  Examination Guidelines: A complete evaluation includes B-mode imaging, spectral Doppler, color Doppler, and power Doppler as needed of all accessible portions of each vessel. Bilateral testing is considered an integral part of a complete examination. Limited examinations for reoccurring indications may be performed as noted.  +---------+---------------+---------+-----------+----------+-------+ RIGHT    CompressibilityPhasicitySpontaneityPropertiesSummary +---------+---------------+---------+-----------+----------+-------+ CFV      Full           Yes      Yes                          +---------+---------------+---------+-----------+----------+-------+ SFJ      Full                                                 +---------+---------------+---------+-----------+----------+-------+ FV Prox  Full                                                 +---------+---------------+---------+-----------+----------+-------+ FV Mid   Full                                                 +---------+---------------+---------+-----------+----------+-------+ FV DistalFull                                                 +---------+---------------+---------+-----------+----------+-------+ PFV      Full                                                 +---------+---------------+---------+-----------+----------+-------+ POP      Full           Yes      Yes                          +---------+---------------+---------+-----------+----------+-------+ PTV      Full                                                 +---------+---------------+---------+-----------+----------+-------+  PERO     Full                                                 +---------+---------------+---------+-----------+----------+-------+   +---------+---------------+---------+-----------+----------+-------+ LEFT      CompressibilityPhasicitySpontaneityPropertiesSummary +---------+---------------+---------+-----------+----------+-------+ CFV      Full           Yes      Yes                          +---------+---------------+---------+-----------+----------+-------+ SFJ      Full                                                 +---------+---------------+---------+-----------+----------+-------+ FV Prox  Full                                                 +---------+---------------+---------+-----------+----------+-------+ FV Mid   Full                                                 +---------+---------------+---------+-----------+----------+-------+ FV DistalFull                                                 +---------+---------------+---------+-----------+----------+-------+ PFV      Full                                                 +---------+---------------+---------+-----------+----------+-------+ POP      Full           Yes      Yes                          +---------+---------------+---------+-----------+----------+-------+ PTV      Full                                                 +---------+---------------+---------+-----------+----------+-------+ PERO     Full                                                 +---------+---------------+---------+-----------+----------+-------+     Summary: Right: There is no evidence of deep vein thrombosis in the lower extremity. No cystic structure found in the popliteal fossa. Left: There is no evidence of deep vein thrombosis in the lower extremity. No cystic structure found in the popliteal fossa.  *See table(s) above for measurements  and observations. Electronically signed by Curt Jews MD on 05/17/2019 at 5:01:36 PM.    Final       Subjective: Patient was seen and examined.  No overnight events.  She is little bit confused because of blindness.  She was more alert and awake today and talk about  rehab.   Discharge Exam: Vitals:   05/19/19 0351 05/19/19 0737  BP: 119/89 115/78  Pulse: 65 73  Resp: 14 18  Temp: (!) 97.5 F (36.4 C) 97.8 F (36.6 C)  SpO2: 96% 97%   Vitals:   05/18/19 1920 05/19/19 0018 05/19/19 0351 05/19/19 0737  BP: 98/75 112/71 119/89 115/78  Pulse: 62 60 65 73  Resp: 15 12 14 18   Temp: 98.5 F (36.9 C) (!) 97.5 F (36.4 C) (!) 97.5 F (36.4 C) 97.8 F (36.6 C)  TempSrc: Oral Oral Oral Oral  SpO2: 98% 97% 96% 97%  Weight:      Height:        General: Pt is alert, awake, not in acute distress, on room air. Cardiovascular: RRR, S1/S2 +, no rubs, no gallops Respiratory: CTA bilaterally, no wheezing, no rhonchi Abdominal: Soft, NT, ND, bowel sounds + Extremities: no edema, no cyanosis  Neuro exam: Bilateral cortical blindness with legal blindness.  Only able to see waving of fingers.  No motor or sensory deficit on extremities. Left chest wall: Dehiscent post mastectomy wound with surgical dressing.  Daily dressing advised.  The results of significant diagnostics from this hospitalization (including imaging, microbiology, ancillary and laboratory) are listed below for reference.     Microbiology: Recent Results (from the past 240 hour(s))  Culture, blood (Routine X 2) w Reflex to ID Panel     Status: None (Preliminary result)   Collection Time: 05/15/19  9:10 AM   Specimen: Left Antecubital; Blood  Result Value Ref Range Status   Specimen Description LEFT ANTECUBITAL  Final   Special Requests   Final    BOTTLES DRAWN AEROBIC AND ANAEROBIC Blood Culture adequate volume   Culture   Final    NO GROWTH 4 DAYS Performed at [facility], [address] [place], [place] [address]    Report Status PENDING  Incomplete  Urine culture     Status: None   Collection Time: 05/15/19  9:11 AM   Specimen: Urine, Clean Catch  Result Value Ref Range Status   Specimen Description   Final    URINE, CLEAN CATCH Performed at [facility], [address], [place], [place] [address]    Special Requests   Final    NONE Performed at [facility], [address], [place], [place] [address]    Culture   Final    NO GROWTH Performed at [facility], [place] [address] [place], [place] [address]    Report Status 05/16/2019 FINAL  Final  SARS Coronavirus 2 (CEPHEID - Performed in [facility] hospital lab), Hosp Order     Status: None   Collection Time: 05/15/19  9:12 AM   Specimen: Nasopharyngeal Swab  Result Value Ref Range Status   SARS Coronavirus 2 NEGATIVE NEGATIVE Final    Comment: (NOTE) If result is NEGATIVE SARS-CoV-2 target nucleic acids are NOT DETECTED. The SARS-CoV-2 RNA is generally detectable in upper and lower  respiratory specimens during the acute phase of infection. The lowest  concentration of SARS-CoV-2 viral copies this assay can detect is 250  copies / mL. A negative result does not preclude SARS-CoV-2 infection  and  should not be used as the sole basis for treatment or other  patient management decisions.  A negative result may occur with  improper specimen collection / handling, submission of specimen other  than nasopharyngeal swab, presence of viral mutation(s) within the  areas targeted by this assay, and inadequate number of viral copies  (<250 copies / mL). A negative result must be combined with clinical  observations, patient history, and epidemiological information. If result is POSITIVE SARS-CoV-2 target nucleic acids are DETECTED. The SARS-CoV-2 RNA is generally detectable in upper and lower  respiratory specimens dur ing the acute phase of infection.  Positive  results are indicative of active infection with SARS-CoV-2.  Clinical  correlation with patient history and other diagnostic information is  necessary to determine patient infection status.  Positive results do  not rule out bacterial infection or co-infection with other viruses. If result is PRESUMPTIVE POSTIVE SARS-CoV-2 nucleic acids MAY  BE PRESENT.   A presumptive positive result was obtained on the submitted specimen  and confirmed on repeat testing.  While 2019 novel coronavirus  (SARS-CoV-2) nucleic acids may be present in the submitted sample  additional confirmatory testing may be necessary for epidemiological  and / or clinical management purposes  to differentiate between  SARS-CoV-2 and other Sarbecovirus currently known to infect humans.  If clinically indicated additional testing with an alternate test  methodology [id number]) is advised. The SARS-CoV-2 RNA is generally  detectable in upper and lower respiratory sp ecimens during the acute  phase of infection. The expected result is Negative. Fact Sheet for Patients:  StrictlyIdeas.no Fact Sheet for Healthcare Providers: BankingDealers.co.za This test is not yet approved or cleared by the [place] FDA and has been authorized for detection and/or diagnosis of SARS-CoV-2 by FDA under an Emergency Use Authorization (EUA).  This EUA will remain in effect (meaning this test can be used) for the duration of the COVID-19 declaration under Section 564(b)(1) of the Act, 21 U.S.C. section 360bbb-3(b)(1), unless the authorization is terminated or revoked sooner. Performed at [facility], [address] [place], [place] [address]   Culture, blood (Routine X 2) w Reflex to ID Panel     Status: None (Preliminary result)   Collection Time: 05/15/19  3:44 PM   Specimen: BLOOD RIGHT WRIST  Result Value Ref Range Status   Specimen Description BLOOD RIGHT WRIST  Final   Special Requests   Final    BOTTLES DRAWN AEROBIC ONLY Blood Culture adequate volume   Culture   Final    NO GROWTH 4 DAYS Performed at [facility], [address], [place], [place] [address]    Report Status PENDING  Incomplete     Labs: BNP (last 3 results) No results for input(s): BNP in the last 8760 hours. Basic Metabolic Panel: Recent Labs   Lab 05/15/19 0910  NA 133*  K 3.9  CL 95*  CO2 20*  GLUCOSE 159*  BUN 16  CREATININE 0.69  CALCIUM 9.2   Liver Function Tests: Recent Labs  Lab 05/15/19 0910  AST 20  ALT 16  ALKPHOS 58  BILITOT 1.2  PROT 7.2  ALBUMIN 3.7   Recent Labs  Lab 05/15/19 0910  LIPASE 25   No results for input(s): AMMONIA in the last 168 hours. CBC: Recent Labs  Lab 05/15/19 0910  WBC 12.8*  NEUTROABS 11.5*  HGB 14.7  HCT 43.6  MCV 94.0  PLT 191   Cardiac Enzymes: Recent Labs  Lab 05/15/19 0910  TROPONINI <  0.03   BNP: Invalid input(s): POCBNP CBG: No results for input(s): GLUCAP in the last 168 hours. D-Dimer No results for input(s): DDIMER in the last 72 hours. Hgb A1c Recent Labs    05/17/19 0419  HGBA1C 5.6   Lipid Profile Recent Labs    05/17/19 0419  CHOL 215*  HDL 46  LDLCALC 139*  TRIG 148  CHOLHDL 4.7   Thyroid function studies No results for input(s): TSH, T4TOTAL, T3FREE, THYROIDAB in the last 72 hours.  Invalid input(s): FREET3 Anemia work up No results for input(s): VITAMINB12, FOLATE, FERRITIN, TIBC, IRON, RETICCTPCT in the last 72 hours. Urinalysis    Component Value Date/Time   COLORURINE YELLOW 05/15/2019 0911   APPEARANCEUR CLEAR 05/15/2019 0911   LABSPEC 1.018 05/15/2019 0911   PHURINE 6.0 05/15/2019 0911   GLUCOSEU 50 (A) 05/15/2019 0911   HGBUR SMALL (A) 05/15/2019 0911   BILIRUBINUR NEGATIVE 05/15/2019 0911   KETONESUR 80 (A) 05/15/2019 0911   PROTEINUR 30 (A) 05/15/2019 0911   NITRITE NEGATIVE 05/15/2019 0911   LEUKOCYTESUR NEGATIVE 05/15/2019 0911   Sepsis Labs Invalid input(s): PROCALCITONIN,  WBC,  LACTICIDVEN Microbiology Recent Results (from the past 240 hour(s))  Culture, blood (Routine X 2) w Reflex to ID Panel     Status: None (Preliminary result)   Collection Time: 05/15/19  9:10 AM   Specimen: Left Antecubital; Blood  Result Value Ref Range Status   Specimen Description LEFT ANTECUBITAL  Final   Special  Requests   Final    BOTTLES DRAWN AEROBIC AND ANAEROBIC Blood Culture adequate volume   Culture   Final    NO GROWTH 4 DAYS Performed at [facility], [address], [place], [place] [address]    Report Status PENDING  Incomplete  Urine culture     Status: None   Collection Time: 05/15/19  9:11 AM   Specimen: Urine, Clean Catch  Result Value Ref Range Status   Specimen Description   Final    URINE, CLEAN CATCH Performed at [facility], [address], [place], [place] [address]    Special Requests   Final    NONE Performed at [facility], [address], [place], [place] [address]    Culture   Final    NO GROWTH Performed at [facility], [place] [address] [place], [place] [address]    Report Status 05/16/2019 FINAL  Final  SARS Coronavirus 2 (CEPHEID - Performed in [place] hospital lab), Hosp Order     Status: None   Collection Time: 05/15/19  9:12 AM   Specimen: Nasopharyngeal Swab  Result Value Ref Range Status   SARS Coronavirus 2 NEGATIVE NEGATIVE Final    Comment: (NOTE) If result is NEGATIVE SARS-CoV-2 target nucleic acids are NOT DETECTED. The SARS-CoV-2 RNA is generally detectable in upper and lower  respiratory specimens during the acute phase of infection. The lowest  concentration of SARS-CoV-2 viral copies this assay can detect is 250  copies / mL. A negative result does not preclude SARS-CoV-2 infection  and should not be used as the sole basis for treatment or other  patient management decisions.  A negative result may occur with  improper specimen collection / handling, submission of specimen other  than nasopharyngeal swab, presence of viral mutation(s) within the  areas targeted by this assay, and inadequate number of viral copies  (<250 copies / mL). A negative result must be combined with clinical  observations, patient history, and epidemiological information. If result is  POSITIVE SARS-CoV-2 target nucleic acids are DETECTED. The SARS-CoV-2  RNA is generally detectable in upper and lower  respiratory specimens dur ing the acute phase of infection.  Positive  results are indicative of active infection with SARS-CoV-2.  Clinical  correlation with patient history and other diagnostic information is  necessary to determine patient infection status.  Positive results do  not rule out bacterial infection or co-infection with other viruses. If result is PRESUMPTIVE POSTIVE SARS-CoV-2 nucleic acids MAY BE PRESENT.   A presumptive positive result was obtained on the submitted specimen  and confirmed on repeat testing.  While 2019 novel coronavirus  (SARS-CoV-2) nucleic acids may be present in the submitted sample  additional confirmatory testing may be necessary for epidemiological  and / or clinical management purposes  to differentiate between  SARS-CoV-2 and other Sarbecovirus currently known to infect humans.  If clinically indicated additional testing with an alternate test  methodology [id number]) is advised. The SARS-CoV-2 RNA is generally  detectable in upper and lower respiratory sp ecimens during the acute  phase of infection. The expected result is Negative. Fact Sheet for Patients:  StrictlyIdeas.no Fact Sheet for Healthcare Providers: BankingDealers.co.za This test is not yet approved or cleared by the [place] FDA and has been authorized for detection and/or diagnosis of SARS-CoV-2 by FDA under an Emergency Use Authorization (EUA).  This EUA will remain in effect (meaning this test can be used) for the duration of the COVID-19 declaration under Section 564(b)(1) of the Act, 21 U.S.C. section 360bbb-3(b)(1), unless the authorization is terminated or revoked sooner. Performed at [facility], [address], [place], [place] [address]   Culture, blood (Routine X 2) w Reflex to ID Panel     Status: None (Preliminary result)   Collection Time: 05/15/19  3:44 PM    Specimen: BLOOD RIGHT WRIST  Result Value Ref Range Status   Specimen Description BLOOD RIGHT WRIST  Final   Special Requests   Final    BOTTLES DRAWN AEROBIC ONLY Blood Culture adequate volume   Culture   Final    NO GROWTH 4 DAYS Performed at [facility], [address], [place], [place] [address]    Report Status PENDING  Incomplete     Time coordinating discharge: 35 minutes  SIGNED:   Barb Merino, MD  [facility] 05/19/2019, 9:56 AM Pager [phone]  If 7PM-7AM, please contact night-coverage [email or web address] Password [email or web address]

## 2019-05-20 LAB — CULTURE, BLOOD (ROUTINE X 2)
Culture: NO GROWTH
Culture: NO GROWTH
Special Requests: ADEQUATE
Special Requests: ADEQUATE

## 2019-05-20 NOTE — TOC Transition Note (Addendum)
Transition of Care [facility]) - CM/SW Discharge Note   Patient Details  Name: Jessica Harrell MRN: [id number] Date of Birth: 1941/11/16  Transition of [facility]) CM/SW Contact:  Benard Halsted, LCSW Phone Number: 05/20/2019, 11:36 AM   Clinical Narrative:    Patient will DC to: [facility]  Anticipated DC date: 05/20/19 Family notified: Spouse and daughter Transport by: Veleta Miners   Per MD patient ready for DC to [facility]. RN, patient, patient's family, and facility notified of DC. Discharge Summary and FL2 sent to facility. RN to call report prior to discharge [phone]). DC packet on chart. Ambulance transport requested for patient.   CSW will sign off for now as social work intervention is no longer needed. Please consult [place] again if new needs arise.  Cedric Fishman, LCSW Clinical Social Worker [phone]    Final next level of care: Skilled Nursing Facility Barriers to Discharge: No Barriers Identified   Patient Goals and CMS Choice Patient states their goals for this hospitalization and ongoing recovery are:: patient unable to participate in goal setting at this time, not oriented CMS Medicare.gov Compare Post Acute Care list provided to:: Patient Represenative (must comment)(spouse/daughter) Choice offered to / list presented to : Spouse, Adult Children  Discharge Placement   Existing PASRR number confirmed : 05/20/19          Patient chooses bed at: [facility] Patient to be transferred to facility by: [place] Name of family member notified: Spouse and daughter Patient and family notified of of transfer: 05/20/19  Discharge Plan and Services     Post Acute Care Choice: [place] Arranged: NA          Social Determinants of Health (SDOH) Interventions     Readmission Risk Interventions No flowsheet data found.

## 2019-05-20 NOTE — TOC Progression Note (Signed)
Transition of Care [facility]) - Progression Note    Patient Details  Name: Jessica Harrell MRN: [id number] Date of Birth: 21-Nov-1941  Transition of Care [facility]) CM/SW [place], LCSW Phone Number: 05/20/2019, 10:49 AM  Clinical Narrative:    [facility] received insurance approval and updated COVID test. Patient can discharge today.    Expected Discharge Plan: [name] Barriers to Discharge: Continued Medical Work up, Ship broker  Expected Discharge Plan and Services Expected Discharge Plan: [place] Choice: [place] arrangements for the past 2 months: Single Family Home Expected Discharge Date: 05/20/19                                     Social Determinants of Health (SDOH) Interventions    Readmission Risk Interventions No flowsheet data found.

## 2019-05-20 NOTE — Progress Notes (Signed)
Patient discharged to [facility] in [place]. Discharge instructions given to SNF staff, Pamala Hurry and copy sent with PTAR. Midline removed. Patient tolerated well. Transported via stretcher with PTAR. Wendee Copp

## 2019-05-21 ENCOUNTER — Encounter (HOSPITAL_COMMUNITY): Payer: Self-pay | Admitting: Internal Medicine

## 2019-05-23 ENCOUNTER — Telehealth: Payer: Self-pay

## 2019-05-23 NOTE — Telephone Encounter (Signed)
RN received call from [facility] in [place] regarding upcoming appointment.     Pt has been recently hospitalized and is now in SNF for rehabilitation.  Transportation coordinator Inez Catalina inquiring if this appointment can be virtual given situation.    RN reviewed with MD.  Per Dr. Geralyn Flash recommendations reschedule appointment for a few months out.  RN notified facility, and pt's daughter.  Scheduling message sent for r/s.

## 2019-05-25 ENCOUNTER — Telehealth: Payer: Self-pay | Admitting: Adult Health

## 2019-05-25 ENCOUNTER — Ambulatory Visit: Payer: Medicare Other | Admitting: Hematology and Oncology

## 2019-05-25 NOTE — Telephone Encounter (Signed)
Jessica Harrell at [facility] gave consent.  Pt understands that although there may be some limitations with this type of visit, we will take all precautions to reduce any security or privacy concerns.  Pt understands that this will be treated like an in office visit and we will file with pt's insurance, and there may be a patient responsible charge related to this service.

## 2019-05-26 ENCOUNTER — Telehealth: Payer: Self-pay | Admitting: Hematology and Oncology

## 2019-05-26 NOTE — Telephone Encounter (Signed)
Per 6/19 schedule message scheduled f/u for September. Not able to reach dtr [place] or leave message at the number provided in schedule message [phone], voicemail full, or alternate of [phone] in Epic, voicemail not set up.  Left message for Inez Catalina in transportation at SNF at number provided in message [phone]. Schedule also mailed.

## 2019-06-12 ENCOUNTER — Telehealth: Payer: Self-pay | Admitting: Cardiology

## 2019-06-12 NOTE — Telephone Encounter (Signed)
New Message ° ° ° °Left message to confirm appt and answer COVID questions  °

## 2019-06-13 ENCOUNTER — Ambulatory Visit: Payer: Medicare Other | Admitting: Cardiology

## 2019-06-26 ENCOUNTER — Encounter: Payer: Self-pay | Admitting: Adult Health

## 2019-06-26 ENCOUNTER — Other Ambulatory Visit: Payer: Self-pay

## 2019-06-26 ENCOUNTER — Ambulatory Visit (INDEPENDENT_AMBULATORY_CARE_PROVIDER_SITE_OTHER): Payer: Medicare Other | Admitting: Adult Health

## 2019-06-26 VITALS — BP 110/58 | HR 82 | Wt 135.0 lb

## 2019-06-26 DIAGNOSIS — I639 Cerebral infarction, unspecified: Secondary | ICD-10-CM

## 2019-06-26 DIAGNOSIS — I10 Essential (primary) hypertension: Secondary | ICD-10-CM

## 2019-06-26 DIAGNOSIS — E785 Hyperlipidemia, unspecified: Secondary | ICD-10-CM | POA: Diagnosis not present

## 2019-06-26 NOTE — Progress Notes (Signed)
[facility] [address] [place]. [name] [address] [phone]       HOSPITAL FOLLOW UP NOTE  [name] Date of Birth:  03/11/1941 Medical Record Number:  [id number]   Reason for Referral:  hospital stroke follow up  Virtual Visit via Video Note  I connected with Jessica Harrell on 06/26/19 at  9:15 AM EDT by a video enabled telemedicine application with assistance of facility speech therapist, [facility] and verified that I am speaking with the correct person using two identifiers.  Location: Patient: Jessica Harrell in [place], [place] Provider: [facility]   I discussed the limitations of evaluation and management by telemedicine and the availability of in person appointments. The patient expressed understanding and agreed to proceed.    CHIEF COMPLAINT:  Chief Complaint  Patient presents with  . Follow-up    Hospital stroke follow-up    HPI: Jessica Harrell was initially scheduled today for in office hospital follow-up regarding recent stroke on 05/15/2019 but due to COVID-19 safety precautions, visit transition to telemedicine via [email or web address] with patients consent. History obtained from facility speech therapist, [facility] and chart review. Reviewed all radiology images and labs personally.  Ms. Ayushi Pla is a 78 y.o. female with history of breast cancer s/p mastectomy on 05/09/2019 and smoker who was discharged on 05/10/2019, did well for 2 days then became weak and lethargic on 05/13/2019, developing confusion and decreased p.o. intake.  Per review of note, husband initially believed symptoms are secondary to recent surgery and residual effects of anesthesia.  Symptoms worsened and EMS called on 05/15/2019.  She presented to [facility] ED with CT showing bilateral cerebellar and left occipital lobe hypoattenuation.  MRI showed multiple bilateral posterior circulation cerebellar infarcts in the occipital lobes bilaterally, left thalamus  and left tectum.  MRI showed diffuse intracranial arthrosclerosis.  Transferred to [facility] for further evaluation and management.  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  2D echo showed an EF of 60 to 65% without cardiac source of embolus identified and negative bubble.  TEE positive for PFO and bidirectional shunt at rest but no evidence of vegetation or clots.  Lower extremity venous Doppler negative for DVT.  Recommended loop recorder for atrial fibrillation monitoring once complete healing of recent mastectomy wound and plans on following with cardiology outpatient.  LDL 139 and A1c 5.6.  Initiated aspirin and rosuvastatin for secondary stroke prevention.  Other stroke risk factors include advanced age, tobacco use and EtOH use.  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  Residual deficits blurred vision (increased difficulty close up), balance deficits, mild cognitive impairment and dysphagia.  All residual deficits have been improving and she continues to participate in therapies. Did have FEES and recommend NTL but speech therapy felt it was not safe due to continued choking therefore went back to HTL. Ambulation has improved with mild leaning towards right with use of RW with therapy but uses WC while not with therapies for transportation.  Mild processing delay but overall improved. She plans on leaving facility today to return home with husband and daughter with recommendations of home health therapies Continues on aspirin 81 mg without bleeding or bruising Continues on Crestor 10 mg without myalgias Blood pressure 110/58 Cardiology appointment 07/11/2019  and likely will discuss possible loop recorder placement No concerns at this time.  Denies new or worsening  stroke/TIA symptoms.     ROS:   14 system review of systems performed and negative with exception of blurred vision, walking difficulty and swallowing difficulty  PMH:  Past Medical History:  Diagnosis Date  . Breast cancer ([place])   . Right foot pain Nov. 2014    PSH:  Past Surgical History:  Procedure Laterality Date  . BREAST BIOPSY    . BUBBLE STUDY  05/18/2019   Procedure: BUBBLE STUDY;  Surgeon: Elouise Munroe, MD;  Location: [facility];  Service: Cardiology;;  . CATARACT EXTRACTION     both eyes  . EYE SURGERY Left   . MASTECTOMY W/ SENTINEL NODE BIOPSY Left 05/09/2019   Procedure: LEFT MASTECTOMY WITH LEFT AXILLARY SENTINEL LYMPH NODE BIOPSY;  Surgeon: Coralie Keens, MD;  Location: [place];  Service: General;  Laterality: Left;  . TEE WITHOUT CARDIOVERSION N/A 05/18/2019   Procedure: TRANSESOPHAGEAL ECHOCARDIOGRAM (TEE);  Surgeon: Elouise Munroe, MD;  Location: [place];  Service: Cardiology;  Laterality: N/A;    Social History:  Social History   Socioeconomic History  . Marital status: Married    Spouse name: Not on file  . Number of children: Not on file  . Years of education: Not on file  . Highest education level: Not on file  Occupational History    Comment: retired  Scientific laboratory technician  . Financial resource strain: Not on file  . Food insecurity    Worry: Not on file    Inability: Not on file  . Transportation needs    Medical: Not on file    Non-medical: Not on file  Tobacco Use  . Smoking status: Current Every Day Smoker    Packs/day: 0.50    Years: 58.00    Pack years: 29.00    Types: Cigarettes  . Smokeless tobacco: Never Used  Substance and Sexual Activity  . Alcohol use: Yes    Alcohol/week: 7.0 standard drinks    Types: 7 Cans of beer per week    Comment: 1 beer every 1-2 days  . Drug use: No  . Sexual activity: Not Currently  Lifestyle  . Physical activity    Days per week: Not on file    Minutes per session: Not on file  .  Stress: Not on file  Relationships  . Social Herbalist on phone: Not on file    Gets together: Not on file    Attends religious service: Not on file    Active member of club or organization: Not on file    Attends meetings of clubs or organizations: Not on file    Relationship status: Not on file  . Intimate partner violence    Fear of current or ex partner: Not on file    Emotionally abused: Not on file    Physically abused: Not on file    Forced sexual activity: Not on file  Other Topics Concern  . Not on file  Social History Narrative   Resides in [place]. Has one grown daughter. No grandchildren. No pets.     Family History:  Family History  Problem Relation Age of Onset  . Cancer Mother        esophageal  . Hypertension Mother   . Varicose Veins Mother   . Cancer Sister        non hodgkins  . Cancer Other  unknown    Medications:   Current Outpatient Medications on File Prior to Visit  Medication Sig Dispense Refill  . aspirin EC 81 MG tablet Take 81 mg by mouth daily.    . cholecalciferol (VITAMIN D) 1000 units tablet Take 1,000 Units by mouth daily.    Marland Kitchen letrozole (FEMARA) 2.5 MG tablet TAKE 1 TABLET ONCE DAILY. (Patient taking differently: Take 2.5 mg by mouth daily. ) 90 tablet 0  . rosuvastatin (CRESTOR) 10 MG tablet Take 1 tablet (10 mg total) by mouth daily at 6 PM. 30 tablet 2   No current facility-administered medications on file prior to visit.     Allergies:   Allergies  Allergen Reactions  . Atorvastatin Other (See Comments)    Muscle pain      Physical Exam  Vitals:   06/26/19 0930  BP: (!) 110/58  Pulse: 82  Weight: 135 lb (61.2 kg)   Body mass index is 21.14 kg/m. No exam data present  Depression screen [facility] 2/9 06/26/2019  Decreased Interest 0  Down, Depressed, Hopeless 0  PHQ - 2 Score 0     General: well developed, well nourished, pleasant elderly Caucasian female, seated, in no evident distress Head: head  normocephalic and atraumatic.    Neurologic Exam Mental Status: Awake and fully alert. Delayed processing speed with delayed answering. Oriented to place and time. Recent and remote memory intact. Attention span, concentration and fund of knowledge appropriate. Mood and affect appropriate.  Cranial Nerves: Extraocular movements full without nystagmus. Hearing intact to voice. Facial sensation intact. Face, tongue, palate moves normally and symmetrically.  Shoulder shrug symmetric. Motor: No evidence of weakness per drift assessment Sensory.: intact to light touch Coordination: Rapid alternating movements normal in all extremities. Finger-to-nose and heel-to-shin performed accurately bilaterally with mild dysmetria. Gait and Station: gait assessment deferred  Reflexes: UTA    NIHSS  0 Modified Rankin  3   Diagnostic Data (Labs, Imaging, Testing)   CT head bilateral cerebellar and left occipital infarcts  MRI multiple bilateral posterior circulation cerebellar infarcts in the occipital lobes bilaterally, left thalamus and left tectum  MRA diffuse intracranial atherosclerosis  CTA head and neck 50% stenosis of left vertebral artery origin otherwise no other large vessel stenosis or occlusion   carotid Doppler the CTA results  2D Echo EF 60-65%. No source of embolus. Negative bubble  TEE PFO with bidirectional shunting at rest but negative for endocarditis or cardiac source of embolus  LE Doppler negative for DVT  LDL 139 mg percent  HgbA1c 5.6    ASSESSMENT: Jessica Harrell is a 78 y.o. year old female who presented to [facility] ED with somnolence and confusion on 05/15/2019 and soon transferred to [facility].  Stroke work-up revealed bilateral cerebellar, bilateral occipital, left thalamic and left abdomen infarcts embolic pattern secondary to unknown source with differentials including hypercoagulability with a cancer diagnosis or atrial fibrillation as endocarditis ruled out.   Recently underwent mastectomy on 05/09/2019.  Recommended loop recorder placement once mastectomy wound healed for atrial fibrillation monitoring.  Vascular risk factors include HTN, HLD, intracranial arthrosclerosis, advanced age, tobacco use and EtOH use.  Discharge to [facility] rehabilitation and plans on returning home today with home health therapies.  Residual deficits of blurred vision, dysphagia, mild cognitive impairment with delayed processing speed and balance deficits.    PLAN:  1. Cryptogenic stroke: Continue aspirin 81 mg daily  and rosuvastatin 10 mg daily for secondary stroke prevention. Maintain strict control of hypertension  with blood pressure goal below 130/90, diabetes with hemoglobin A1c goal below 6.5% and cholesterol with LDL cholesterol (bad cholesterol) goal below 70 mg/dL.  I also advised the patient to eat a healthy diet with plenty of whole grains, cereals, fruits and vegetables, exercise regularly with at least 30 minutes of continuous activity daily and maintain ideal body weight. 2. Residual deficits: Plans on leaving facility today to return home with family and home health therapy.  Continue to participate in therapy with possible need of outpatient therapy 3. HTN: Advised to continue current treatment regimen.  Today's BP satisfactory.  Advised to continue to monitor at home along with continued follow-up with PCP for management 4. HLD: Advised to continue current treatment regimen along with continued follow-up with PCP for future prescribing and monitoring of lipid panel 5. Possible loop recorder implant: Follow-up with cardiology on 07/11/2019 as it was recommended to hold off loop recorder placement until complete healing of mastectomy wound    Follow up in 3 months or call earlier if needed   Greater than 50% of time during this 30 minute non-face-to-face visit was spent on counseling, explanation of diagnosis of cryptogenic stroke, reviewing risk factor  management of HTN and HLD, planning of further management along with potential future management, and discussion with patient and family answering all questions.    Venancio Poisson, AGNP-BC  [facility] [address] [place], [place] [address]  Phone [phone] Fax [phone] Note: This document was prepared with digital dictation and possible smart phrase technology. Any transcriptional errors that result from this process are unintentional.

## 2019-06-26 NOTE — Patient Instructions (Signed)
Continues to improve regarding residual stroke deficits.  Greatest recovery is typically seen over 72-month period but you can continue to see improvements at 6 months and even up to 1 year.  As we do not know the exact cause of your stroke, it was recommended to have loop recorder placed to assess for atrial fibrillation and you will follow-up with cardiology on 07/11/2019 as scheduled.  Continue on aspirin 81 mg daily and Crestor 10 mg daily for secondary stroke prevention.  He will continue to follow with your PCP for blood pressure and cholesterol management and monitoring.  Highly recommend participating in home health therapies once he returned home and you may need additional outpatient therapies once home health completed.  Request follow-up office visit in 3 months time which was scheduled on 09/27/2019 at 12:45 PM.  If you are unable to make this appointment will need to reschedule, please call office to reschedule.

## 2019-06-26 NOTE — Progress Notes (Signed)
I agree with the above plan 

## 2019-07-11 ENCOUNTER — Ambulatory Visit: Payer: Medicare Other | Admitting: Cardiology

## 2019-07-30 NOTE — Progress Notes (Signed)
Cardiology Office Note Date:  08/01/2019  Patient ID:  Jessica, Harrell 03-25-1941, MRN [id number] PCP:  [name], MD  Electrophysiologist:  Dr. Curt Bears    Chief Complaint: revisit loop implant  History of Present Illness: Jessica Harrell is a 78 y.o. 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    She was seen by myself and Dr. Curt Bears during her hospital stay for her stroke to evaluate for loop.  Though that day she had just had a drain removed from her mastectomy, though not felt by surgical service to be infected, felt important that we allo full healing prior to consideration for loop. EKGs and ele were negative for AFib Her TEE noted PFO, had negative venous dopplers  She comes in today to re-visit loop implant.  She saw neurology 06/26/2019,  They noted she had residual deficits blurred vision (increased difficulty close up), balance deficits, mild cognitive impairment and dysphagia.  All residual deficits have been improving and she continues to participate in therapies.  Plans to retrurn to home from rehab.  Noted her pending f/u here to revisit loop implant.   She comes today with her daughter.  She is getting PT/OT at home, remains in wheelchair, her speech is improving though remains with difficuty.  She is recovered from her mastectomy.  No palpitations, no CP, no syncope or near syncope.  We revisit today the rational for monitoring, discussed event monitor vs loop monitor, potential risks/benefits of both, Dr. Curt Bears discussed with the patient and daughter today as well.   Past Medical History:  Diagnosis Date  . Breast cancer ([place])   . Right foot pain Nov. 2014    Past Surgical History:  Procedure Laterality Date  . BREAST BIOPSY    . BUBBLE STUDY  05/18/2019   Procedure: BUBBLE STUDY;  Surgeon: Elouise Munroe, MD;  Location: [facility] ENDOSCOPY;  Service:  Cardiology;;  . CATARACT EXTRACTION     both eyes  . EYE SURGERY Left   . MASTECTOMY W/ SENTINEL NODE BIOPSY Left 05/09/2019   Procedure: LEFT MASTECTOMY WITH LEFT AXILLARY SENTINEL LYMPH NODE BIOPSY;  Surgeon: Coralie Keens, MD;  Location: [place];  Service: General;  Laterality: Left;  . TEE WITHOUT CARDIOVERSION N/A 05/18/2019   Procedure: TRANSESOPHAGEAL ECHOCARDIOGRAM (TEE);  Surgeon: Elouise Munroe, MD;  Location: [place];  Service: Cardiology;  Laterality: N/A;    Current Outpatient Medications  Medication Sig Dispense Refill  . amLODipine (NORVASC) 10 MG tablet Take 10 mg by mouth daily.    Marland Kitchen aspirin EC 81 MG tablet Take 81 mg by mouth daily.    . cholecalciferol (VITAMIN D) 1000 units tablet Take 1,000 Units by mouth daily.    Marland Kitchen letrozole (FEMARA) 2.5 MG tablet TAKE 1 TABLET ONCE DAILY. (Patient taking differently: Take 2.5 mg by mouth daily. ) 90 tablet 0  . omeprazole (PRILOSEC) 40 MG capsule Take 40 mg by mouth as needed.    . rosuvastatin (CRESTOR) 10 MG tablet Take 1 tablet (10 mg total) by mouth daily at 6 PM. 30 tablet 2   No current facility-administered medications for this visit.     Allergies:   Atorvastatin   Social History:  The patient  reports that she has been smoking cigarettes. She has a 29.00 pack-year smoking history. She has never used smokeless tobacco. She reports current alcohol use of about 7.0 standard drinks of alcohol per week.  She reports that she does not use drugs.   Family History:  The patient's family history includes Cancer in her mother, sister, and another family member; Hypertension in her mother; Varicose Veins in her mother.  ROS:  Please see the history of present illness.  All other systems are reviewed and otherwise negative.   PHYSICAL EXAM:  VS:  BP 122/68   Pulse 83   Ht 5\' 7"  (1.702 m)   Wt 135 lb (61.2 kg)   BMI 21.14 kg/m  BMI: Body mass index is 21.14 kg/m. Well nourished, well developed, in no acute distress   HEENT: normocephalic, atraumatic  Neck: no JVD, carotid bruits or masses Cardiac:  RRR; no significant murmurs, no rubs, or gallops Lungs:  CTA b/l, no wheezing, rhonchi or rales  Abd: soft, nontender MS: no deformity or atrophy Ext: no edema  Skin: warm and dry, no rash Neuro:  No gross deficits appreciated Psych: euthymic mood, full affect  L mastectomy site is well healed, no evidence of infection, skin is intact   EKG:  Done today and reviewed by myself shows SR 90bpm, baseline motion   05/18/2019: TEE IMPRESSIONS 1. Positive PFO with bidirectional flow - left to right shunt noted with color flow Doppler and right to left shunt noted with agitated saline. 2. No evidence of a thrombus present in the left atrial appendage. Normal emptying velocity 80 cm/s. 3. No left ventricular apical thrombus or LV mass. 4. The left ventricle has normal systolic function, with an ejection fraction of 60-65%. The cavity size was normal. Left ventricular diastolic function could not be evaluated. 5. The right ventricle has normal systolc function. The cavity was normal. There is no increase in right ventricular wall thickness. 6. Left atrial size was mildly dilated. 7. Mild sclerosis of the aortic valve, no aortic valve regurgitation. 8. The mitral valve is mildly degenerative with a possible tiny degenerative strand on the lateral and anterior aspect of the valve, atrial side. 9. The aortic root and ascending aorta are normal in size and structure. 10. There is evidence of moderate plaque in the aortic arch and descending aorta.  FINDINGS Left Ventricle: The left ventricle has normal systolic function, with an ejection fraction of 60-65%. The cavity size was normal. There is no increase in left ventricular wall thickness. Left ventricular diastolic function could not be evaluated.  Right Ventricle: The right ventricle has normal systolic function. The cavity was normal. There is no  increase in right ventricular wall thickness.  Left Atrium: Left atrial size was mildly dilated.   Left Atrial Appendage: No evidence of a thrombus present in the left atrial appendage.  Right Atrium: Right atrial size was normal in size. Right atrial pressure is estimated at 10 mmHg.  Interatrial Septum: Agitated saline contrast was given intravenously to evaluate for intracardiac shunting. Saline contrast bubble study was positive, with evidence of interatrial shunting.  Pericardium: There is no evidence of pericardial effusion.  Mitral Valve: The mitral valve is degenerative in appearance. Mitral valve regurgitation is trivial by color flow Doppler.  Tricuspid Valve: The tricuspid valve was normal in structure. Tricuspid valve regurgitation is trivial by color flow Doppler.  Aortic Valve: The aortic valve is tricuspid Mild sclerosis of the aortic valve. Aortic valve regurgitation was not visualized by color flow Doppler.  Pulmonic Valve: The pulmonic valve was normal in structure. Pulmonic valve regurgitation is trivial by color flow Doppler.  Aorta: The aortic root and ascending aorta are normal in size  and structure. There is evidence of moderate plaque in the aortic arch and descending aorta.   05/17/2019: LE venous US Summary: Right: There is no evidence of deep vein thrombosis in the lower extremity. No cystic structure found in the popliteal fossa. Left: There is no evidence of deep vein thrombosis in the lower extremity. No cystic structure found in the popliteal fossa.   05/16/2019: TTE IMPRESSIONS 1. The left ventricle has normal systolic function with an ejection fraction of 60-65%. The cavity size was normal. Left ventricular diastolic function could not be evaluated due to nondiagnostic images. No evidence of left ventricular regional wall  motion abnormalities.  2. The right ventricle has normal systolic function. The cavity was normal. There is no  increase in right ventricular wall thickness. Right ventricular systolic pressure could not be assessed.  3. The aortic valve is tricuspid. Moderate sclerosis of the aortic valve.  4. Cannot rule out PFO by colorflow doppler. Recommend agitated saline contrast injection.    Recent Labs: 05/15/2019: ALT 16; BUN 16; Creatinine, Ser 0.69; Hemoglobin 14.7; Platelets 191; Potassium 3.9; Sodium 133  05/17/2019: Cholesterol 215; HDL 46; LDL Cholesterol 139; Total CHOL/HDL Ratio 4.7; Triglycerides 148; VLDL 30   CrCl cannot be calculated (Patient's most recent lab result is older than the maximum 21 days allowed.).   Wt Readings from Last 3 Encounters:  08/01/19 135 lb (61.2 kg)  06/26/19 135 lb (61.2 kg)  05/18/19 143 lb 1.3 oz (64.9 kg)     Other studies reviewed: Additional studies/records reviewed today include: summarized above  ASSESSMENT AND PLAN:  1. Cryptogenic stroke     The patient and daughter are agreeable and would like to proceed     We will get her scheduled at Dr. Curt Bears next available day/time   Disposition: F/u with 14 day wound check, and as needed going forward from there    Current medicines are reviewed at length with the patient today.  The patient did not have any concerns regarding medicines.  Venetia Night, PA-C 08/01/2019 3:06 PM     [address] [place] [id number] [phone] (office)  [phone] (fax)

## 2019-08-01 ENCOUNTER — Other Ambulatory Visit: Payer: Self-pay

## 2019-08-01 ENCOUNTER — Ambulatory Visit (INDEPENDENT_AMBULATORY_CARE_PROVIDER_SITE_OTHER): Payer: Medicare Other | Admitting: Physician Assistant

## 2019-08-01 ENCOUNTER — Encounter: Payer: Self-pay | Admitting: Physician Assistant

## 2019-08-01 VITALS — BP 122/68 | HR 83 | Ht 67.0 in | Wt 135.0 lb

## 2019-08-01 DIAGNOSIS — I639 Cerebral infarction, unspecified: Secondary | ICD-10-CM | POA: Diagnosis not present

## 2019-08-01 NOTE — Patient Instructions (Addendum)
Medication Instructions:  Your physician recommends that you continue on your current medications as directed. Please refer to the Current Medication list given to you today.   If you need a refill on your cardiac medications before your next appointment, please call your pharmacy.   Lab work: NONE ORDERED  TODAY   If you have labs (blood work) drawn today and your tests are completely normal, you will receive your results only by: Marland Kitchen MyChart Message (if you have MyChart) OR . A paper copy in the mail If you have any lab test that is abnormal or we need to change your treatment, we will call you to review the results.  Testing/Procedures: NONE ORDERED  TODAY  Follow-Up: 7-10 DAYS WOUND CHECK AFTER 08-10-2019   YOU HAVE BEEN SCHEDULED FOR A LOOP INSERTION ON 08-10-2019 AT 1130 AM HERE AT [address] FLOOR. PLEASE ARRIVE 15 MINS  PRIOR TO YOUR PROCEDURE   Any Other Special Instructions Will Be Listed Below (If Applicable).

## 2019-08-02 NOTE — Progress Notes (Signed)
I agree with the above plan 

## 2019-08-02 NOTE — Assessment & Plan Note (Signed)
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Neoadjuvant antiestrogen therapy with letrozole 2.5 mg dailystarted 04/26/2018- 05/09/2019 2.followed by left mastectomy: 05/09/2019: Foci of residual IDC grade 1 with extracellular mucin largest focus 1.4 cm margins negative, separate focus of DCIS intermediate grade involving a complex sclerosing lesion, ER 100%, PR 0%, Ki-67 5%, HER-2 1+ negative, 0/1 sentinel lymph node negative 3.Followed by antiestrogen therapy x7 years with letrozole ----------------------------------------------------------------------------- Hospitalization June 2020: Multiple strokes, rehabilitation Current treatment: Letrozole 2.5 mg daily. Previously she tolerated letrozole extremely well without any side effects.  Breast cancer surveillance: Patient will need mammograms once a year on the right breast.

## 2019-08-08 NOTE — Progress Notes (Signed)
Patient Care Team: [name], MD as PCP - General (Family Medicine)  DIAGNOSIS:    ICD-10-CM   1. 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     CHIEF COMPLIANT: Follow-up s/p mastectomy to review pathology   INTERVAL HISTORY: Jessica Harrell is a 78 y.o. with above-mentioned history of left breast cancer currently on neoadjuvant antiestrogen therapy with letrozole. Mammogram on 04/10/19 showed decreased in the left breast mass from 4.5cm in 02/2019 to 3.8cm. She underwent a left mastectomy with Dr. Ninfa Linden on 05/09/19 for which pathology confirmed residual invasive ductal carcinoma, grade 2, 1.4cm, HER-2 negative (1+), ER 100%, PR negative, Ki67 5%, clear margins, with a 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 She was admitted from 6/8-6/15 for a stroke with bilateral cortical blindness. She was discharged on aspirin  to follow-up in neurology and PT/OT. She presents to the clinic today to discuss the pathology report and further treatment.   REVIEW OF SYSTEMS:   Constitutional: Denies fevers, chills or abnormal weight loss Eyes: Denies blurriness of vision Ears, nose, mouth, throat, and face: Denies mucositis or sore throat Respiratory: Denies cough, dyspnea or wheezes Cardiovascular: Denies palpitation, chest discomfort Gastrointestinal: Denies nausea, heartburn or change in bowel habits Skin: Denies abnormal skin rashes Lymphatics: Denies new lymphadenopathy or easy bruising Neurological: Denies numbness, tingling or new weaknesses Behavioral/Psych: Mood is stable, no new changes  Extremities: No lower extremity edema Breast: denies any pain or lumps or nodules in either breasts All other systems were reviewed with the patient and are negative.  I have reviewed the past medical history, past surgical history, social history and family history with the patient and they are unchanged from previous note.  ALLERGIES:  is allergic to atorvastatin.  MEDICATIONS:  Current Outpatient Medications  Medication Sig Dispense Refill   amLODipine (NORVASC) 10 MG tablet Take 10 mg by mouth daily.     aspirin EC 81 MG tablet Take 81 mg by mouth daily.     cholecalciferol (VITAMIN D) 1000 units tablet Take 1,000 Units by mouth daily.     letrozole (FEMARA) 2.5 MG tablet TAKE 1 TABLET ONCE DAILY. (Patient taking differently: Take 2.5 mg by mouth daily. ) 90 tablet 0   omeprazole (PRILOSEC) 40 MG capsule Take 40 mg by mouth as needed.     rosuvastatin (CRESTOR) 10 MG tablet Take 1 tablet (10 mg total) by mouth daily at 6 PM.  30 tablet 2   No current facility-administered medications for this visit.     PHYSICAL EXAMINATION: ECOG PERFORMANCE STATUS: 2 - Symptomatic, <50% confined to bed  There were no vitals filed for this visit. There were no vitals filed for this visit.  GENERAL: alert, no distress  and comfortable SKIN: skin color, texture, turgor are normal, no rashes or significant lesions EYES: normal, Conjunctiva are pink and non-injected, sclera clear OROPHARYNX: no exudate, no erythema and lips, buccal mucosa, and tongue normal  NECK: supple, thyroid normal size, non-tender, without nodularity LYMPH: no palpable lymphadenopathy in the cervical, axillary or inguinal LUNGS: clear to auscultation and percussion with normal breathing effort HEART: regular rate & rhythm and no murmurs and no lower extremity edema ABDOMEN: abdomen soft, non-tender and normal bowel sounds MUSCULOSKELETAL: no cyanosis of digits and no clubbing  NEURO: alert & oriented x 3 with fluent speech, no focal motor/sensory deficits EXTREMITIES: No lower extremity edema  LABORATORY DATA:  I have reviewed the data as listed CMP Latest Ref Rng & Units 05/15/2019 05/05/2019 09/22/2010  Glucose 70 - 99 mg/dL 159(H) 105(H) 88  BUN 8 - 23 mg/dL '16 9 9  ' Creatinine 0.44 - 1.00 mg/dL 0.69 0.92 0.96  Sodium 135 - 145 mmol/L 133(L) 134(L) 133(L)  Potassium 3.5 - 5.1 mmol/L 3.9 4.1 4.1  Chloride 98 - 111 mmol/L 95(L) 100 99  CO2 22 - 32 mmol/L 20(L) 25 28  Calcium 8.9 - 10.3 mg/dL 9.2 9.4 9.3  Total Protein 6.5 - 8.1 g/dL 7.2 - -  Total Bilirubin 0.3 - 1.2 mg/dL 1.2 - -  Alkaline Phos 38 - 126 U/L 58 - -  AST 15 - 41 U/L 20 - -  ALT 0 - 44 U/L 16 - -    Lab Results  Component Value Date   WBC 12.8 (H) 05/15/2019   HGB 14.7 05/15/2019   HCT 43.6 05/15/2019   MCV 94.0 05/15/2019   PLT 191 05/15/2019   NEUTROABS 11.5 (H) 05/15/2019    ASSESSMENT & PLAN:  Malignant neoplasm of lower-inner quadrant of left breast in female, estrogen receptor positive ([facility]) 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; stage  II a clinical stage AJCC 8  Treatment plan: 1.Neoadjuvant antiestrogen therapy with letrozole 2.5 mg dailystarted 04/26/2018- 05/09/2019 2.followed by left mastectomy: 05/09/2019: Foci of residual IDC grade 1 with extracellular mucin largest focus 1.4 cm margins negative, separate focus of DCIS intermediate grade involving a complex sclerosing lesion, ER 100%, PR 0%, Ki-67 5%, HER-2 1+ negative, 0/1 sentinel lymph node negative 3.Followed by antiestrogen therapy x7 years with letrozole ----------------------------------------------------------------------------- Hospitalization June 2020: Multiple strokes, rehabilitation, patient is blind and requires wheelchair. Her husband and daughter are worn out taking care of her.  They are hoping for some help at home.  Current treatment: Letrozole 2.5 mg daily. Previously she tolerated letrozole extremely well without any side effects.  Breast cancer surveillance: Patient will need mammograms once a year on the right breast.  But because of her health issues, she has not had one.  I did a manual exam and did not find any lumps or nodules of concern.  Return to clinic in 1 year for follow-up  No orders of the defined types were placed in this encounter.  The patient has a good understanding of the overall plan.  she agrees with it. she will call with any problems that may develop before the next visit here.  Nicholas Lose, MD 08/09/2019  Julious Oka Dorshimer am acting as scribe for Dr. Nicholas Lose.  I have reviewed the above documentation for accuracy and completeness, and I agree with the above.

## 2019-08-09 ENCOUNTER — Inpatient Hospital Stay: Payer: Medicare Other | Attending: Hematology and Oncology | Admitting: Hematology and Oncology

## 2019-08-09 ENCOUNTER — Other Ambulatory Visit: Payer: Self-pay

## 2019-08-09 DIAGNOSIS — Z170 Estrogen receptor positive status [ER+]: Secondary | ICD-10-CM | POA: Insufficient documentation

## 2019-08-09 DIAGNOSIS — C50312 Malignant neoplasm of lower-inner quadrant of left female breast: Secondary | ICD-10-CM | POA: Diagnosis not present

## 2019-08-09 DIAGNOSIS — Z9012 Acquired absence of left breast and nipple: Secondary | ICD-10-CM | POA: Diagnosis not present

## 2019-08-09 DIAGNOSIS — Z79899 Other long term (current) drug therapy: Secondary | ICD-10-CM | POA: Diagnosis not present

## 2019-08-09 DIAGNOSIS — Z79811 Long term (current) use of aromatase inhibitors: Secondary | ICD-10-CM | POA: Insufficient documentation

## 2019-08-09 DIAGNOSIS — Z8673 Personal history of transient ischemic attack (TIA), and cerebral infarction without residual deficits: Secondary | ICD-10-CM | POA: Diagnosis not present

## 2019-08-09 DIAGNOSIS — Z7982 Long term (current) use of aspirin: Secondary | ICD-10-CM | POA: Insufficient documentation

## 2019-08-09 MED ORDER — MIRTAZAPINE 15 MG PO TABS: 15.0000 mg | ORAL_TABLET | Freq: Every day | ORAL | Status: DC

## 2019-08-10 ENCOUNTER — Ambulatory Visit (INDEPENDENT_AMBULATORY_CARE_PROVIDER_SITE_OTHER): Payer: Medicare Other | Admitting: Cardiology

## 2019-08-10 ENCOUNTER — Encounter: Payer: Self-pay | Admitting: *Deleted

## 2019-08-10 ENCOUNTER — Telehealth: Payer: Self-pay | Admitting: Adult Health

## 2019-08-10 ENCOUNTER — Telehealth: Payer: Self-pay | Admitting: Hematology and Oncology

## 2019-08-10 ENCOUNTER — Encounter: Payer: Self-pay | Admitting: Cardiology

## 2019-08-10 VITALS — HR 76

## 2019-08-10 DIAGNOSIS — I639 Cerebral infarction, unspecified: Secondary | ICD-10-CM | POA: Diagnosis not present

## 2019-08-10 NOTE — Patient Instructions (Addendum)
Medication Instructions:  Your physician recommends that you continue on your current medications as directed. Please refer to the Current Medication list given to you today.  If you need a refill on your cardiac medications before your next appointment, please call your pharmacy.   Lab work: None ordered  Testing/Procedures: None ordered  Follow-Up:  Your physician recommends that you schedule a follow-up appointment on 08/18/19 at 12:10 with Oda Kilts, PA-C.  This is a virtual appointment via the telephone.    Any Other Special Instructions Will Be Listed Below (If Applicable). Do not get wet for 24 hours.  Okay to shower tomorrow night and try to keep the steri- strips on for 7 days.  If you have any questions or concerns please call the office.

## 2019-08-10 NOTE — Telephone Encounter (Signed)
Scheduled appt per 9/03 sch message - unable to reach pt . Mailed letter with appt date and time

## 2019-08-10 NOTE — Telephone Encounter (Signed)
I left a message regarding schedule  

## 2019-08-10 NOTE — Progress Notes (Signed)
Cardiology Office Note Date:  08/10/2019  Patient ID:  Jessica Harrell, Jessica Harrell 09-27-1941, MRN [id number] PCP:  [name], MD  Electrophysiologist:  Dr. Curt Bears    Chief Complaint: revisit loop implant  History of Present Illness: Jessica Harrell is a 78 y.o. 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    She was seen by myself and Dr. Curt Bears during her hospital stay for her stroke to evaluate for loop.  Though that day she had just had a drain removed from her mastectomy, though not felt by surgical service to be infected, felt important that we allo full healing prior to consideration for loop. EKGs and ele were negative for AFib Her TEE noted PFO, had negative venous dopplers  She comes in today to re-visit loop implant.  She saw neurology 06/26/2019,  They noted she had residual deficits blurred vision (increased difficulty close up), balance deficits, mild cognitive impairment and dysphagia.  All residual deficits have been improving and she continues to participate in therapies.  Plans to retrurn to home from rehab.  Noted her pending f/u here to revisit loop implant.  Today, denies symptoms of palpitations, chest pain, shortness of breath, orthopnea, PND, lower extremity edema, claudication, dizziness, presyncope, syncope, bleeding, or neurologic sequela. The patient is tolerating medications without difficulties. Plan for LINQ monitor insertion for cryptogenic stroke. She continues to undergo rehab and remains in a wheelchair.   Past Medical History:  Diagnosis Date  . Breast cancer ([place])   . Right foot pain Nov. 2014    Past Surgical History:  Procedure Laterality Date  . BREAST BIOPSY    . BUBBLE STUDY  05/18/2019   Procedure: BUBBLE STUDY;  Surgeon: Elouise Munroe, MD;  Location: [facility] ENDOSCOPY;  Service: Cardiology;;  . CATARACT EXTRACTION     both eyes  . EYE SURGERY  Left   . MASTECTOMY W/ SENTINEL NODE BIOPSY Left 05/09/2019   Procedure: LEFT MASTECTOMY WITH LEFT AXILLARY SENTINEL LYMPH NODE BIOPSY;  Surgeon: Coralie Keens, MD;  Location: [place];  Service: General;  Laterality: Left;  . TEE WITHOUT CARDIOVERSION N/A 05/18/2019   Procedure: TRANSESOPHAGEAL ECHOCARDIOGRAM (TEE);  Surgeon: Elouise Munroe, MD;  Location: [place];  Service: Cardiology;  Laterality: N/A;    Current Outpatient Medications  Medication Sig Dispense Refill  . amLODipine (NORVASC) 10 MG tablet Take 10 mg by mouth daily.    Marland Kitchen letrozole (FEMARA) 2.5 MG tablet TAKE 1 TABLET ONCE DAILY. (Patient taking differently: Take 2.5 mg by mouth daily. ) 90 tablet 0  . mirtazapine (REMERON) 15 MG tablet Take 1 tablet (15 mg total) by mouth at bedtime.    Marland Kitchen omeprazole (PRILOSEC) 40 MG capsule Take 40 mg by mouth as needed.    . rosuvastatin (CRESTOR) 10 MG tablet Take 1 tablet (10 mg total) by mouth daily at 6 PM. 30 tablet 2   No current facility-administered medications for this visit.     Allergies:   Atorvastatin   Social History:  The patient  reports that she has been smoking cigarettes. She has a 29.00 pack-year smoking history. She has never used smokeless tobacco. She reports current alcohol use of about 7.0 standard drinks of alcohol per week. She reports that she does not use drugs.   Family History:  The patient's family history includes Cancer in her mother, sister, and another family member; Hypertension in her mother; Varicose Veins in her  mother.  ROS:  Please see the history of present illness.  All other systems are reviewed and otherwise negative.   PHYSICAL EXAM:  VS:  There were no vitals taken for this visit. BMI: There is no height or weight on file to calculate BMI. GEN: Well nourished, well developed, in no acute distress  HEENT: normal  Neck: no JVD, carotid bruits, or masses Cardiac: RRR; no murmurs, rubs, or gallops,no edema  Respiratory:  clear to  auscultation bilaterally, normal work of breathing GI: soft, nontender, nondistended, + BS MS: no deformity or atrophy  Skin: warm and dry Neuro:  Strength and sensation are intact Psych: euthymic mood, full affect    EKG:  Done today and reviewed by myself shows SR 90bpm, baseline motion   05/18/2019: TEE IMPRESSIONS 1. Positive PFO with bidirectional flow - left to right shunt noted with color flow Doppler and right to left shunt noted with agitated saline. 2. No evidence of a thrombus present in the left atrial appendage. Normal emptying velocity 80 cm/s. 3. No left ventricular apical thrombus or LV mass. 4. The left ventricle has normal systolic function, with an ejection fraction of 60-65%. The cavity size was normal. Left ventricular diastolic function could not be evaluated. 5. The right ventricle has normal systolc function. The cavity was normal. There is no increase in right ventricular wall thickness. 6. Left atrial size was mildly dilated. 7. Mild sclerosis of the aortic valve, no aortic valve regurgitation. 8. The mitral valve is mildly degenerative with a possible tiny degenerative strand on the lateral and anterior aspect of the valve, atrial side. 9. The aortic root and ascending aorta are normal in size and structure. 10. There is evidence of moderate plaque in the aortic arch and descending aorta.  FINDINGS Left Ventricle: The left ventricle has normal systolic function, with an ejection fraction of 60-65%. The cavity size was normal. There is no increase in left ventricular wall thickness. Left ventricular diastolic function could not be evaluated.  Right Ventricle: The right ventricle has normal systolic function. The cavity was normal. There is no increase in right ventricular wall thickness.  Left Atrium: Left atrial size was mildly dilated.   Left Atrial Appendage: No evidence of a thrombus present in the left atrial appendage.  Right Atrium:  Right atrial size was normal in size. Right atrial pressure is estimated at 10 mmHg.  Interatrial Septum: Agitated saline contrast was given intravenously to evaluate for intracardiac shunting. Saline contrast bubble study was positive, with evidence of interatrial shunting.  Pericardium: There is no evidence of pericardial effusion.  Mitral Valve: The mitral valve is degenerative in appearance. Mitral valve regurgitation is trivial by color flow Doppler.  Tricuspid Valve: The tricuspid valve was normal in structure. Tricuspid valve regurgitation is trivial by color flow Doppler.  Aortic Valve: The aortic valve is tricuspid Mild sclerosis of the aortic valve. Aortic valve regurgitation was not visualized by color flow Doppler.  Pulmonic Valve: The pulmonic valve was normal in structure. Pulmonic valve regurgitation is trivial by color flow Doppler.  Aorta: The aortic root and ascending aorta are normal in size and structure. There is evidence of moderate plaque in the aortic arch and descending aorta.   05/17/2019: LE venous US Summary: Right: There is no evidence of deep vein thrombosis in the lower extremity. No cystic structure found in the popliteal fossa. Left: There is no evidence of deep vein thrombosis in the lower extremity. No cystic structure found in the popliteal  fossa.   05/16/2019: TTE IMPRESSIONS 1. The left ventricle has normal systolic function with an ejection fraction of 60-65%. The cavity size was normal. Left ventricular diastolic function could not be evaluated due to nondiagnostic images. No evidence of left ventricular regional wall  motion abnormalities.  2. The right ventricle has normal systolic function. The cavity was normal. There is no increase in right ventricular wall thickness. Right ventricular systolic pressure could not be assessed.  3. The aortic valve is tricuspid. Moderate sclerosis of the aortic valve.  4. Cannot rule out PFO by colorflow  doppler. Recommend agitated saline contrast injection.    Recent Labs: 05/15/2019: ALT 16; BUN 16; Creatinine, Ser 0.69; Hemoglobin 14.7; Platelets 191; Potassium 3.9; Sodium 133  05/17/2019: Cholesterol 215; HDL 46; LDL Cholesterol 139; Total CHOL/HDL Ratio 4.7; Triglycerides 148; VLDL 30   CrCl cannot be calculated (Patient's most recent lab result is older than the maximum 21 days allowed.).   Wt Readings from Last 3 Encounters:  08/09/19 128 lb (58.1 kg)  08/01/19 135 lb (61.2 kg)  06/26/19 135 lb (61.2 kg)     Other studies reviewed: Additional studies/records reviewed today include: summarized above  ASSESSMENT AND PLAN:  1. Cryptogenic stroke Plan for [facility] monitor today. Risks and benefits discussed. Risks include bleeding and infection. The patient understands the risks and has agreed to the procedure.   James Ivanoff, MD 08/10/2019 12:08 PM     [facility] [address] [employer] [place] [address] [phone] (office)  [phone] (fax)   SURGEON:  Allegra Lai, MD     PREPROCEDURE DIAGNOSIS:  Cryptogenic Stroke    POSTPROCEDURE DIAGNOSIS:  Cryptogenic Stroke     PROCEDURES:   1. Implantable loop recorder implantation    INTRODUCTION:  Latefa Hayhurst is a 78 y.o. female with a history of unexplained stroke who presents today for implantable loop implantation.  The patient has had a cryptogenic stroke.  Despite an extensive workup by neurology, no reversible causes have been identified.  she has worn telemetry during which she did not have arrhythmias.  There is significant concern for possible atrial fibrillation as the cause for the patients stroke.  The patient therefore presents today for implantable loop implantation.     DESCRIPTION OF PROCEDURE:  Informed written consent was obtained, and the patient was brought to the electrophysiology lab in a fasting state.  The patient required no sedation for the procedure today.   Mapping over the patient's chest was performed by the EP lab staff to identify the area where electrograms were most prominent for ILR recording.  This area was found to be the left parasternal region over the 3rd-4th intercostal space. The patients left chest was therefore prepped and draped in the usual sterile fashion by the EP lab staff. The skin overlying the left parasternal region was infiltrated with lidocaine for local analgesia.  A 0.5-cm incision was made over the left parasternal region over the 3rd intercostal space.  A subcutaneous ILR pocket was fashioned using a combination of sharp and blunt dissection.  A Medtronic Reveal Roundup model [id number] implantable loop recorder was then placed into the pocket  R waves were very prominent and measured 0.53mV. EBL<1 ml.  Steri- Strips and a sterile dressing were then applied.  There were no early apparent complications.     CONCLUSIONS:   1. Successful implantation of a Medtronic Reveal LINQ implantable loop recorder for cryptogenic stroke  2. No early  apparent complications.

## 2019-08-18 ENCOUNTER — Telehealth (INDEPENDENT_AMBULATORY_CARE_PROVIDER_SITE_OTHER): Payer: Medicare Other | Admitting: Student

## 2019-08-18 ENCOUNTER — Other Ambulatory Visit: Payer: Self-pay

## 2019-08-18 LAB — CUP PACEART INCLINIC DEVICE CHECK
Date Time Interrogation Session: 20200911121010
Implantable Pulse Generator Implant Date: 20200903

## 2019-08-18 NOTE — Progress Notes (Signed)
Virtual Visit via Telephone Note  I connected with [name] on 08/18/19 at 12:10 PM EDT by telephone and verified that I am speaking with the correct person using two identifiers.  Location: Patient: Jessica Harrell is a 78 y.o. female  Provider: Shirley Friar, PA-C    I discussed the limitations, risks, security and privacy concerns of performing an evaluation and management service by telephone and the availability of in person appointments. I also discussed with the patient that there may be a patient responsible charge related to this service. The patient expressed understanding and agreed to proceed.   History of Present Illness: Cryptogenic stroke s/p ILR 08/10/2019   Observations/Objective: Via video, pt showed ILR site. No obvious redness, drainage, or swelling. Daughter, who was present, confirmed this. Non-tender.    Reviewed daily report in [facility]. No episodes.   Assessment and Plan: Stable wound check.  Pt OK to shower and no further restrictions. Continue monthly reports. Follow up in office prn.   Follow Up Instructions:    I discussed the assessment and treatment plan with the patient. The patient was provided an opportunity to ask questions and all were answered. The patient agreed with the plan and demonstrated an understanding of the instructions.   The patient was advised to call back or seek an in-person evaluation if the symptoms worsen or if the condition fails to improve as anticipated.  I provided 10 minutes of non-face-to-face time during this encounter.   Shirley Friar, PA-C

## 2019-08-24 ENCOUNTER — Ambulatory Visit: Payer: Medicare Other

## 2019-08-29 ENCOUNTER — Other Ambulatory Visit: Payer: Self-pay | Admitting: Hematology and Oncology

## 2019-08-30 ENCOUNTER — Ambulatory Visit (HOSPITAL_COMMUNITY): Payer: Medicare Other | Admitting: Speech Pathology

## 2019-08-30 ENCOUNTER — Other Ambulatory Visit: Payer: Self-pay

## 2019-08-30 ENCOUNTER — Ambulatory Visit (HOSPITAL_COMMUNITY): Payer: Medicare Other | Admitting: Occupational Therapy

## 2019-08-30 ENCOUNTER — Encounter (HOSPITAL_COMMUNITY): Payer: Self-pay | Admitting: Physical Therapy

## 2019-08-30 ENCOUNTER — Ambulatory Visit (HOSPITAL_COMMUNITY): Payer: Medicare Other | Attending: Family Medicine | Admitting: Physical Therapy

## 2019-08-30 ENCOUNTER — Encounter (HOSPITAL_COMMUNITY): Payer: Self-pay | Admitting: Occupational Therapy

## 2019-08-30 ENCOUNTER — Encounter (HOSPITAL_COMMUNITY): Payer: Self-pay | Admitting: Speech Pathology

## 2019-08-30 DIAGNOSIS — R29898 Other symptoms and signs involving the musculoskeletal system: Secondary | ICD-10-CM | POA: Diagnosis present

## 2019-08-30 DIAGNOSIS — R471 Dysarthria and anarthria: Secondary | ICD-10-CM

## 2019-08-30 DIAGNOSIS — R2689 Other abnormalities of gait and mobility: Secondary | ICD-10-CM | POA: Insufficient documentation

## 2019-08-30 DIAGNOSIS — R29818 Other symptoms and signs involving the nervous system: Secondary | ICD-10-CM | POA: Insufficient documentation

## 2019-08-30 DIAGNOSIS — R41841 Cognitive communication deficit: Secondary | ICD-10-CM | POA: Insufficient documentation

## 2019-08-30 DIAGNOSIS — R278 Other lack of coordination: Secondary | ICD-10-CM | POA: Diagnosis present

## 2019-08-30 NOTE — Therapy (Signed)
Jessica Harrell, [place], [address] Phone: [phone]   Fax:  [phone]  Speech Language Pathology Evaluation  Patient Details  Name: Jessica Harrell MRN: [id number] Date of Birth: December 04, 1941 Referring Provider (SLP): Jessica Eriksson, MD   Encounter Date: 08/30/2019  End of Session - 08/30/19 1300    Visit Number  1    Number of Visits  9    Date for SLP Re-Evaluation  10/05/19    Authorization Type  [employer] Medicare    SLP Start Time  1125    SLP Stop Time   1215    SLP Time Calculation (min)  50 min    Activity Tolerance  Patient tolerated treatment well       Past Medical History:  Diagnosis Date  . Breast cancer ([place])   . Right foot pain Nov. 2014    Past Surgical History:  Procedure Laterality Date  . BREAST BIOPSY    . BUBBLE STUDY  05/18/2019   Procedure: BUBBLE STUDY;  Surgeon: Elouise Munroe, MD;  Location: [facility] ENDOSCOPY;  Service: Cardiology;;  . CATARACT EXTRACTION     both eyes  . EYE SURGERY Left   . MASTECTOMY W/ SENTINEL NODE BIOPSY Left 05/09/2019   Procedure: LEFT MASTECTOMY WITH LEFT AXILLARY SENTINEL LYMPH NODE BIOPSY;  Surgeon: Coralie Keens, MD;  Location: [place];  Service: General;  Laterality: Left;  . TEE WITHOUT CARDIOVERSION N/A 05/18/2019   Procedure: TRANSESOPHAGEAL ECHOCARDIOGRAM (TEE);  Surgeon: Elouise Munroe, MD;  Location: [place];  Service: Cardiology;  Laterality: N/A;    There were no vitals filed for this visit.  Subjective Assessment - 08/30/19 1250    Subjective  "I want to talk better."    Patient is accompained by:  Family member    Special Tests  MoCA Blind    Currently in Pain?  No/denies         SLP Evaluation [facility] - 08/30/19 1250      SLP Visit Information   SLP Received On  08/30/19    Referring Provider (SLP)  Jessica Eriksson, MD    Onset Date  05/16/2019    Medical Diagnosis  s/p CVA      Subjective   Subjective  "I want to talk better."     Patient/Family Stated Goal  talk better      General Information   HPI  [name] a 78 y.o.femalewith history of breast cancer s/p mastectomy on 05/09/2019 and smoker who was discharged on 05/10/2019, did well for 2 days then became weak and lethargic on 05/13/2019, developing confusion and decreased p.o. intake.  On 05/15/2019, she presented to [facility] ED with CT showing bilateral cerebellar and left occipital lobe hypoattenuation.  MRI showed multiple bilateral posterior circulation cerebellar infarcts in the occipital lobes bilaterally, left thalamus and left tectum.  MRI showed diffuse intracranial arthrosclerosis.  Transferred to [facility] for further evaluation and management.  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  2D echo showed an EF of 60 to 65% without cardiac source of embolus identified and negative bubble.  TEE positive for PFO and bidirectional shunt at rest but no evidence of vegetation or clots.  Lower extremity venous Doppler negative for DVT.  Recommended loop recorder for atrial fibrillation monitoring once complete healing of recent mastectomy wound and plans on following with cardiology outpatient.  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 Pt is now referred by her PCP, Dr. Kathryne Harrell, for outpatient services to address residual deficits blurred vision (increased difficulty close up), balance deficits, mild cognitive impairment and dysphagia.    Behavioral/Cognition  alert and cooperative    Mobility Status  in a wheelchair      Balance Screen   Has the patient fallen in the past 6 months  Yes    How many times?  5    Has the patient had a decrease in activity level because of a fear of falling?    No    Is the patient reluctant to leave their home because of a fear of falling?   No      Prior Functional Status   Cognitive/Linguistic Baseline  Within functional limits    Type of Home  House     Lives With  Spouse    Available Support  Family    Education  12th grade    Vocation  Retired      Associate Professor   Overall Cognitive Status  Impaired/Different from baseline    Area of Impairment  Orientation;Attention;Memory;Safety/judgement    Orientation Level  Time    General Comments  "1994"    Current Attention Level  Sustained    Memory  Decreased short-term memory    Memory Comments  4/5 immediate, 1/5 delayed recall; 5/5 with multiple choice cues    Safety/Judgement  Decreased awareness of safety   Pt has fallen 5x   Attention  Sustained    Sustained Attention  Impaired    Sustained Attention Impairment  Verbal basic;Functional basic    Memory  Impaired    Memory Impairment  Storage deficit;Retrieval deficit    Awareness  Impaired    Awareness Impairment  Emergent impairment    Problem Solving  Appears intact    Executive Function  Self Monitoring    Self Monitoring  Impaired    Self Monitoring Impairment  Verbal complex;Functional complex      Auditory Comprehension   Overall Auditory Comprehension  Appears within functional limits for tasks assessed    Yes/No Questions  Within Functional Limits    Commands  Within Functional Limits    Conversation  Complex    Interfering Components  Attention;Working Recruitment consultant  Exceptions to [place]   Reading Status  Impaired    Word level  76-100% accurate    Effective Techniques  Large print;Visual cueing      Expression   Primary Mode of Expression  Verbal      Verbal Expression   Overall Verbal Expression  Impaired    Initiation  No impairment    Automatic Speech  Name;Social Response    Level of Generative/Spontaneous Verbalization  Conversation     Repetition  No impairment    Naming  No impairment    Pragmatics  No impairment    Interfering Components  Speech intelligibility    Effective Techniques  Articulatory cues    Non-Verbal Means of Communication  Not applicable      Written Expression   Dominant Hand  Right    Written Expression  Not tested      Oral Motor/Sensory Function   Overall Oral Motor/Sensory Function  Appears within functional limits for tasks assessed      Motor Speech   Overall Motor Speech  Impaired    Respiration  Impaired    Level of Impairment  Sentence    Phonation  Normal    Resonance  Within functional limits    Articulation  Impaired    Level of Impairment  Sentence    Intelligibility  Intelligibility reduced    Word  75-100% accurate    Phrase  75-100% accurate    Sentence  75-100% accurate    Conversation  50-74% accurate    Motor Planning  Witnin functional limits    Motor Speech Errors  Aware;Unaware    Effective Techniques  Slow rate;Pause;Over-articulate    Phonation  WFL      Standardized Assessments   Standardized Assessments   Montreal Cognitive Assessment (MOCA)   MoCA blind   Montreal Cognitive Assessment (MOCA)   11/22          SLP Short Term Goals - 08/30/19 1302      SLP SHORT TERM GOAL #1   Title  Pt will implement memory strategies in functional therapy activities with 90% acc with min cues.    Baseline  25%    Time  4    Period  Weeks    Status  New    Target Date  10/05/19      SLP SHORT TERM GOAL #2   Title  Pt will complete selective and alternating attention tasks (moderately complex) with 90% acc and mi/mod cues.    Baseline  limited by visual deficits; distractable, 75% mod cues    Time  4    Period  Weeks    Status  New    Target Date  10/05/19      SLP SHORT TERM GOAL #3   Title  Pt will complete moderate-level thought organization and planning activities with 90% acc and min assist.    Baseline  mod assist    Time  4    Period  Weeks     Status  New    Target Date  10/05/19      SLP SHORT TERM GOAL #4   Title  Pt will increase speech intelligibility to 90% intelligible in conversations in 1:1 conversations with SLP with min prompts for strategies and cue for error awareness.    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 Pt was independent prior to her strokes and she is motivated to improve. Her primary goals are to improve her speech and to "see better". Pt is limited significantly by her visual deficits. She was an avid reader prior to her strokes and is unable to do so now. She lives at home with her husband of 51 years and her daughter, Jessica Harrell assists as needed. Pt will benefit from skilled SLP in order to address the  above impairments, maximize independence, and decrease burden of care     Speech Therapy Frequency  2x / week    Duration  4 weeks    Treatment/Interventions  SLP instruction and feedback;Compensatory strategies;Patient/family education;Compensatory techniques    Potential to Achieve Goals  Good    Potential Considerations  Ability to learn/carryover information    SLP Home Exercise Plan  Pt will be independent with HEP as assigned to facilitate carrover in home environment    Consulted and Agree with Plan of Care  Patient       Patient will benefit from skilled therapeutic intervention in order to improve the following deficits and impairments:   Cognitive communication deficit  Dysarthria and  anarthria    Problem List Patient Active Problem List   Diagnosis Date Noted  . Acute ischemic stroke ([place]) 05/15/2019  . Acute metabolic encephalopathy [id number]  . Encephalopathy   . History of left breast cancer 05/09/2019  . 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

## 2019-08-30 NOTE — Therapy (Signed)
[place], [place], [address] Phone: [phone]   Fax:  [phone]  Physical Therapy Evaluation  Patient Details  Name: Jessica Harrell MRN: [id number] Date of Birth: 09-28-1941 Referring Provider (PT): [name], MD   Encounter Date: 08/30/2019  PT End of Session - 08/30/19 1744    Visit Number  1    Number of Visits  24    Date for PT Re-Evaluation  10/25/19   Mini-reassess 09/27/19   Authorization Type  [employer] Medicare Visits based on Medical Necessity    Authorization Time Period  08/30/19 - 10/30/19    Authorization - Visit Number  1    Authorization - Number of Visits  10    PT Start Time  0950    PT Stop Time  1030    PT Time Calculation (min)  40 min    Equipment Utilized During Treatment  Gait belt    Activity Tolerance  Patient tolerated treatment well;No increased pain    Behavior During Therapy  WFL for tasks assessed/performed       Past Medical History:  Diagnosis Date  . Breast cancer ([place])   . Right foot pain Nov. 2014    Past Surgical History:  Procedure Laterality Date  . BREAST BIOPSY    . BUBBLE STUDY  05/18/2019   Procedure: BUBBLE STUDY;  Surgeon: Elouise Munroe, MD;  Location: [facility] ENDOSCOPY;  Service: Cardiology;;  . CATARACT EXTRACTION     both eyes  . EYE SURGERY Left   . MASTECTOMY W/ SENTINEL NODE BIOPSY Left 05/09/2019   Procedure: LEFT MASTECTOMY WITH LEFT AXILLARY SENTINEL LYMPH NODE BIOPSY;  Surgeon: Coralie Keens, MD;  Location: [place];  Service: General;  Laterality: Left;  . TEE WITHOUT CARDIOVERSION N/A 05/18/2019   Procedure: TRANSESOPHAGEAL ECHOCARDIOGRAM (TEE);  Surgeon: Elouise Munroe, MD;  Location: [place];  Service: Cardiology;  Laterality: N/A;    There were no vitals filed for this visit.   Subjective Assessment - 08/30/19 0957    Subjective  Patient had a left mastectomy on June 2 and then reported having had a stroke on May 14, 2019.  She stated that she went to [facility] initially then transferred to [facility] hospital. She went to rehab at the [facility] from June 15 to July 20. She then had Home health therapies including  PT/OT/SP. She reported when she started to have the stroke she noticed a feeling of tiredness over the weekend and then on Monday she reported she was slurring her speech. Patient's left side is weaker and she has a balance issue in which she tries to lean back. She reported the ataxia is more on the left than the right and more so when she is fatigued. She also has a vision issue which comes and goes. She reported that the mastectomy took care of the cancer. She reported her BP is fairly normal. 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 She has a heart monitor implant, but not a pacemaker.    Patient is accompained by:  Family member   Daughter: [place]   Pertinent History  CVA 05/14/19    Limitations  House hold activities;Standing;Walking    How long can you stand comfortably?  Requires assistance and support of walker    How long can you walk comfortably?  Requires assistance to walk with a walker 50 feet    Diagnostic  tests  MRI 05/16/19: Posterior cericulation involving cerebellum bilaterally occipital lobes bilateral thalamus and left tectum    Patient Stated Goals  Walk better    Currently in Pain?  No/denies         [facility] PT Assessment - 08/30/19 0001      Assessment   Medical Diagnosis  S/P CVA    Referring Provider (PT)  [name], MD    Onset Date/Surgical Date  05/14/19    Hand Dominance  Right    Next MD Visit  09/20/19    Prior Therapy  Yes 30 days at [facility], and 60 days of [facility] Therapies 2-3 times per week      Precautions   Precautions  Fall      Restrictions   Weight Bearing Restrictions  No      Balance Screen   Has the patient fallen in the past 6 months  Yes    How many times?  5    Has the patient had a decrease in  activity level because of a fear of falling?   Yes    Is the patient reluctant to leave their home because of a fear of falling?   No      Home Environment   Living Environment  Private residence    Living Arrangements  Spouse/significant other    Type of [place] entrance    [place] to live on main level with bedroom/bathroom    [place] - 2 wheels;Wheelchair - manual;Bedside commode;Grab bars - tub/shower;Grab bars - toilet      Prior Function   Level of Independence  Independent;Independent with basic ADLs    Vocation  Retired    Leisure  Walking      Cognition   Overall Cognitive Status  Impaired/Different from baseline      Observation/Other Assessments   Focus on Therapeutic Outcomes (FOTO)   60% limited      Coordination   Gross Motor Movements are Fluid and Coordinated  No    Finger Nose Finger Test  Impaired left side more than right    Heel Shin Test  Impaired left side more than right      ROM / Strength   AROM / PROM / Strength  Strength      Strength   Strength Assessment Site  Hip;Knee;Ankle    Right/Left Hip  Right;Left    Right Hip Flexion  4-/5    Right Hip Extension  4+/5    Right Hip ABduction  4+/5    Left Hip Flexion  4+/5    Left Hip Extension  4+/5    Left Hip ABduction  4+/5    Right/Left Knee  Right;Left    Right Knee Flexion  5/5    Right Knee Extension  5/5    Left Knee Flexion  5/5    Left Knee Extension  5/5    Right/Left Ankle  Right;Left    Right Ankle Dorsiflexion  4+/5    Left Ankle Dorsiflexion  4+/5      Transfers   Transfers  Sit to Stand    Sit to Stand  3: Mod assist    Sit to Stand Details  Tactile cues for initiation;Verbal cues for sequencing;Verbal cues for technique;Verbal cues for safe use of DME/AE    Comments  Using RW. Frequent attempts to initiate independently. Posterior lean once in standing  Ambulation/Gait   Assistive device  Rolling walker    Gait Pattern   Ataxic   Increased step length on the left side   Gait Comments  Ambulation from chair to mat table (4 feet) requiring moderate assistance for support cueing for anterior weight shift and for safe use of equipment and to reach back to sit down      Standardized Balance Assessment   Standardized Balance Assessment  Berg Balance Test      Berg Balance Test   Sit to Stand  Needs moderate or maximal assist to stand    Standing Unsupported  Unable to stand 30 seconds unassisted    Sitting with Back Unsupported but Feet Supported on Floor or Stool  Able to sit safely and securely 2 minutes    Stand to Sit  Needs assistance to sit    Transfers  Needs one person to assist    Standing Unsupported with Eyes Closed  Needs help to keep from falling    Standing Unsupported with Feet Together  Needs help to attain position and unable to hold for 15 seconds    From Standing, Reach Forward with Outstretched Arm  Loses balance while trying/requires external support    From Standing Position, Pick up Object from Floor  Unable to try/needs assist to keep balance    From Standing Position, Turn to Look Behind Over each Shoulder  Needs assist to keep from losing balance and falling    Turn 360 Degrees  Needs assistance while turning    Standing Unsupported, Alternately Place Feet on Step/Stool  Needs assistance to keep from falling or unable to try    Standing Unsupported, One Foot in [employer] balance while stepping or standing    Standing on One Leg  Unable to try or needs assist to prevent fall    Total Score  5                Objective measurements completed on examination: See above findings.              PT Education - 08/30/19 1743    Education Details  Examination findings, POC, and prognosis.    Person(s) Educated  Patient;Child(ren)   Daughter   Methods  Explanation    Comprehension  Verbalized understanding       PT Short Term Goals - 08/30/19 1746      PT SHORT  TERM GOAL #1   Title  Patient will be educated on HEP and report regular compliance to improve safety and independence with functional mobility.    Time  4    Period  Weeks    Status  New    Target Date  09/27/19        PT Long Term Goals - 08/30/19 1747      PT LONG TERM GOAL #1   Title  Patient will demonstrate ability to perform sit to stand transfer with modified independence to improve overall functional mobility.    Time  8    Period  Weeks    Status  New    Target Date  10/25/19      PT LONG TERM GOAL #2   Title  Patient will demonstrate ability to ambulate 100 feet with LRAD with no more than minimal assistance and minimal tactile and verbal cues for form.    Time  8    Period  Weeks    Status  New    Target Date  10/25/19      PT LONG TERM GOAL #3   Title  Patient will demonstrate improvement on the BBS of at least 6 points indicating improved balance and independence with functional mobility.    Time  8    Period  Weeks    Status  New    Target Date  10/25/19      PT LONG TERM GOAL #4   Title  Patient will demonstrate ability to correct standing posture and maintain standing balance for at least 1 minute with no more than minimal verbal cueing to improve independence with functional mobility.    Time  8    Period  Weeks    Status  New    Target Date  10/25/19             Plan - 08/30/19 1804    Clinical Impression Statement  Patient is a 78 year old female who presented to outpatient physical therapy with her daughter for an evaluation S/P an ischemic stroke on 05/14/19. Following discharge from hospital, patient did go to the [facility] for 30 days and HHPT for 60 days. Patient presents with significant coordination deficits noted with the finger to nose and heel to shin test as well as with ataxia with ambulation and decreased proprioceptive awareness. In addition, patient demonstrated decreased ability to initiate movements such as with sit to stands from  her wheelchair. Patient demonstrated good sitting balance, however demonstrated limitations in standing balance with a posterior lean requiring maximal verbal cues and tactile cues as well as assistance to cue patient to maintain standing balance. Patient would benefit from skilled physical therapy in order to address the abovementioned deficits and help patient return to her prior level of function.    Personal Factors and Comorbidities  Age;Comorbidity 1    Comorbidities  Hx Breast Cancer, S/P CVA    Examination-Activity Limitations  Bathing;Hygiene/Grooming;Squat;Stairs;Bend;Lift;Locomotion Level;Stand;Transfers;Dressing    Examination-Participation Restrictions  Cleaning;Shop;Laundry;Community Activity;Meal Prep    Stability/Clinical Decision Making  Evolving/Moderate complexity    Clinical Decision Making  High    Rehab Potential  Good    PT Frequency  3x / week    PT Duration  8 weeks    PT Treatment/Interventions  ADLs/Self Care Home Management;Aquatic Therapy;Cryotherapy;Electrical Stimulation;Moist Heat;DME Instruction;Gait training;Stair training;Functional mobility training;Therapeutic activities;Therapeutic exercise;Balance training;Neuromuscular re-education;Patient/family education;Manual techniques;Passive range of motion;Dry needling;Energy conservation;Taping    PT Next Visit Plan  Review Evaluation and goals, work on transfer training sit to stands with mirror, standing balance with mirror and tape a vertical line for cue to correc to this. Initiate home activities to work on coordination and balance such as seated stepping towards targets.    PT Home Exercise Plan  Initiate at first session    Consulted and Agree with Plan of Care  Patient;Family member/caregiver    Family Member Consulted  Daughter: [place]       Patient will benefit from skilled therapeutic intervention in order to improve the following deficits and impairments:  Abnormal gait, Impaired sensation, Improper  body mechanics, Decreased coordination, Decreased mobility, Postural dysfunction, Decreased activity tolerance, Decreased endurance, Decreased strength, Decreased balance, Decreased safety awareness, Difficulty walking, Impaired vision/preception  Visit Diagnosis: Other symptoms and signs involving the nervous system  Other lack of coordination  Other abnormalities of gait and mobility     Problem List Patient Active Problem List   Diagnosis Date Noted  . Acute ischemic stroke ([place]) 05/15/2019  . Acute metabolic encephalopathy [id number]  . Encephalopathy   .  History of left breast cancer 05/09/2019  . Malignant neoplasm of lower-inner quadrant of left breast in female, estrogen receptor positive ([place]) 04/26/2018   Clarene Critchley PT, DPT 6:09 PM, 08/30/19 [place], [place], [address] Phone: [phone]   Fax:  [phone]  Name: Jessica Harrell MRN: [id number] Date of Birth: 1941/05/04

## 2019-08-31 NOTE — Therapy (Signed)
[place], [place], [address] Phone: [phone]   Fax:  [phone]  Occupational Therapy Evaluation  Patient Details  Name: Jessica Harrell MRN: [id number] Date of Birth: 05-Dec-1941 Referring Provider (OT): Dr. Kathryne Eriksson   Encounter Date: 08/30/2019  OT End of Session - 08/30/19 1259    Visit Number  1    Number of Visits  8    Date for OT Re-Evaluation  09/29/19    Authorization Type  [employer] Medicare    Authorization Time Period  no visit limit    OT Start Time  1034    OT Stop Time  1111    OT Time Calculation (min)  37 min    Activity Tolerance  Patient tolerated treatment well    Behavior During Therapy  [facility] for tasks assessed/performed       Past Medical History:  Diagnosis Date  . Breast cancer ([place])   . Right foot pain Nov. 2014    Past Surgical History:  Procedure Laterality Date  . BREAST BIOPSY    . BUBBLE STUDY  05/18/2019   Procedure: BUBBLE STUDY;  Surgeon: Elouise Munroe, MD;  Location: [facility] ENDOSCOPY;  Service: Cardiology;;  . CATARACT EXTRACTION     both eyes  . EYE SURGERY Left   . MASTECTOMY W/ SENTINEL NODE BIOPSY Left 05/09/2019   Procedure: LEFT MASTECTOMY WITH LEFT AXILLARY SENTINEL LYMPH NODE BIOPSY;  Surgeon: Coralie Keens, MD;  Location: [place];  Service: General;  Laterality: Left;  . TEE WITHOUT CARDIOVERSION N/A 05/18/2019   Procedure: TRANSESOPHAGEAL ECHOCARDIOGRAM (TEE);  Surgeon: Elouise Munroe, MD;  Location: [place];  Service: Cardiology;  Laterality: N/A;    There were no vitals filed for this visit.  Subjective Assessment - 08/30/19 1248    Subjective   S: I can do some things myself.    Pertinent History  Pt is a 78 y/o female s/p CVA on 05/13/19- multiple bilateral posterior circulation cerebellar infarcts in the occipital lobes bilaterally, left thalamus and left tectum. 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 Pt was referred to occupational therapy for evaluation and treatment by Dr. Kathryne Eriksson.    Patient Stated Goals  To be able to use my computer again.    Currently in Pain?  No/denies        [facility] OT Assessment - 08/30/19 1032      Assessment   Medical Diagnosis  s/p CVA    Referring Provider (OT)  Dr. Kathryne Eriksson    Onset Date/Surgical Date  05/14/19    Hand Dominance  Right    Next MD Visit  09/20/19    Prior Therapy  Yes 30 days at [facility], and 60 days of [facility] Therapies 2-3 times per week      Precautions   Precautions  Fall      Restrictions   Weight Bearing Restrictions  No      Balance Screen   Has the patient fallen in the past 6 months  Yes    How many times?  5    Has the patient had a decrease in activity level because of a fear of falling?   No    Is the patient reluctant to leave their home because of a fear of falling?   No      Prior Function   Level of  Independence  Independent;Independent with basic ADLs    Vocation  Retired    Leisure  Walking      ADL   ADL comments  Pt requires set-up and supervision for ADLs and is primarily limited by visual deficits and LUE ataxia. Pt is unable to perform writing tasks and use the computer as she used to. Pt is having difficulty with eating tasks, she is able to scoop her food using right hand however when trying to help push food onto utensil with left she will knock food off. Has difficulty scooping due to visual deficits.        Written Expression   Dominant Hand  Right    Handwriting  Not legible      Vision - History   Baseline Vision  Wears glasses only for reading    Patient Visual Report  Overshooting;Other (comment)    Additional Comments  Pt with visual-perceptual deficits, blurring of vision at close range. Will be further assessed next session      [place] to be further assessed  at next visit.       Cognition   Overall Cognitive Status  Within Functional Limits for tasks assessed      Observation/Other Assessments   Focus on Therapeutic Outcomes (FOTO)   60% limited      Coordination   Gross Motor Movements are Fluid and Coordinated  No    Fine Motor Movements are Fluid and Coordinated  No    Coordination and Movement Description  Pt with LUE ataxia, difficulty controlling movements required for ADL completion    9 Hole Peg Test  --   unable to complete due to visual deficits   Box and Blocks  1 minute: left-14, right-16      ROM / Strength   AROM / PROM / Strength  Strength      Strength   Strength Assessment Site  Shoulder;Elbow;Hand    Right/Left Shoulder  Right;Left    Right Shoulder Flexion  5/5    Right Shoulder ABduction  5/5    Right Shoulder Internal Rotation  5/5    Right Shoulder External Rotation  5/5    Left Shoulder Flexion  5/5    Left Shoulder ABduction  5/5    Left Shoulder Internal Rotation  5/5    Left Shoulder External Rotation  5/5    Right/Left Elbow  Right;Left    Right Elbow Flexion  5/5    Right Elbow Extension  4+/5    Left Elbow Flexion  4/5    Left Elbow Extension  4/5    Right/Left hand  Right;Left    Right Hand Gross Grasp  Functional    Right Hand Grip (lbs)  45    Right Hand Lateral Pinch  9 lbs    Right Hand 3 Point Pinch  8 lbs    Left Hand Gross Grasp  Functional    Left Hand Grip (lbs)  35    Left Hand Lateral Pinch  9 lbs    Left Hand 3 Point Pinch  7 lbs                        OT Short Term Goals - 08/31/19 0748      OT SHORT TERM GOAL #1   Title  Pt will be provided with and educated on HEP to improve safety and independence in  ADL completion.    Time  4    Period  Weeks    Status  New    Target Date  09/30/19      OT SHORT TERM GOAL #2   Title  Pt will be educated on appropriate AE to improve independence in eating, handwriting, and self-care tasks to compensate for visual  deficits and ataxia.    Time  4    Period  Weeks    Status  New      OT SHORT TERM GOAL #3   Title  Pt will increase bilateral grip strength by 10# and pinch strength by 2# to improve ability to grasp and maintain hold on objects during functional tasks.    Time  4    Period  Weeks    Status  New      OT SHORT TERM GOAL #4   Title  Pt will improve handwriting legibility by 25%, using compensatory strategies as needed, to improve independence in signing checks.    Time  4    Period  Weeks    Status  New      OT SHORT TERM GOAL #5   Title  Pt will demonstrate improvement in fine motor coordination by achieving at least 20 blocks with bilateral hands during box and blocks test.    Time  4    Period  Weeks    Status  New               Plan - 08/30/19 1259    Clinical Impression Statement  A: Pt is a 78 y/o female s/p CVA on 05/13/19. Pt received acute OT services, 30 days rehab at [facility], and 60 days of [facility] services on discharge from SNF. Daughter is with pt today assisting with PLOF information, currently pt is limited in participation ADL completion due to LUE coordination deficits and visual deficits. Coordination is also somewhat limited due to vision.    OT Occupational Profile and History  Detailed Assessment- Review of Records and additional review of physical, cognitive, psychosocial history related to current functional performance    Occupational performance deficits (Please refer to evaluation for details):  ADL's;IADL's;Rest and Sleep;Leisure    Body Structure / Function / Physical Skills  ADL;Endurance;UE functional use;Vision;Body mechanics;Proprioception;Coordination;IADL;Strength    Rehab Potential  Good    Clinical Decision Making  Several treatment options, min-mod task modification necessary    Modification or Assistance to Complete Evaluation   Min-Moderate modification of tasks or assist with assess necessary to complete eval    OT Frequency  2x / week     OT Duration  4 weeks    OT Treatment/Interventions  Self-care/ADL training;Visual/perceptual remediation/compensation;Patient/family education;Therapeutic exercise;Manual Therapy;Therapeutic activities    Plan  P: Pt will benefit from skilled OT services to improve safety and independence in ADLs and functional task completion. Treatment plan: ADL training, grip and pinch strengthening, fine motor coordination activities, graphomotor skills training, AE education.  Next session: Assess vision and add appropriate goals, educate on AE for eating like a scoop plate to limit need for both hands when scooping    Consulted and Agree with Plan of Care  Patient       Patient will benefit from skilled therapeutic intervention in order to improve the following deficits and impairments:   Body Structure / Function / Physical Skills: ADL, Endurance, UE functional use, Vision, Body mechanics, Proprioception, Coordination, IADL, Strength       Visit Diagnosis: Other lack  of coordination  Other symptoms and signs involving the musculoskeletal system    Problem List Patient Active Problem List   Diagnosis Date Noted  . Acute ischemic stroke ([place]) 05/15/2019  . Acute metabolic encephalopathy [id number]  . Encephalopathy   . History of left breast cancer 05/09/2019  . Malignant neoplasm of lower-inner quadrant of left breast in female, estrogen receptor positive ([place]) 04/26/2018   Guadelupe Sabin, OTR/L  [phone] 08/31/2019, 7:56 AM  [place], [place], [address] Phone: [phone]   Fax:  [phone]  Name: Jessica Harrell MRN: [id number] Date of Birth: 1941/07/04

## 2019-09-01 ENCOUNTER — Encounter (HOSPITAL_COMMUNITY): Payer: Self-pay | Admitting: Specialist

## 2019-09-01 ENCOUNTER — Other Ambulatory Visit: Payer: Self-pay

## 2019-09-01 ENCOUNTER — Ambulatory Visit (HOSPITAL_COMMUNITY): Payer: Medicare Other | Admitting: Specialist

## 2019-09-01 DIAGNOSIS — R29898 Other symptoms and signs involving the musculoskeletal system: Secondary | ICD-10-CM

## 2019-09-01 DIAGNOSIS — R29818 Other symptoms and signs involving the nervous system: Secondary | ICD-10-CM

## 2019-09-01 DIAGNOSIS — R278 Other lack of coordination: Secondary | ICD-10-CM

## 2019-09-01 NOTE — Therapy (Signed)
Jessica Harrell, [place], [address] Phone: [phone]   Fax:  [phone]  Occupational Therapy Treatment  Patient Details  Name: Jessica Harrell MRN: [id number] Date of Birth: 1941-02-19 Referring Provider (OT): Dr. Kathryne Eriksson   Encounter Date: 09/01/2019  OT End of Session - 09/01/19 1555    Visit Number  2    Number of Visits  8    Date for OT Re-Evaluation  09/29/19    Authorization Type  UHC Medicare    Authorization Time Period  no visit limit    OT Start Time  1435    OT Stop Time  1520    OT Time Calculation (min)  45 min    Activity Tolerance  Patient tolerated treatment well    Behavior During Therapy  [facility] for tasks assessed/performed       Past Medical History:  Diagnosis Date  . Breast cancer ([place])   . Right foot pain Nov. 2014    Past Surgical History:  Procedure Laterality Date  . BREAST BIOPSY    . BUBBLE STUDY  05/18/2019   Procedure: BUBBLE STUDY;  Surgeon: Elouise Munroe, MD;  Location: [facility] ENDOSCOPY;  Service: Cardiology;;  . CATARACT EXTRACTION     both eyes  . EYE SURGERY Left   . MASTECTOMY W/ SENTINEL NODE BIOPSY Left 05/09/2019   Procedure: LEFT MASTECTOMY WITH LEFT AXILLARY SENTINEL LYMPH NODE BIOPSY;  Surgeon: Coralie Keens, MD;  Location: [place];  Service: General;  Laterality: Left;  . TEE WITHOUT CARDIOVERSION N/A 05/18/2019   Procedure: TRANSESOPHAGEAL ECHOCARDIOGRAM (TEE);  Surgeon: Elouise Munroe, MD;  Location: [place];  Service: Cardiology;  Laterality: N/A;    There were no vitals filed for this visit.  Subjective Assessment - 09/01/19 1538    Subjective   S:  I wear reading glasses.    Currently in Pain?  No/denies         [facility] OT Assessment - 09/01/19 0001      Assessment   Medical Diagnosis  s/p CVA    Referring Provider (OT)  Dr. Kathryne Eriksson      Precautions   Precautions  Fall      Vision Assessment   Comment  continue assessment at  next visit.  Completed ER Cancellation Test:  patient unable to identify ER on entire page, or with all but one line covered.  Assessed functional vision at reading and conversation level.  Patient with best vision when object centered in front of her.  On house template she can identify roof, chimney, and doorknob (stated clock).  With cuing able to point to 3 windows and door.  Conversation distance:  able to identify nose, chest, eyebrows.  with cuing able to identify ears and eyes.                 OT Treatments/Exercises (OP) - 09/01/19 0001      ADLs   Eating  educated patient and family on adaptive equipment for improved accuracy and independene with self feeding including a scoop plate with suction bottom and swivel utensil.    Leisure  Patient enjoys crossword puzzles and reading.  Suggested audio books, Golden [employer] or overdrive app for patients IPAD, as well as podcasts for her ipad.  patient and family agreeable to suggestions.     ADL Comments  reviewed compensatory techniques for vision loss including the importance of ample lighting, possibility of changing bulbs, changing light fixture, and  use of contrast.  Patient and family receptive to these ideas and handouts issued.               OT Education - 09/01/19 1554    Education Details  provided information on compensatory techniques for improved function with limited vision.  hanouts reviewed concerned contrast and lighting.  Additionally, educated patient on use of scoop plate, swivel utensil for improved independence while eating, as well as information on podcasts, audiobooks for improved satisfaction with leisure outlets.    Person(s) Educated  Patient;Spouse;Child(ren)    Methods  Explanation;Demonstration;Handout    Comprehension  Verbalized understanding;Returned demonstration       OT Short Term Goals - 09/01/19 1600      OT SHORT TERM GOAL #1   Title  Pt will be provided with and educated on HEP to improve  safety and independence in ADL completion.    Time  4    Period  Weeks    Status  On-going    Target Date  09/30/19      OT SHORT TERM GOAL #2   Title  Pt will be educated on appropriate AE to improve independence in eating, handwriting, and self-care tasks to compensate for visual deficits and ataxia.    Time  4    Period  Weeks    Status  On-going      OT SHORT TERM GOAL #3   Title  Pt will increase bilateral grip strength by 10# and pinch strength by 2# to improve ability to grasp and maintain hold on objects during functional tasks.    Time  4    Period  Weeks    Status  On-going      OT SHORT TERM GOAL #4   Title  Pt will improve handwriting legibility by 25%, using compensatory strategies as needed, to improve independence in signing checks.    Time  4    Period  Weeks    Status  On-going      OT SHORT TERM GOAL #5   Title  Pt will demonstrate improvement in fine motor coordination by achieving at least 20 blocks with bilateral hands during box and blocks test.    Time  4    Period  Weeks    Status  On-going               Plan - 09/01/19 1556    Clinical Impression Statement  A:  Completed visual scanning assessment ER Cancellation Test.  Patient unable to successfully locate E or R when viewing entire page.  Therapist blocked all but top line and patient was still unable to correctly identify and select ER.  Assessed functional vision at reading and conversation distance, patient with limitations at both distances.  Provided education to patient and her family and techniques to compensate for decreased vision and improve independence with feeding.    Body Structure / Function / Physical Skills  ADL;Endurance;UE functional use;Vision;Body mechanics;Proprioception;Coordination;IADL;Strength    Plan  P:  Continue to assess vision and add approprate goals.  Follow up on obtainment of scoop plate.  Trial swivel spoon in clinic.       Patient will benefit from skilled  therapeutic intervention in order to improve the following deficits and impairments:   Body Structure / Function / Physical Skills: ADL, Endurance, UE functional use, Vision, Body mechanics, Proprioception, Coordination, IADL, Strength       Visit Diagnosis: Other lack of coordination  Other symptoms and signs involving  the nervous system  Other symptoms and signs involving the musculoskeletal system    Problem List Patient Active Problem List   Diagnosis Date Noted  . Acute ischemic stroke ([place]) 05/15/2019  . Acute metabolic encephalopathy [id number]  . Encephalopathy   . History of left breast cancer 05/09/2019  . Malignant neoplasm of lower-inner quadrant of left breast in female, estrogen receptor positive ([place]) 04/26/2018    Vangie Bicker, , OTR/L [phone]  09/01/2019, 4:09 PM  [place], [place], [address] Phone: [phone]   Fax:  [phone]  Name: Adda Hund MRN: [id number] Date of Birth: [birth year]

## 2019-09-04 ENCOUNTER — Other Ambulatory Visit: Payer: Self-pay

## 2019-09-04 ENCOUNTER — Ambulatory Visit (HOSPITAL_COMMUNITY): Payer: Medicare Other

## 2019-09-04 ENCOUNTER — Encounter (HOSPITAL_COMMUNITY): Payer: Self-pay

## 2019-09-04 ENCOUNTER — Encounter (HOSPITAL_COMMUNITY): Payer: Self-pay | Admitting: Speech Pathology

## 2019-09-04 ENCOUNTER — Ambulatory Visit (HOSPITAL_COMMUNITY): Payer: Medicare Other | Admitting: Speech Pathology

## 2019-09-04 DIAGNOSIS — R41841 Cognitive communication deficit: Secondary | ICD-10-CM

## 2019-09-04 DIAGNOSIS — R471 Dysarthria and anarthria: Secondary | ICD-10-CM

## 2019-09-04 DIAGNOSIS — R2689 Other abnormalities of gait and mobility: Secondary | ICD-10-CM

## 2019-09-04 DIAGNOSIS — R29818 Other symptoms and signs involving the nervous system: Secondary | ICD-10-CM | POA: Diagnosis not present

## 2019-09-04 DIAGNOSIS — R278 Other lack of coordination: Secondary | ICD-10-CM

## 2019-09-04 NOTE — Therapy (Signed)
[place], [place], [address] Phone: [phone]   Fax:  [phone]  Speech Language Pathology Treatment  Patient Details  Name: Jessica Harrell MRN: [id number] Date of Birth: 04-26-1941 Referring Provider (SLP): Kathryne Eriksson, MD   Encounter Date: 09/04/2019  End of Session - 09/04/19 1038    Visit Number  2    Number of Visits  9    Date for SLP Re-Evaluation  10/05/19    Authorization Type  UHC Medicare    SLP Start Time  0912    SLP Stop Time   0952    SLP Time Calculation (min)  40 min    Activity Tolerance  Patient tolerated treatment well       Past Medical History:  Diagnosis Date  . Breast cancer ([place])   . Right foot pain Nov. 2014    Past Surgical History:  Procedure Laterality Date  . BREAST BIOPSY    . BUBBLE STUDY  05/18/2019   Procedure: BUBBLE STUDY;  Surgeon: Elouise Munroe, MD;  Location: [facility] ENDOSCOPY;  Service: Cardiology;;  . CATARACT EXTRACTION     both eyes  . EYE SURGERY Left   . MASTECTOMY W/ SENTINEL NODE BIOPSY Left 05/09/2019   Procedure: LEFT MASTECTOMY WITH LEFT AXILLARY SENTINEL LYMPH NODE BIOPSY;  Surgeon: Coralie Keens, MD;  Location: [place];  Service: General;  Laterality: Left;  . TEE WITHOUT CARDIOVERSION N/A 05/18/2019   Procedure: TRANSESOPHAGEAL ECHOCARDIOGRAM (TEE);  Surgeon: Elouise Munroe, MD;  Location: [place];  Service: Cardiology;  Laterality: N/A;    There were no vitals filed for this visit.  Subjective Assessment - 09/04/19 1036    Subjective  "Jessica Harrell"    Patient is accompained by:  Family member    Currently in Pain?  No/denies       ADULT SLP TREATMENT - 09/04/19 0001      General Information   Behavior/Cognition  Alert;Cooperative;Pleasant mood    Patient Positioning  Upright in chair    Oral care provided  N/A    HPI  [name] is a 78 y.o. female with history of breast cancer s/p mastectomy on 05/09/2019 and  smoker who was discharged on 05/10/2019, did well for 2 days then became weak and lethargic on 05/13/2019, developing confusion and decreased p.o. intake.  On 05/15/2019, she presented to [facility] ED with CT showing bilateral cerebellar and left occipital lobe hypoattenuation.  MRI showed multiple bilateral posterior circulation cerebellar infarcts in the occipital lobes bilaterally, left thalamus and left tectum.  MRI showed diffuse intracranial arthrosclerosis.  Transferred to [facility] for further evaluation and management.  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  2D echo showed an EF of 60 to 65% without cardiac source of embolus identified and negative bubble.  TEE positive for PFO and bidirectional shunt at rest but no evidence of vegetation or clots.  Lower extremity venous Doppler negative for DVT.  Recommended loop recorder for atrial fibrillation monitoring once complete healing of recent mastectomy wound and plans on following with cardiology outpatient.  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 Pt is now referred by her PCP, Dr. Kathryne Eriksson, for outpatient services to address residual  deficits blurred vision (increased difficulty close up), balance deficits, mild cognitive impairment and dysphagia.      Treatment Provided   Treatment provided  Cognitive-Linquistic      Pain Assessment   Pain Assessment  No/denies pain      Cognitive-Linquistic Treatment   Treatment focused on  Dysarthria;Cognition;Patient/family/caregiver education    Skilled Treatment  Skilled SLP provided via Pt/family education regarding goals. Pt's husband accompanied her to therapy this date along with their daughter. SLP provided mod verbal cues for speech  intelligibility strategies, primarily to decrease rate and add pauses between words. SLP also provided hypothetical problem solving and planning scenarios related to Pt's schedule on days with therapy. Pt required mi/mod cues to back track from appointment time to arrange appropriate schedule at home.      Assessment / Recommendations / Plan   Plan  Continue with current plan of care      Progression Toward Goals   Progression toward goals  Progressing toward goals       SLP Education - 09/04/19 1208    Education Details  Encouraged family to try some basic card sorting tasks with UNO cards or standard cards for visual attention tasks at home    Person(s) Educated  Patient;Spouse;Child(ren)    Methods  Explanation    Comprehension  Verbalized understanding       SLP Short Term Goals - 09/04/19 1039      SLP SHORT TERM GOAL #1   Title  Pt will implement memory strategies in functional therapy activities with 90% acc with min cues.    Baseline  25%    Time  4    Period  Weeks    Status  On-going    Target Date  10/05/19      SLP SHORT TERM GOAL #2   Title  Pt will complete selective and alternating attention tasks (moderately complex) with 90% acc and mi/mod cues.    Baseline  limited by visual deficits; distractable, 75% mod cues    Time  4    Period  Weeks    Status  On-going    Target Date  10/05/19      SLP SHORT TERM GOAL #3   Title  Pt will complete moderate-level thought organization and planning activities with 90% acc and min assist.    Baseline  mod assist    Time  4    Period  Weeks    Status  On-going    Target Date  10/05/19      SLP SHORT TERM GOAL #4   Title  Pt will increase speech intelligibility to 90% intelligible in conversations in 1:1 conversations with SLP with min prompts for strategies and cue for error awareness.    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 Pt attentive during session, however noted to become distracted at  times (picking at items on her clothing and pulling at her mask). Next session, plan to address attention skills during UNO card sorting and scanning task.    Speech Therapy Frequency  2x / week    Duration  4 weeks    Treatment/Interventions  SLP instruction and feedback;Compensatory strategies;Patient/family education;Compensatory techniques    Potential to Achieve Goals  Good    Potential Considerations  Ability to learn/carryover information    SLP Home Exercise Plan  Pt will be independent with HEP as assigned to facilitate carrover in home environment    Consulted and Agree with Plan of Care  Patient       Patient will benefit from skilled therapeutic intervention in order to improve the following deficits and impairments:   Cognitive communication deficit  Dysarthria and anarthria    Problem List Patient Active Problem List   Diagnosis Date Noted  . Acute ischemic stroke ([place]) 05/15/2019  . Acute metabolic encephalopathy [id number]  . Encephalopathy   . History of left breast cancer 05/09/2019  . Malignant neoplasm of lower-inner quadrant of left breast in female, estrogen receptor positive [facility]) 04/26/2018   Thank you,  Genene Churn, [place]  [facility] 09/04/2019, 12:10 PM  [place] [address] [place], [place], [address] Phone: [phone]   Fax:  [phone]   Name: Jessica Harrell MRN: [id number] Date of  Birth: 1941-03-06

## 2019-09-04 NOTE — Therapy (Signed)
[place], [place], [address] Phone: [phone]   Fax:  [phone]  Physical Therapy Treatment  Patient Details  Name: Jessica Harrell MRN: [id number] Date of Birth: [birth year] Referring Provider (PT): [name], MD   Encounter Date: 09/04/2019  PT End of Session - 09/04/19 0940    Visit Number  2    Number of Visits  24    Date for PT Re-Evaluation  10/25/19   Mini-reassess 09/27/19   Authorization Type  UHC Medicare Visits based on Medical Necessity    Authorization Time Period  08/30/19 - 10/30/19    Authorization - Visit Number  2    Authorization - Number of Visits  10    PT Start Time  0955    PT Stop Time  1025    PT Time Calculation (min)  30 min    Equipment Utilized During Treatment  --    Activity Tolerance  Patient tolerated treatment well;No increased pain    Behavior During Therapy  WFL for tasks assessed/performed       Past Medical History:  Diagnosis Date  . Breast cancer ([place])   . Right foot pain Nov. 2014    Past Surgical History:  Procedure Laterality Date  . BREAST BIOPSY    . BUBBLE STUDY  05/18/2019   Procedure: BUBBLE STUDY;  Surgeon: Elouise Munroe, MD;  Location: [facility] ENDOSCOPY;  Service: Cardiology;;  . CATARACT EXTRACTION     both eyes  . EYE SURGERY Left   . MASTECTOMY W/ SENTINEL NODE BIOPSY Left 05/09/2019   Procedure: LEFT MASTECTOMY WITH LEFT AXILLARY SENTINEL LYMPH NODE BIOPSY;  Surgeon: Coralie Keens, MD;  Location: [place];  Service: General;  Laterality: Left;  . TEE WITHOUT CARDIOVERSION N/A 05/18/2019   Procedure: TRANSESOPHAGEAL ECHOCARDIOGRAM (TEE);  Surgeon: Elouise Munroe, MD;  Location: [place];  Service: Cardiology;  Laterality: N/A;    There were no vitals filed for this visit.  Subjective Assessment - 09/04/19 0957    Subjective  Pt reports no new news. Denies pain.    Patient is accompained by:  Family member   Daughter: [place]    Pertinent History  CVA 05/14/19    Limitations  House hold activities;Standing;Walking    How long can you stand comfortably?  Requires assistance and support of walker    How long can you walk comfortably?  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  No/denies            [facility] Adult PT Treatment/Exercise - 09/04/19 0001      Transfers   Transfers  Sit to Stand    Sit to Stand  3: Mod assist    Comments  in //bars, min guard assist; with RW in front of mirror, mod assist; with bed sheet under bottom aiding in glute activation, mod assist      Exercises   Exercises  Knee/Hip      Knee/Hip Exercises: Standing   Heel Raises  10 reps   // bars for BUE assist   Heel Raises Limitations  toe raises, 10 reps, decreased range    Hip Flexion  10 reps    Hip Flexion Limitations  // bars for BUE assist  Knee/Hip Exercises: Seated   Other Seated Knee/Hip Exercises  flat foot taps to sticky  note targets laterally, x15 reps each side             PT Education - 09/04/19 0957    Education Details  Reviewed goals, assessment findings, initaited HEP, STS transfer technique to encourage anterior weight shifting    Person(s) Educated  Patient    Methods  Explanation;Demonstration    Comprehension  Verbalized understanding;Returned demonstration       PT Short Term Goals - 09/04/19 0958      PT SHORT TERM GOAL #1   Title  Patient will be educated on HEP and report regular compliance to improve safety and independence with functional mobility.    Time  4    Period  Weeks    Status  On-going    Target Date  09/27/19        PT Long Term Goals - 09/04/19 0959      PT LONG TERM GOAL #1   Title  Patient will demonstrate ability to perform sit to stand transfer with  modified independence to improve overall functional mobility.    Time  8    Period  Weeks    Status  On-going      PT LONG TERM GOAL #2   Title  Patient will demonstrate ability to ambulate 100 feet with LRAD with no more than minimal assistance and minimal tactile and verbal cues for form.    Time  8    Period  Weeks    Status  On-going      PT LONG TERM GOAL #3   Title  Patient will demonstrate improvement on the BBS of at least 6 points indicating improved balance and independence with functional mobility.    Time  8    Period  Weeks    Status  On-going      PT LONG TERM GOAL #4   Title  Patient will demonstrate ability to correct standing posture and maintain standing balance for at least 1 minute with no more than minimal verbal cueing to improve independence with functional mobility.    Time  8    Period  Weeks    Status  On-going            Plan - 09/04/19 1223    Clinical Impression Statement  Began treatment session with reviewing evaluation and goals. Pt initially able to rise from [facility] easily with STS reps in parallel bars, but maintains weight posterior through heels. Transfer training in front of mirror for visual feedback to improve normal postural alignment and with RW for UE assist, mod assist with verbal cues for bringing hips forward, "nose over toes" pushing through bil flat feet, coming to hug therapist to encourage anterior weight shift; pt continues to demonstrate hip extension last with transfers and maintains weight through heels once rising requiring mod assist to maintain upright. Pt tolerates standing for 4 minutes to perform standing exercises with BUE assist on parallel bars and min guard assist from therapist. Added sheet to assist in engaging glutes and hip extension with STS transfer which pt able to perform with mod assist, improving to min assist with reps. Pt able to perform seated foot taps to sticky note target, missing target 50% of the time or  stepping on other foot when returning to starting position. Added target steps with sticky notes in sitting to HEP and daughter and spouse verbalized understanding. Continue to progress  as able.    Personal Factors and Comorbidities  Age;Comorbidity 1    Comorbidities  Hx Breast Cancer, S/P CVA    Examination-Activity Limitations  Bathing;Hygiene/Grooming;Squat;Stairs;Bend;Lift;Locomotion Level;Stand;Transfers;Dressing    Examination-Participation Restrictions  Cleaning;Shop;Laundry;Community Activity;Meal Prep    Stability/Clinical Decision Making  Evolving/Moderate complexity    Rehab Potential  Good    PT Frequency  3x / week    PT Duration  8 weeks    PT Treatment/Interventions  ADLs/Self Care Home Management;Aquatic Therapy;Cryotherapy;Electrical Stimulation;Moist Heat;DME Instruction;Gait training;Stair training;Functional mobility training;Therapeutic activities;Therapeutic exercise;Balance training;Neuromuscular re-education;Patient/family education;Manual techniques;Passive range of motion;Dry needling;Energy conservation;Taping    PT Next Visit Plan  STS training to shift weight anterior and bil hip extension, standing balance with mirror and tape a vertical line for cue to correc to this.    PT Home Exercise Plan  9/28: seated stepping towards sticky note targets for coordination    Consulted and Agree with Plan of Care  Patient;Family member/caregiver    Family Member Consulted  [place] (daughter) and spouse       Patient will benefit from skilled therapeutic intervention in order to improve the following deficits and impairments:  Abnormal gait, Impaired sensation, Improper body mechanics, Decreased coordination, Decreased mobility, Postural dysfunction, Decreased activity tolerance, Decreased endurance, Decreased strength, Decreased balance, Decreased safety awareness, Difficulty walking, Impaired vision/preception  Visit Diagnosis: Other symptoms and signs involving the nervous  system  Other lack of coordination  Other abnormalities of gait and mobility     Problem List Patient Active Problem List   Diagnosis Date Noted  . Acute ischemic stroke ([place]) 05/15/2019  . Acute metabolic encephalopathy [id number]  . Encephalopathy   . History of left breast cancer 05/09/2019  . Malignant neoplasm of lower-inner quadrant of left breast in female, estrogen receptor positive ([place]) 04/26/2018     Talbot Grumbling PT, DPT 09/04/19, 12:27 PM [place], [place], [address] Phone: [phone]   Fax:  [phone]  Name: Belenda Heikkinen MRN: [id number] Date of Birth: Dec 14, 1940

## 2019-09-05 ENCOUNTER — Ambulatory Visit (HOSPITAL_COMMUNITY): Payer: Medicare Other | Admitting: Occupational Therapy

## 2019-09-05 ENCOUNTER — Encounter (HOSPITAL_COMMUNITY): Payer: Self-pay | Admitting: Occupational Therapy

## 2019-09-05 DIAGNOSIS — R29818 Other symptoms and signs involving the nervous system: Secondary | ICD-10-CM | POA: Diagnosis not present

## 2019-09-05 DIAGNOSIS — R278 Other lack of coordination: Secondary | ICD-10-CM

## 2019-09-05 DIAGNOSIS — R29898 Other symptoms and signs involving the musculoskeletal system: Secondary | ICD-10-CM

## 2019-09-06 ENCOUNTER — Encounter (HOSPITAL_COMMUNITY): Payer: Self-pay

## 2019-09-06 ENCOUNTER — Ambulatory Visit (HOSPITAL_COMMUNITY): Payer: Medicare Other | Admitting: Speech Pathology

## 2019-09-06 ENCOUNTER — Ambulatory Visit (HOSPITAL_COMMUNITY): Payer: Medicare Other

## 2019-09-06 ENCOUNTER — Other Ambulatory Visit: Payer: Self-pay

## 2019-09-06 ENCOUNTER — Encounter (HOSPITAL_COMMUNITY): Payer: Self-pay | Admitting: Speech Pathology

## 2019-09-06 DIAGNOSIS — R471 Dysarthria and anarthria: Secondary | ICD-10-CM

## 2019-09-06 DIAGNOSIS — R29818 Other symptoms and signs involving the nervous system: Secondary | ICD-10-CM

## 2019-09-06 DIAGNOSIS — R278 Other lack of coordination: Secondary | ICD-10-CM

## 2019-09-06 DIAGNOSIS — R41841 Cognitive communication deficit: Secondary | ICD-10-CM

## 2019-09-06 DIAGNOSIS — R2689 Other abnormalities of gait and mobility: Secondary | ICD-10-CM

## 2019-09-06 NOTE — Therapy (Signed)
[place], [place], [address] Phone: [phone]   Fax:  [phone]  Occupational Therapy Treatment  Patient Details  Name: Jessica Harrell MRN: [id number] Date of Birth: [birth year] Referring Provider (OT): Dr. Kathryne Eriksson   Encounter Date: 09/05/2019  OT End of Session - 09/05/19 1744    Visit Number  3    Number of Visits  8    Date for OT Re-Evaluation  09/29/19    Authorization Type  UHC Medicare    Authorization Time Period  no visit limit    OT Start Time  1648    OT Stop Time  1728    OT Time Calculation (min)  40 min    Activity Tolerance  Patient tolerated treatment well    Behavior During Therapy  [facility] for tasks assessed/performed       Past Medical History:  Diagnosis Date  . Breast cancer ([place])   . Right foot pain Nov. 2014    Past Surgical History:  Procedure Laterality Date  . BREAST BIOPSY    . BUBBLE STUDY  05/18/2019   Procedure: BUBBLE STUDY;  Surgeon: Elouise Munroe, MD;  Location: [facility] ENDOSCOPY;  Service: Cardiology;;  . CATARACT EXTRACTION     both eyes  . EYE SURGERY Left   . MASTECTOMY W/ SENTINEL NODE BIOPSY Left 05/09/2019   Procedure: LEFT MASTECTOMY WITH LEFT AXILLARY SENTINEL LYMPH NODE BIOPSY;  Surgeon: Coralie Keens, MD;  Location: [place];  Service: General;  Laterality: Left;  . TEE WITHOUT CARDIOVERSION N/A 05/18/2019   Procedure: TRANSESOPHAGEAL ECHOCARDIOGRAM (TEE);  Surgeon: Elouise Munroe, MD;  Location: [place];  Service: Cardiology;  Laterality: N/A;    There were no vitals filed for this visit.  Subjective Assessment - 09/05/19 1648    Subjective   S: I've been ok.    Currently in Pain?  No/denies         [facility] OT Assessment - 09/05/19 1648      Assessment   Medical Diagnosis  s/p CVA      Precautions   Precautions  Fall               OT Treatments/Exercises (OP) - 09/05/19 1733      ADLs   ADL Comments  Educated on use  of plate guard for eating and improvement in spillage.       Visual/Perceptual Exercises   Maze  Pt completed 2 pencil control path/mazes (camping theme). For first trial OT cued pt to find bear first, then find the basket. Once pt pointed to each object, OT provided pen and asked pt to trace along the path. Pt was unable to trace along path, instead drawing line straight from bear to basket. On second trial OT cut out maze and placed against dark purple paper, cued pt to find the kids and then the house. Pt unable to follow maze that went to the right. OT then guided pt through the maze with elbow off table, pt with greater sucess.     Other Exercises  Pt completing pinch tree activity today working on vision and visual-motor integration. Black board placed behind pinch tree and pt using all colors of clothespins. Pt with 50% sucess initially, improving throughout session with strategies such as look all the way left/right, using other hand to find bar then placing pins. OT educated on placing a marker at a far edge of an activity or page when working on  home exercises for a reference point. Pt also working on hand gripper task with beads placed on white towel. Pt was able to see all beads with exception of yellow bead.      Neurological Re-education Exercises   Hand Gripper with Medium Beads  6 beads gripper set at 20#               OT Short Term Goals - 09/01/19 1600      OT SHORT TERM GOAL #1   Title  Pt will be provided with and educated on HEP to improve safety and independence in ADL completion.    Time  4    Period  Weeks    Status  On-going    Target Date  09/30/19      OT SHORT TERM GOAL #2   Title  Pt will be educated on appropriate AE to improve independence in eating, handwriting, and self-care tasks to compensate for visual deficits and ataxia.    Time  4    Period  Weeks    Status  On-going      OT SHORT TERM GOAL #3   Title  Pt will increase bilateral grip strength  by 10# and pinch strength by 2# to improve ability to grasp and maintain hold on objects during functional tasks.    Time  4    Period  Weeks    Status  On-going      OT SHORT TERM GOAL #4   Title  Pt will improve handwriting legibility by 25%, using compensatory strategies as needed, to improve independence in signing checks.    Time  4    Period  Weeks    Status  On-going      OT SHORT TERM GOAL #5   Title  Pt will demonstrate improvement in fine motor coordination by achieving at least 20 blocks with bilateral hands during box and blocks test.    Time  4    Period  Weeks    Status  On-going               Plan - 09/05/19 1744    Clinical Impression Statement  A: Session focusing on visual perception and visual motor integration as well as incoporating compensatory strategies into tasks. Pt has max difficulty with tracing task due to posterior push on table impacting control, and with visual scanning required for task. Pt did well with white background and dark beads during handgripper activity. Pt's daughter reports they purchased red plates which has improved pt's success with scooping.    Body Structure / Function / Physical Skills  ADL;Endurance;UE functional use;Vision;Body mechanics;Proprioception;Coordination;IADL;Strength    Plan  P: Continue working on vision and compensatory strategies for task completion       Patient will benefit from skilled therapeutic intervention in order to improve the following deficits and impairments:   Body Structure / Function / Physical Skills: ADL, Endurance, UE functional use, Vision, Body mechanics, Proprioception, Coordination, IADL, Strength       Visit Diagnosis: Other lack of coordination  Other symptoms and signs involving the musculoskeletal system    Problem List Patient Active Problem List   Diagnosis Date Noted  . Acute ischemic stroke ([place]) 05/15/2019  . Acute metabolic encephalopathy [id number]  .  Encephalopathy   . History of left breast cancer 05/09/2019  . Malignant neoplasm of lower-inner quadrant of left breast in female, estrogen receptor positive ([place]) 04/26/2018   Guadelupe Sabin, OTR/L  [phone] 09/06/2019, 7:48  [facility] [address] [place], [place], [address] Phone: [phone]   Fax:  [phone]  Name: Jessica Harrell MRN: [id number] Date of Birth: 1941/06/07

## 2019-09-06 NOTE — Therapy (Signed)
[place], [place], [address] Phone: [phone]   Fax:  [phone]  Physical Therapy Treatment  Patient Details  Name: Jessica Harrell MRN: [id number] Date of Birth: Apr 19, 1941 Referring Provider (PT): [name], MD   Encounter Date: 09/06/2019  PT End of Session - 09/06/19 1120    Visit Number  3    Number of Visits  24    Date for PT Re-Evaluation  10/25/19   Mini-reassess 09/27/19   Authorization Type  [employer] Medicare Visits based on Medical Necessity    Authorization Time Period  08/30/19 - 10/30/19    Authorization - Visit Number  3    Authorization - Number of Visits  10    PT Start Time  1115    PT Stop Time  1200    PT Time Calculation (min)  45 min    Activity Tolerance  Patient tolerated treatment well    Behavior During Therapy  [facility] for tasks assessed/performed       Past Medical History:  Diagnosis Date  . Breast cancer ([place])   . Right foot pain Nov. 2014    Past Surgical History:  Procedure Laterality Date  . BREAST BIOPSY    . BUBBLE STUDY  05/18/2019   Procedure: BUBBLE STUDY;  Surgeon: Elouise Munroe, MD;  Location: [facility] ENDOSCOPY;  Service: Cardiology;;  . CATARACT EXTRACTION     both eyes  . EYE SURGERY Left   . MASTECTOMY W/ SENTINEL NODE BIOPSY Left 05/09/2019   Procedure: LEFT MASTECTOMY WITH LEFT AXILLARY SENTINEL LYMPH NODE BIOPSY;  Surgeon: Coralie Keens, MD;  Location: [place];  Service: General;  Laterality: Left;  . TEE WITHOUT CARDIOVERSION N/A 05/18/2019   Procedure: TRANSESOPHAGEAL ECHOCARDIOGRAM (TEE);  Surgeon: Elouise Munroe, MD;  Location: [place];  Service: Cardiology;  Laterality: N/A;    There were no vitals filed for this visit.  Subjective Assessment - 09/06/19 1119    Subjective  Pt and pt's spouse report nothing new. Spouse reports pt has completed HEP 1x since last sesion and it went well.    Patient is accompained by:  Family member    Daughter: [place]   Pertinent History  CVA 05/14/19    Limitations  House hold activities;Standing;Walking    How long can you stand comfortably?  Requires assistance and support of walker    How long can you walk comfortably?  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  No/denies             [facility] Adult PT Treatment/Exercise - 09/06/19 0001      Transfers   Transfers  Sit to Stand    Sit to Stand  3: Mod assist;4: Min assist;4: Min guard    Sit to Stand Details  Tactile cues for initiation;Verbal cues for sequencing;Verbal cues for technique;Verbal cues for safe use of DME/AE    Comments  using RW, feet positioned wider into hip width, bed sheet under bottom aiding in glute activation, encouraging hip extension; constant verbal cues to push from [facility] armrest and to shift weight forward; avoiding posterior lean once standing      Ambulation/Gait   Ambulation Distance (Feet)  20 Feet    Assistive device  Rolling walker  Gait Pattern  Ataxic   increased L step length, step to gait pattern with RLE   Gait Comments  moderate assist due to ataxic weight-shifting, physical assistance to maintain body positioned within RW frame, unable to improve step length with verbal and tactile cues, wheelchair follow for safety             PT Education - 09/06/19 1120    Education Details  Continue HEP from [facility] and stepping to sticky notes, transfer technique (pushing from seated surface, feet hip width apart, holding onto RW once standing, avoiding posterior lean in upright standing)    Person(s) Educated  Patient    Methods  Explanation;Demonstration;Tactile cues;Verbal cues    Comprehension  Verbalized understanding;Returned demonstration;Verbal cues required;Tactile cues required;Need  further instruction       PT Short Term Goals - 09/04/19 [id number]      PT SHORT TERM GOAL #1   Title  Patient will be educated on HEP and report regular compliance to improve safety and independence with functional mobility.    Time  4    Period  Weeks    Status  On-going    Target Date  09/27/19        PT Long Term Goals - 09/04/19 0959      PT LONG TERM GOAL #1   Title  Patient will demonstrate ability to perform sit to stand transfer with modified independence to improve overall functional mobility.    Time  8    Period  Weeks    Status  On-going      PT LONG TERM GOAL #2   Title  Patient will demonstrate ability to ambulate 100 feet with LRAD with no more than minimal assistance and minimal tactile and verbal cues for form.    Time  8    Period  Weeks    Status  On-going      PT LONG TERM GOAL #3   Title  Patient will demonstrate improvement on the BBS of at least 6 points indicating improved balance and independence with functional mobility.    Time  8    Period  Weeks    Status  On-going      PT LONG TERM GOAL #4   Title  Patient will demonstrate ability to correct standing posture and maintain standing balance for at least 1 minute with no more than minimal verbal cueing to improve independence with functional mobility.    Time  8    Period  Weeks    Status  On-going            Plan - 09/06/19 1214    Clinical Impression Statement  Pt presents to therapy after speech this date. Pt without carryover on transfer techniques since last session. Positioned pt in front of mirror for visual feedback for transfer training, required verbal cues for upright posture and gaze into mirror to assist with standing posture 50% of the time. Performed STS transfers with sheet under pt's bottom to encourage glute activation and pt requires physical bil foot placement into hip width to avoid narrow BOS. Repeated verbal cues for pushing from [facility] armrests and weight-shift forward to  rise from seated surface, requires cues for every rep due to poor carryover, but improves functional status from moderate assist to contact guard assist. Pt requires same verbal cues for stand pivot transfers along with cues to avoid grabbing onto therapist for support and assistance. Pt ambulates 20 ft with RW, moderate assist, demonstrating  ataxia with weight-shifting in all directions, poorly controlled and large L step length, step to step with R foot, and moderate assistance to assist pt in maintaining body positioned within the RW frame. Pt tolerates static standing for 2x5 reps this date with BUE support on RW and intermittent verbal and tactile cues to shift weight over BOS to prevent loss of balance forward or backwards, requiring moderate to min guard assist with static, support standing. Will continue to progress as able.    Personal Factors and Comorbidities  Age;Comorbidity 1    Comorbidities  Hx Breast Cancer, S/P CVA    Examination-Activity Limitations  Bathing;Hygiene/Grooming;Squat;Stairs;Bend;Lift;Locomotion Level;Stand;Transfers;Dressing    Examination-Participation Restrictions  Cleaning;Shop;Laundry;Community Activity;Meal Prep    Stability/Clinical Decision Making  Evolving/Moderate complexity    Rehab Potential  Good    PT Frequency  3x / week    PT Duration  8 weeks    PT Treatment/Interventions  ADLs/Self Care Home Management;Aquatic Therapy;Cryotherapy;Electrical Stimulation;Moist Heat;DME Instruction;Gait training;Stair training;Functional mobility training;Therapeutic activities;Therapeutic exercise;Balance training;Neuromuscular re-education;Patient/family education;Manual techniques;Passive range of motion;Dry needling;Energy conservation;Taping    PT Next Visit Plan  STS training to shift weight anterior and bil hip extension, standing balance with mirror feedback, sticky notes to aide in equal step length with gait training. Consider BW support gait training.    PT Home  Exercise Plan  9/28: seated stepping towards sticky note targets for coordination    Consulted and Agree with Plan of Care  Patient;Family member/caregiver    Family Member Consulted  spouse Advertising account planner)       Patient will benefit from skilled therapeutic intervention in order to improve the following deficits and impairments:  Abnormal gait, Impaired sensation, Improper body mechanics, Decreased coordination, Decreased mobility, Postural dysfunction, Decreased activity tolerance, Decreased endurance, Decreased strength, Decreased balance, Decreased safety awareness, Difficulty walking, Impaired vision/preception  Visit Diagnosis: Other symptoms and signs involving the nervous system  Other lack of coordination  Other abnormalities of gait and mobility     Problem List Patient Active Problem List   Diagnosis Date Noted  . Acute ischemic stroke ([place]) 05/15/2019  . Acute metabolic encephalopathy [id number]  . Encephalopathy   . History of left breast cancer 05/09/2019  . Malignant neoplasm of lower-inner quadrant of left breast in female, estrogen receptor positive ([place]) 04/26/2018    Talbot Grumbling PT, DPT 09/06/19, 12:42 PM [facility] [address] [place], [place], [address] Phone: [phone]   Fax:  [phone]  Name: Jessica Harrell MRN: [id number] Date of Birth: October 01, 1941

## 2019-09-06 NOTE — Therapy (Signed)
[place], [place], [address] Phone: [phone]   Fax:  [phone]  Speech Language Pathology Treatment  Patient Details  Name: Jessica Harrell MRN: [id number] Date of Birth: 12-17-1940 Referring Provider (SLP): Kathryne Eriksson, MD   Encounter Date: 09/06/2019  End of Session - 09/06/19 1047    Visit Number  3    Number of Visits  9    Date for SLP Re-Evaluation  10/05/19    Authorization Type  UHC Medicare    SLP Start Time  1032    SLP Stop Time   1115    SLP Time Calculation (min)  43 min    Activity Tolerance  Patient tolerated treatment well       Past Medical History:  Diagnosis Date  . Breast cancer ([place])   . Right foot pain Nov. 2014    Past Surgical History:  Procedure Laterality Date  . BREAST BIOPSY    . BUBBLE STUDY  05/18/2019   Procedure: BUBBLE STUDY;  Surgeon: Elouise Munroe, MD;  Location: [facility] ENDOSCOPY;  Service: Cardiology;;  . CATARACT EXTRACTION     both eyes  . EYE SURGERY Left   . MASTECTOMY W/ SENTINEL NODE BIOPSY Left 05/09/2019   Procedure: LEFT MASTECTOMY WITH LEFT AXILLARY SENTINEL LYMPH NODE BIOPSY;  Surgeon: Coralie Keens, MD;  Location: [place];  Service: General;  Laterality: Left;  . TEE WITHOUT CARDIOVERSION N/A 05/18/2019   Procedure: TRANSESOPHAGEAL ECHOCARDIOGRAM (TEE);  Surgeon: Elouise Munroe, MD;  Location: [place];  Service: Cardiology;  Laterality: N/A;    There were no vitals filed for this visit.  Subjective Assessment - 09/06/19 1046    Subjective  "Fine"    Patient is accompained by:  Family member    Currently in Pain?  No/denies        ADULT SLP TREATMENT - 09/06/19 0001      General Information   Behavior/Cognition  Alert;Cooperative;Pleasant mood    Patient Positioning  Upright in chair    Oral care provided  N/A    HPI  Jessica Harrell is a 78 y.o. female with history of breast cancer s/p mastectomy on 05/09/2019 and  smoker who was discharged on 05/10/2019, did well for 2 days then became weak and lethargic on 05/13/2019, developing confusion and decreased p.o. intake.  On 05/15/2019, she presented to [facility] ED with CT showing bilateral cerebellar and left occipital lobe hypoattenuation.  MRI showed multiple bilateral posterior circulation cerebellar infarcts in the occipital lobes bilaterally, left thalamus and left tectum.  MRI showed diffuse intracranial arthrosclerosis.  Transferred to [facility] for further evaluation and management.  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  2D echo showed an EF of 60 to 65% without cardiac source of embolus identified and negative bubble.  TEE positive for PFO and bidirectional shunt at rest but no evidence of vegetation or clots.  Lower extremity venous Doppler negative for DVT.  Recommended loop recorder for atrial fibrillation monitoring once complete healing of recent mastectomy wound and plans on following with cardiology outpatient.  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 Pt is now referred by her PCP, Dr. Kathryne Eriksson, for outpatient services to address residual  deficits blurred vision (increased difficulty close up), balance deficits, mild cognitive impairment and dysphagia.      Treatment Provided   Treatment provided  Cognitive-Linquistic      Pain Assessment   Pain Assessment  No/denies pain      Cognitive-Linquistic Treatment   Treatment focused on  Dysarthria;Cognition;Patient/family/caregiver education    Skilled Treatment SLP provided skilled treatment targeting speech intelligibility in phrases and conversation during role playing for ordering meals at a restaurant. Pt required mod verbal cues for  speech intelligibility strategies, primarily to decrease rate and add pauses between words. SLP noted that Pt had difficulty stating what she typically ordered on her hamburger or hotdog. Unsure if this a memory deficit, wording finding deficit, or attention deficit. Pt benefited from verbal rehearsal and cues for visualizing toppings. SLP provided audio recording of short conversation and Pt verbalized that she needs to "slow down" after listening. No stuttering observed today, however family reports this is worse when she is fatigued.        Assessment / Recommendations / Plan   Plan  Continue with current plan of care      Progression Toward Goals   Progression toward goals  Progressing toward goals         SLP Short Term Goals - 09/06/19 1048      SLP SHORT TERM GOAL #1   Title  Pt will implement memory strategies in functional therapy activities with 90% acc with min cues.    Baseline  25%    Time  4    Period  Weeks    Status  On-going    Target Date  10/05/19      SLP SHORT TERM GOAL #2   Title  Pt will complete selective and alternating attention tasks (moderately complex) with 90% acc and mi/mod cues.    Baseline  limited by visual deficits; distractable, 75% mod cues    Time  4    Period  Weeks    Status  On-going    Target Date  10/05/19      SLP SHORT TERM GOAL #3   Title  Pt will complete moderate-level thought organization and planning activities with 90% acc and min assist.    Baseline  mod assist    Time  4    Period  Weeks    Status  On-going    Target Date  10/05/19      SLP SHORT TERM GOAL #4   Title  Pt will increase speech intelligibility to 90% intelligible in conversations in 1:1 conversations with SLP with min prompts for strategies and cue for error awareness.    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 Pt benefited from role playing scenarios to practice implementation of speech intelligibility strategies. Next session, plan to address attention skills during UNO card sorting and scanning task.  Speech Therapy Frequency  2x / week    Duration  4 weeks    Treatment/Interventions  SLP instruction and feedback;Compensatory strategies;Patient/family education;Compensatory techniques    Potential to Achieve Goals  Good    Potential Considerations  Ability to learn/carryover information    SLP Home Exercise Plan  Pt will be independent with HEP as assigned to facilitate carrover in home environment    Consulted and Agree with Plan of Care  Patient       Patient will benefit from skilled therapeutic intervention in order to improve the following deficits and impairments:   Cognitive communication deficit  Dysarthria and anarthria    Problem List Patient Active Problem List   Diagnosis Date Noted  . Acute ischemic stroke ([place]) 05/15/2019  . Acute metabolic encephalopathy [id number]  . Encephalopathy   . History of left breast cancer 05/09/2019  . Malignant neoplasm of lower-inner quadrant of left breast in female, estrogen receptor positive [facility]) 04/26/2018   Thank you,  Genene Churn, [place]  [facility] 09/06/2019, 10:49 AM  [place] [facility] [address] [place], [place], [address] Phone: [phone]   Fax:  [phone]   Name: Jessica Harrell MRN: [id number] Date of Birth: 07-03-1941

## 2019-09-07 ENCOUNTER — Ambulatory Visit (HOSPITAL_COMMUNITY): Payer: Medicare Other | Attending: Family Medicine

## 2019-09-07 ENCOUNTER — Encounter (HOSPITAL_COMMUNITY): Payer: Self-pay

## 2019-09-07 DIAGNOSIS — R471 Dysarthria and anarthria: Secondary | ICD-10-CM | POA: Insufficient documentation

## 2019-09-07 DIAGNOSIS — R278 Other lack of coordination: Secondary | ICD-10-CM | POA: Insufficient documentation

## 2019-09-07 DIAGNOSIS — R29818 Other symptoms and signs involving the nervous system: Secondary | ICD-10-CM | POA: Insufficient documentation

## 2019-09-07 DIAGNOSIS — R2689 Other abnormalities of gait and mobility: Secondary | ICD-10-CM | POA: Insufficient documentation

## 2019-09-07 DIAGNOSIS — R41841 Cognitive communication deficit: Secondary | ICD-10-CM | POA: Insufficient documentation

## 2019-09-07 NOTE — Therapy (Signed)
[place], [place], [address] Phone: [phone]   Fax:  [phone]  Physical Therapy Treatment  Patient Details  Name: Jessica Harrell MRN: [id number] Date of Birth: [birth year] Referring Provider (PT): [name], MD   Encounter Date: 09/07/2019  PT End of Session - 09/07/19 1227    Visit Number  4    Number of Visits  24    Date for PT Re-Evaluation  10/25/19   Mini-reassess 09/27/19   Authorization Type  [employer] Medicare Visits based on Medical Necessity    Authorization Time Period  08/30/19 - 10/30/19    Authorization - Visit Number  4    Authorization - Number of Visits  10    PT Start Time  [id number]    PT Stop Time  1117    PT Time Calculation (min)  45 min    Equipment Utilized During Treatment  Gait belt    Activity Tolerance  Patient tolerated treatment well    Behavior During Therapy  [facility] for tasks assessed/performed       Past Medical History:  Diagnosis Date  . Breast cancer ([place])   . Right foot pain Nov. 2014    Past Surgical History:  Procedure Laterality Date  . BREAST BIOPSY    . BUBBLE STUDY  05/18/2019   Procedure: BUBBLE STUDY;  Surgeon: Elouise Munroe, MD;  Location: [facility] ENDOSCOPY;  Service: Cardiology;;  . CATARACT EXTRACTION     both eyes  . EYE SURGERY Left   . MASTECTOMY W/ SENTINEL NODE BIOPSY Left 05/09/2019   Procedure: LEFT MASTECTOMY WITH LEFT AXILLARY SENTINEL LYMPH NODE BIOPSY;  Surgeon: Coralie Keens, MD;  Location: [place];  Service: General;  Laterality: Left;  . TEE WITHOUT CARDIOVERSION N/A 05/18/2019   Procedure: TRANSESOPHAGEAL ECHOCARDIOGRAM (TEE);  Surgeon: Elouise Munroe, MD;  Location: [place];  Service: Cardiology;  Laterality: N/A;    There were no vitals filed for this visit.  Subjective Assessment - 09/07/19 1037    Subjective  Pt reports nothing new since last session.    Patient is accompained by:  Family member   Daughter: Jessica Harrell and  spouse Jessica Drafts)   Pertinent History  CVA 05/14/19    Limitations  House hold activities;Standing;Walking    How long can you stand comfortably?  Requires assistance and support of walker    How long can you walk comfortably?  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  No/denies              [facility] Adult PT Treatment/Exercise - 09/07/19 0001      Transfers   Transfers  Sit to [facility];Supine to Sit;Sit to Supine    Sit to Stand  3: Mod assist    Sit to Stand Details  Tactile cues for initiation;Verbal cues for sequencing;Verbal cues for technique;Verbal cues for safe use of DME/AE    Stand Pivot Transfers  3: Mod assist    Supine to Sit  4: Min guard   verbal cues for attention to task   Sit to Supine  4: Min guard   verbal cues for attention to task   Transfer Cueing  stand pivot: attempted with RW, but safer without; verbal cues to push  form seated surface, improving weight-shift into balls of feet and toes, avoiding posterior shift, going to R and L, increased lean to R posterior despite cues    Comments  STS: using RW, feet positioned wider into hip width, bed sheet under bottom aiding in glute activation, encouraging hip extension; constant verbal cues to push from [facility] armrest and to shift weight forward; avoiding posterior lean once standing      Ambulation/Gait   Assistive device  Rolling walker    Gait Pattern  Ataxic    Gait Comments  moderate assist, sticky notes positioned on floor to improve equal and appropriate step length; continues ataxia with poor weight-shifting requiring increased assistance      Knee/Hip Exercises: Standing   Heel Raises  10 reps    Heel Raises Limitations  RW and min assist    Hip Flexion  10 reps    Hip Flexion Limitations   RW and min assist    Functional Squat  5 reps    Functional Squat Limitations  mini squats, RW and min assist for balance    Gait Training  attempted retro gait, only able to perform 3 steps, poor weight shift requiring mod-max assist to maintain upright balance             PT Education - 09/07/19 1037    Education Details  Exericse technique, continue HEP, transfer technique    Person(s) Educated  Patient    Methods  Explanation    Comprehension  Verbalized understanding       PT Short Term Goals - 09/04/19 0958      PT SHORT TERM GOAL #1   Title  Patient will be educated on HEP and report regular compliance to improve safety and independence with functional mobility.    Time  4    Period  Weeks    Status  On-going    Target Date  09/27/19        PT Long Term Goals - 09/04/19 0959      PT LONG TERM GOAL #1   Title  Patient will demonstrate ability to perform sit to stand transfer with modified independence to improve overall functional mobility.    Time  8    Period  Weeks    Status  On-going      PT LONG TERM GOAL #2   Title  Patient will demonstrate ability to ambulate 100 feet with LRAD with no more than minimal assistance and minimal tactile and verbal cues for form.    Time  8    Period  Weeks    Status  On-going      PT LONG TERM GOAL #3   Title  Patient will demonstrate improvement on the BBS of at least 6 points indicating improved balance and independence with functional mobility.    Time  8    Period  Weeks    Status  On-going      PT LONG TERM GOAL #4   Title  Patient will demonstrate ability to correct standing posture and maintain standing balance for at least 1 minute with no more than minimal verbal cueing to improve independence with functional mobility.    Time  8    Period  Weeks    Status  On-going            Plan - 09/07/19 1228    Clinical Impression Statement  Continued with transfer training this date. Pt continues to require  verbal cues for  all reps to push from seated surface and shift weight anterior to rise from chair and cues to reach back for seated surface and sit with controlled motion. Gait training with sticky notes on floor for visual feedback of equal and appropriate bil step length, RW and mod assist. Attempted retrowalking, but pt only able to sequence 3 steps backwards before requiring seated rest break to recover. Performed supine<>sit transfers mimicking home situation, pt able to complete with contact guard assist and verbal cues for attention to task. Pt with increased difficulty performing stand pivot transfers using RW versus therapist anterior to pt, pt attempts to lift RW to reposition, so d/c and performed without. Pt continues to maintain weight through heels with posterior lean, with minimal improvements with verbal and tactile cues. Session with mirror positioned for visual feedback for pt to improve upright posture. Pt with increased R posterior lean this date when attempting to rise from sitting requiring mod assist for all STS reps. Plan to attempt bodyweight support gait next session to improve pt's self awareness and safety with gait training.    Personal Factors and Comorbidities  Age;Comorbidity 1    Comorbidities  Hx Breast Cancer, S/P CVA    Examination-Activity Limitations  Bathing;Hygiene/Grooming;Squat;Stairs;Bend;Lift;Locomotion Level;Stand;Transfers;Dressing    Examination-Participation Restrictions  Cleaning;Shop;Laundry;Community Activity;Meal Prep    Stability/Clinical Decision Making  Evolving/Moderate complexity    Rehab Potential  Good    PT Frequency  3x / week    PT Duration  8 weeks    PT Treatment/Interventions  ADLs/Self Care Home Management;Aquatic Therapy;Cryotherapy;Electrical Stimulation;Moist Heat;DME Instruction;Gait training;Stair training;Functional mobility training;Therapeutic activities;Therapeutic exercise;Balance training;Neuromuscular re-education;Patient/family  education;Manual techniques;Passive range of motion;Dry needling;Energy conservation;Taping    PT Next Visit Plan  Bodyweight support gait training. STS training to shift weight anterior and bil hip extension, standing balance with mirror feedback, sticky notes to aide in equal step length with gait training.    PT Home Exercise Plan  9/28: seated stepping towards sticky note targets for coordination    Consulted and Agree with Plan of Care  Patient;Family member/caregiver    Family Member Consulted  spouse Jessica Drafts), daughter (Jessica Harrell)       Patient will benefit from skilled therapeutic intervention in order to improve the following deficits and impairments:  Abnormal gait, Impaired sensation, Improper body mechanics, Decreased coordination, Decreased mobility, Postural dysfunction, Decreased activity tolerance, Decreased endurance, Decreased strength, Decreased balance, Decreased safety awareness, Difficulty walking, Impaired vision/preception  Visit Diagnosis: Other symptoms and signs involving the nervous system  Other lack of coordination  Other abnormalities of gait and mobility     Problem List Patient Active Problem List   Diagnosis Date Noted  . Acute ischemic stroke ([place]) 05/15/2019  . Acute metabolic encephalopathy [id number]  . Encephalopathy   . History of left breast cancer 05/09/2019  . Malignant neoplasm of lower-inner quadrant of left breast in female, estrogen receptor positive ([place]) 04/26/2018     Talbot Grumbling PT, DPT 09/07/19, 12:41 PM [place] [address] [place], [place], [address] Phone: [phone]   Fax:  [phone]  Name: Jessica Harrell MRN: [id number] Date of Birth: 05-Mar-1941

## 2019-09-11 ENCOUNTER — Encounter (HOSPITAL_COMMUNITY): Payer: Self-pay | Admitting: Speech Pathology

## 2019-09-11 ENCOUNTER — Other Ambulatory Visit: Payer: Self-pay

## 2019-09-11 ENCOUNTER — Encounter (HOSPITAL_COMMUNITY): Payer: Self-pay

## 2019-09-11 ENCOUNTER — Ambulatory Visit (HOSPITAL_COMMUNITY): Payer: Medicare Other | Admitting: Speech Pathology

## 2019-09-11 ENCOUNTER — Ambulatory Visit (HOSPITAL_COMMUNITY): Payer: Medicare Other

## 2019-09-11 DIAGNOSIS — R29818 Other symptoms and signs involving the nervous system: Secondary | ICD-10-CM | POA: Diagnosis not present

## 2019-09-11 DIAGNOSIS — R41841 Cognitive communication deficit: Secondary | ICD-10-CM

## 2019-09-11 DIAGNOSIS — R2689 Other abnormalities of gait and mobility: Secondary | ICD-10-CM

## 2019-09-11 DIAGNOSIS — R471 Dysarthria and anarthria: Secondary | ICD-10-CM

## 2019-09-11 DIAGNOSIS — R278 Other lack of coordination: Secondary | ICD-10-CM

## 2019-09-11 NOTE — Therapy (Signed)
[place], [place], [address] Phone: [phone]   Fax:  [phone]  Speech Language Pathology Treatment  Patient Details  Name: Jessica Harrell MRN: [id number] Date of Birth: 1941-06-06 Referring Provider (SLP): Kathryne Eriksson, MD   Encounter Date: 09/11/2019  End of Session - 09/11/19 1117    Visit Number  4    Number of Visits  9    Date for SLP Re-Evaluation  10/05/19    Authorization Type  [employer] Medicare    SLP Start Time  1036    SLP Stop Time   1117    SLP Time Calculation (min)  41 min    Activity Tolerance  Patient tolerated treatment well       Past Medical History:  Diagnosis Date  . Breast cancer ([place])   . Right foot pain Nov. 2014    Past Surgical History:  Procedure Laterality Date  . BREAST BIOPSY    . BUBBLE STUDY  05/18/2019   Procedure: BUBBLE STUDY;  Surgeon: Elouise Munroe, MD;  Location: [facility] ENDOSCOPY;  Service: Cardiology;;  . CATARACT EXTRACTION     both eyes  . EYE SURGERY Left   . MASTECTOMY W/ SENTINEL NODE BIOPSY Left 05/09/2019   Procedure: LEFT MASTECTOMY WITH LEFT AXILLARY SENTINEL LYMPH NODE BIOPSY;  Surgeon: Coralie Keens, MD;  Location: [place];  Service: General;  Laterality: Left;  . TEE WITHOUT CARDIOVERSION N/A 05/18/2019   Procedure: TRANSESOPHAGEAL ECHOCARDIOGRAM (TEE);  Surgeon: Elouise Munroe, MD;  Location: [place];  Service: Cardiology;  Laterality: N/A;    There were no vitals filed for this visit.  Subjective Assessment - 09/11/19 1042    Subjective  "I made a pound cake."    Patient is accompained by:  Family member    Currently in Pain?  No/denies        ADULT SLP TREATMENT - 09/11/19 1114      General Information   Behavior/Cognition  Alert;Cooperative;Pleasant mood    Patient Positioning  Upright in chair    Oral care provided  N/A    HPI  [name] is a 78 y.o. female with history of breast cancer s/p mastectomy on  05/09/2019 and smoker who was discharged on 05/10/2019, did well for 2 days then became weak and lethargic on 05/13/2019, developing confusion and decreased p.o. intake.  On 05/15/2019, she presented to [facility] ED with CT showing bilateral cerebellar and left occipital lobe hypoattenuation.  MRI showed multiple bilateral posterior circulation cerebellar infarcts in the occipital lobes bilaterally, left thalamus and left tectum.  MRI showed diffuse intracranial arthrosclerosis.  Transferred to [facility] for further evaluation and management.  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  2D echo showed an EF of 60 to 65% without cardiac source of embolus identified and negative bubble.  TEE positive for PFO and bidirectional shunt at rest but no evidence of vegetation or clots.  Lower extremity venous Doppler negative for DVT.  Recommended loop recorder for atrial fibrillation monitoring once complete healing of recent mastectomy wound and plans on following with cardiology outpatient.  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 Pt is now referred by her PCP, Dr. Kathryne Eriksson, for outpatient  services to address residual deficits blurred vision (increased difficulty close up), balance deficits, mild cognitive impairment and dysphagia.      Treatment Provided   Treatment provided  Cognitive-Linquistic      Pain Assessment   Pain Assessment  No/denies pain      Cognitive-Linquistic Treatment   Treatment focused on  Dysarthria;Cognition;Patient/family/caregiver education    Skilled Treatment  SLP provided skilled treatment targeting speech intelligibility in phrases and conversation during role playing for ordering meals at a restaurant and answering open  ended Wh questions. Pt required mod verbal cues for speech intelligibility strategies, primarily to decrease rate and add pauses between words. She accurately verbalized her address, phone number, and toppings for her burger. SLP again reminded Pt to visualize food toppings in order to help verbalize appropriate responses.           Assessment / Recommendations / Plan   Plan  Continue with current plan of care      Progression Toward Goals   Progression toward goals  Progressing toward goals         SLP Short Term Goals - 09/11/19 1118      SLP SHORT TERM GOAL #1   Title  Pt will implement memory strategies in functional therapy activities with 90% acc with min cues.    Baseline  25%    Time  4    Period  Weeks    Status  On-going    Target Date  10/05/19      SLP SHORT TERM GOAL #2   Title  Pt will complete selective and alternating attention tasks (moderately complex) with 90% acc and mi/mod cues.    Baseline  limited by visual deficits; distractable, 75% mod cues    Time  4    Period  Weeks    Status  On-going    Target Date  10/05/19      SLP SHORT TERM GOAL #3   Title  Pt will complete moderate-level thought organization and planning activities with 90% acc and min assist.    Baseline  mod assist    Time  4    Period  Weeks    Status  On-going    Target Date  10/05/19      SLP SHORT TERM GOAL #4   Title  Pt will increase speech intelligibility to 90% intelligible in conversations in 1:1 conversations with SLP with min prompts for strategies and cue for error awareness.    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 Pt benefited from role playing scenarios to practice implementation of speech intelligibility strategies. Next session, plan to address attention skills during UNO card sorting and scanning task.    Speech Therapy Frequency  2x / week    Duration  4 weeks    Treatment/Interventions  SLP instruction and feedback;Compensatory strategies;Patient/family education;Compensatory techniques  Potential to Achieve Goals  Good    Potential Considerations  Ability to learn/carryover information    SLP Home Exercise Plan  Pt will be independent with HEP as assigned to facilitate carrover in home environment    Consulted and Agree with Plan of Care  Patient       Patient will benefit from skilled therapeutic intervention in order to improve the following deficits and impairments:   Cognitive communication deficit  Dysarthria and anarthria    Problem List Patient Active Problem List   Diagnosis Date Noted  . Acute ischemic stroke ([place]) 05/15/2019  . Acute metabolic encephalopathy [id number]  . Encephalopathy   . History of left breast cancer 05/09/2019  . Malignant neoplasm of lower-inner quadrant of left breast in female, estrogen receptor positive [facility]) 04/26/2018   Thank you,  Genene Churn, [place] 09/11/2019, 11:19 AM  [place], [place], [address] Phone: [phone]   Fax:  [phone]   Name: Jessica Harrell MRN: [id number] Date of Birth: 19-Sep-1941

## 2019-09-11 NOTE — Therapy (Signed)
[place], [place], [address] Phone: [phone]   Fax:  [phone]  Physical Therapy Treatment  Patient Details  Name: Jessica Harrell MRN: [id number] Date of Birth: 11-22-1941 Referring Provider (PT): [name], MD   Encounter Date: 09/11/2019  PT End of Session - 09/11/19 1112    Visit Number  5    Number of Visits  24    Date for PT Re-Evaluation  10/25/19   Mini-reassess 09/27/19   Authorization Type  [employer] Medicare Visits based on Medical Necessity    Authorization Time Period  08/30/19 - 10/30/19    Authorization - Visit Number  5    Authorization - Number of Visits  10    PT Start Time  1120    PT Stop Time  1200    PT Time Calculation (min)  40 min    Equipment Utilized During Treatment  Gait belt;Other (comment)   body-weight support   Activity Tolerance  Patient tolerated treatment well    Behavior During Therapy  WFL for tasks assessed/performed       Past Medical History:  Diagnosis Date  . Breast cancer ([place])   . Right foot pain Nov. 2014    Past Surgical History:  Procedure Laterality Date  . BREAST BIOPSY    . BUBBLE STUDY  05/18/2019   Procedure: BUBBLE STUDY;  Surgeon: Elouise Munroe, MD;  Location: [facility] ENDOSCOPY;  Service: Cardiology;;  . CATARACT EXTRACTION     both eyes  . EYE SURGERY Left   . MASTECTOMY W/ SENTINEL NODE BIOPSY Left 05/09/2019   Procedure: LEFT MASTECTOMY WITH LEFT AXILLARY SENTINEL LYMPH NODE BIOPSY;  Surgeon: Coralie Keens, MD;  Location: [place];  Service: General;  Laterality: Left;  . TEE WITHOUT CARDIOVERSION N/A 05/18/2019   Procedure: TRANSESOPHAGEAL ECHOCARDIOGRAM (TEE);  Surgeon: Elouise Munroe, MD;  Location: [place];  Service: Cardiology;  Laterality: N/A;    There were no vitals filed for this visit.  Subjective Assessment - 09/11/19 1141    Subjective  Pt reports she helped her daughter make a cake this weekend. Pt's daughter and  spouse report she is shifting weight better, but sometimes goes too fat to right or left requiring assistance to prevent fall.    Patient is accompained by:  Family member   Daughter: [name] and spouse [name])   Pertinent History  CVA 05/14/19    Limitations  House hold activities;Standing;Walking    How long can you stand comfortably?  Requires assistance and support of walker    How long can you walk comfortably?  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  No/denies             [facility] Adult PT Treatment/Exercise - 09/11/19 0001      Transfers   Transfers  Sit to Stand    Sit to Stand  3: Mod assist;4: Min guard    Sit to Stand Details  Tactile cues for initiation;Verbal cues for sequencing;Verbal cues for technique;Verbal cues for safe use of DME/AE    Comments  STS: using RW, mod assist; therapist anterior to pt, min guard assist      Ambulation/Gait   Ambulation Distance (Feet)  10 Feet   forward and  retro, x4 reps   Assistive device  Rolling walker    Gait Pattern  Ataxic    Gait Comments  body-weight support donned, tactile and verbal cues for bil hip extension, tactile cues to avoid narrow BOS, mod assist with R foot step placement to improve step length, intermittent RW management assist             PT Education - 09/11/19 1142    Education Details  body-weght support, continue HEP, transfer technique    Person(s) Educated  Patient    Methods  Explanation    Comprehension  Verbalized understanding       PT Short Term Goals - 09/04/19 0958      PT SHORT TERM GOAL #1   Title  Patient will be educated on HEP and report regular compliance to improve safety and independence with functional mobility.    Time  4    Period  Weeks    Status  On-going    Target  Date  09/27/19        PT Long Term Goals - 09/04/19 0959      PT LONG TERM GOAL #1   Title  Patient will demonstrate ability to perform sit to stand transfer with modified independence to improve overall functional mobility.    Time  8    Period  Weeks    Status  On-going      PT LONG TERM GOAL #2   Title  Patient will demonstrate ability to ambulate 100 feet with LRAD with no more than minimal assistance and minimal tactile and verbal cues for form.    Time  8    Period  Weeks    Status  On-going      PT LONG TERM GOAL #3   Title  Patient will demonstrate improvement on the BBS of at least 6 points indicating improved balance and independence with functional mobility.    Time  8    Period  Weeks    Status  On-going      PT LONG TERM GOAL #4   Title  Patient will demonstrate ability to correct standing posture and maintain standing balance for at least 1 minute with no more than minimal verbal cueing to improve independence with functional mobility.    Time  8    Period  Weeks    Status  On-going            Plan - 09/11/19 1113    Clinical Impression Statement  Treatment session focused on body-weight support gait training. Added body-weight support for gait training this date to improve safety and allow therapist to better provide tactile cues for step progression and weight shifting with forward and retro gait. Tactile cues to pt's bil hip extensors with retro gait and constant verbal cues to encourage improving hip extension and weight-shifting with therapist's foot positioned between pt's feet to encourage appropriate wide BOS. Therapist provided tactile cues with mod assist in positioning R foot at appropriate step length to promote equal bil step length, also aided in weight shifting laterally to promote opposite LE stepping. Pt with static L trunk lean and posterior lean this date, improved with RW positioned further anterior encouraging weight shift into balls of bil  feet. Pt continues to perform STS reps from [facility] with verbal cues for pushing from seated surface and "nose over toes" 100% of the time; mod assist with RW and min guard assist without AD and with therapist positioned anterior to  pt. Continue to progress as able.    Personal Factors and Comorbidities  Age;Comorbidity 1    Comorbidities  Hx Breast Cancer, S/P CVA    Examination-Activity Limitations  Bathing;Hygiene/Grooming;Squat;Stairs;Bend;Lift;Locomotion Level;Stand;Transfers;Dressing    Examination-Participation Restrictions  Cleaning;Shop;Laundry;Community Activity;Meal Prep    Stability/Clinical Decision Making  Evolving/Moderate complexity    Rehab Potential  Good    PT Frequency  3x / week    PT Duration  8 weeks    PT Treatment/Interventions  ADLs/Self Care Home Management;Aquatic Therapy;Cryotherapy;Electrical Stimulation;Moist Heat;DME Instruction;Gait training;Stair training;Functional mobility training;Therapeutic activities;Therapeutic exercise;Balance training;Neuromuscular re-education;Patient/family education;Manual techniques;Passive range of motion;Dry needling;Energy conservation;Taping    PT Next Visit Plan  Continue body-weight support encouraging weight shifting and appropriate R step length. STS training to shift weight anterior and bil hip extension, standing balance with mirror feedback, sticky notes to aide in equal step length with gait training.    PT Home Exercise Plan  9/28: seated stepping towards sticky note targets for coordination    Consulted and Agree with Plan of Care  Patient;Family member/caregiver    Family Member Consulted  spouse [name]), daughter ([place])       Patient will benefit from skilled therapeutic intervention in order to improve the following deficits and impairments:  Abnormal gait, Impaired sensation, Improper body mechanics, Decreased coordination, Decreased mobility, Postural dysfunction, Decreased activity tolerance, Decreased endurance,  Decreased strength, Decreased balance, Decreased safety awareness, Difficulty walking, Impaired vision/preception  Visit Diagnosis: Other symptoms and signs involving the nervous system  Other lack of coordination  Other abnormalities of gait and mobility     Problem List Patient Active Problem List   Diagnosis Date Noted  . Acute ischemic stroke ([place]) 05/15/2019  . Acute metabolic encephalopathy [id number]  . Encephalopathy   . History of left breast cancer 05/09/2019  . Malignant neoplasm of lower-inner quadrant of left breast in female, estrogen receptor positive ([place]) 04/26/2018      Talbot Grumbling PT, DPT 09/11/19, 12:23 PM [place], [place], [address] Phone: [phone]   Fax:  [phone]  Name: Jessica Harrell MRN: [id number] Date of Birth: 04-09-1941

## 2019-09-13 ENCOUNTER — Ambulatory Visit (HOSPITAL_COMMUNITY): Payer: Medicare Other | Admitting: Speech Pathology

## 2019-09-13 ENCOUNTER — Other Ambulatory Visit: Payer: Self-pay

## 2019-09-13 ENCOUNTER — Ambulatory Visit (HOSPITAL_COMMUNITY): Payer: Medicare Other | Admitting: Physical Therapy

## 2019-09-13 ENCOUNTER — Encounter (HOSPITAL_COMMUNITY): Payer: Self-pay | Admitting: Physical Therapy

## 2019-09-13 ENCOUNTER — Encounter (HOSPITAL_COMMUNITY): Payer: Self-pay | Admitting: Speech Pathology

## 2019-09-13 DIAGNOSIS — R29818 Other symptoms and signs involving the nervous system: Secondary | ICD-10-CM

## 2019-09-13 DIAGNOSIS — R41841 Cognitive communication deficit: Secondary | ICD-10-CM

## 2019-09-13 DIAGNOSIS — R471 Dysarthria and anarthria: Secondary | ICD-10-CM

## 2019-09-13 DIAGNOSIS — R2689 Other abnormalities of gait and mobility: Secondary | ICD-10-CM

## 2019-09-13 DIAGNOSIS — R278 Other lack of coordination: Secondary | ICD-10-CM

## 2019-09-13 NOTE — Therapy (Signed)
[place], [place], [address] Phone: [phone]   Fax:  [phone]  Speech Language Pathology Treatment  Patient Details  Name: Jessica Harrell MRN: [id number] Date of Birth: [birth year] Referring Provider (SLP): Kathryne Eriksson, MD   Encounter Date: 09/13/2019  End of Session - 09/13/19 1134    Visit Number  5    Number of Visits  9    Date for SLP Re-Evaluation  10/05/19    Authorization Type  [employer] Medicare    SLP Start Time  1032    SLP Stop Time   1115    SLP Time Calculation (min)  43 min    Activity Tolerance  Patient tolerated treatment well       Past Medical History:  Diagnosis Date  . Breast cancer ([place])   . Right foot pain Nov. 2014    Past Surgical History:  Procedure Laterality Date  . BREAST BIOPSY    . BUBBLE STUDY  05/18/2019   Procedure: BUBBLE STUDY;  Surgeon: Elouise Munroe, MD;  Location: [facility] ENDOSCOPY;  Service: Cardiology;;  . CATARACT EXTRACTION     both eyes  . EYE SURGERY Left   . MASTECTOMY W/ SENTINEL NODE BIOPSY Left 05/09/2019   Procedure: LEFT MASTECTOMY WITH LEFT AXILLARY SENTINEL LYMPH NODE BIOPSY;  Surgeon: Coralie Keens, MD;  Location: [place];  Service: General;  Laterality: Left;  . TEE WITHOUT CARDIOVERSION N/A 05/18/2019   Procedure: TRANSESOPHAGEAL ECHOCARDIOGRAM (TEE);  Surgeon: Elouise Munroe, MD;  Location: [place];  Service: Cardiology;  Laterality: N/A;    There were no vitals filed for this visit.  Subjective Assessment - 09/13/19 1056    Subjective  "Not much."    Patient is accompained by:  Family member    Currently in Pain?  No/denies            ADULT SLP TREATMENT - 09/13/19 1133      General Information   Behavior/Cognition  Alert;Cooperative;Pleasant mood    Patient Positioning  Upright in chair    Oral care provided  N/A    HPI  [name] is a 78 y.o. female with history of breast cancer s/p mastectomy on  05/09/2019 and smoker who was discharged on 05/10/2019, did well for 2 days then became weak and lethargic on 05/13/2019, developing confusion and decreased p.o. intake.  On 05/15/2019, she presented to [facility] ED with CT showing bilateral cerebellar and left occipital lobe hypoattenuation.  MRI showed multiple bilateral posterior circulation cerebellar infarcts in the occipital lobes bilaterally, left thalamus and left tectum.  MRI showed diffuse intracranial arthrosclerosis.  Transferred to [facility] for further evaluation and management.  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  2D echo showed an EF of 60 to 65% without cardiac source of embolus identified and negative bubble.  TEE positive for PFO and bidirectional shunt at rest but no evidence of vegetation or clots.  Lower extremity venous Doppler negative for DVT.  Recommended loop recorder for atrial fibrillation monitoring once complete healing of recent mastectomy wound and plans on following with cardiology outpatient.  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 Pt is now referred by her PCP, Dr. Kathryne Eriksson, for  outpatient services to address residual deficits blurred vision (increased difficulty close up), balance deficits, mild cognitive impairment and dysphagia.      Treatment Provided   Treatment provided  Cognitive-Linquistic      Pain Assessment   Pain Assessment  No/denies pain      Cognitive-Linquistic Treatment   Treatment focused on  Dysarthria;Cognition;Patient/family/caregiver education    Skilled Treatment  SLP provided skilled treatment targeting speech intelligibility, word finding, memory, and attention to task during alternating attention task. Pt required to  alternate verbalizing female and female names while going through the alphabet (A-Z). She completed naming task with 96% acc when provided min cues; She required mi/mod cues to attend to appropriate request (alternating female/female). Speech intelligibility was judged to be ~90% intelligible at the word level. Pt has difficulty producing nasal sounds (they tend to sound like plosives).     Assessment / Recommendations / Plan   Plan  Continue with current plan of care      Progression Toward Goals   Progression toward goals  Progressing toward goals       SLP Education - 09/13/19 1133    Education Details  Provided memory strategies and ideas to work on attention, memory, and word finding at home; rest breaks    Person(s) Educated  Patient;Spouse;Child(ren)    Methods  Explanation;Handout    Comprehension  Verbalized understanding       SLP Short Term Goals - 09/13/19 1135      SLP SHORT TERM GOAL #1   Title  Pt will implement memory strategies in functional therapy activities with 90% acc with min cues.    Baseline  25%    Time  4    Period  Weeks    Status  On-going    Target Date  10/05/19      SLP SHORT TERM GOAL #2   Title  Pt will complete selective and alternating attention tasks (moderately complex) with 90% acc and mi/mod cues.    Baseline  limited by visual deficits; distractable, 75% mod cues    Time  4    Period  Weeks    Status  On-going    Target Date  10/05/19      SLP SHORT TERM GOAL #3   Title  Pt will complete moderate-level thought organization and planning activities with 90% acc and min assist.    Baseline  mod assist    Time  4    Period  Weeks    Status  On-going    Target Date  10/05/19      SLP SHORT TERM GOAL #4   Title  Pt will increase speech intelligibility to 90% intelligible in conversations in 1:1 conversations with SLP with min prompts for strategies and cue for error awareness.    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 Pt appeared  to enjoy (actively engaged) selected treatment tasks this date. Her family reports that she previously enjoyed 'logic puzzles". SLP will attempt to find other tasks she can complete at home which aren't visually taxing. Her family remains supportive and ask appropriate questions in order to support Mrs. Jessica Harrell. Next session, will add some memory and attention tasks.    Speech Therapy Frequency  2x / week    Duration  4 weeks    Treatment/Interventions  SLP instruction and feedback;Compensatory strategies;Patient/family education;Compensatory techniques    Potential to Achieve Goals  Good    Potential Considerations  Ability to learn/carryover information    SLP Home Exercise Plan  Pt will be independent with HEP as assigned to facilitate carrover in home environment    Consulted and Agree with Plan of Care  Patient       Patient will benefit from skilled therapeutic intervention in order to improve the following deficits and impairments:   Cognitive communication deficit  Dysarthria and anarthria    Problem List Patient Active Problem List   Diagnosis Date Noted  . Acute ischemic stroke ([place]) 05/15/2019  . Acute metabolic encephalopathy [id number]  . Encephalopathy   . History of left breast cancer 05/09/2019  . Malignant neoplasm of lower-inner quadrant of left breast in female, estrogen receptor positive [facility]) 04/26/2018   Thank you,  Genene Churn,  [place]  [facility] 09/13/2019, 11:36 AM  [place] [address] [place], [place], [address] Phone: [phone]   Fax:  [phone]   Name: Jessica Harrell MRN: [id number] Date of Birth: 30-Jul-1941

## 2019-09-13 NOTE — Therapy (Signed)
[place], [place], [address] Phone: [phone]   Fax:  [phone]  Physical Therapy Treatment  Patient Details  Name: Jessica Harrell MRN: [id number] Date of Birth: 07-20-1941 Referring Provider (PT): [name], MD   Encounter Date: 09/13/2019  PT End of Session - 09/13/19 1117    Visit Number  6    Number of Visits  24    Date for PT Re-Evaluation  10/25/19   Mini-reassess 09/27/19   Authorization Type  [employer] Medicare Visits based on Medical Necessity    Authorization Time Period  08/30/19 - 10/30/19    Authorization - Visit Number  6    Authorization - Number of Visits  10    PT Start Time  [id number]    PT Stop Time  1200    PT Time Calculation (min)  44 min    Equipment Utilized During Treatment  Gait belt;Other (comment)   body-weight support   Activity Tolerance  Patient tolerated treatment well    Behavior During Therapy  WFL for tasks assessed/performed       Past Medical History:  Diagnosis Date  . Breast cancer ([place])   . Right foot pain Nov. 2014    Past Surgical History:  Procedure Laterality Date  . BREAST BIOPSY    . BUBBLE STUDY  05/18/2019   Procedure: BUBBLE STUDY;  Surgeon: Elouise Munroe, MD;  Location: [facility] ENDOSCOPY;  Service: Cardiology;;  . CATARACT EXTRACTION     both eyes  . EYE SURGERY Left   . MASTECTOMY W/ SENTINEL NODE BIOPSY Left 05/09/2019   Procedure: LEFT MASTECTOMY WITH LEFT AXILLARY SENTINEL LYMPH NODE BIOPSY;  Surgeon: Coralie Keens, MD;  Location: [place];  Service: General;  Laterality: Left;  . TEE WITHOUT CARDIOVERSION N/A 05/18/2019   Procedure: TRANSESOPHAGEAL ECHOCARDIOGRAM (TEE);  Surgeon: Elouise Munroe, MD;  Location: [place];  Service: Cardiology;  Laterality: N/A;    There were no vitals filed for this visit.  Subjective Assessment - 09/13/19 1118    Subjective  Patient reported fatigue following speech therapy.    Patient is accompained  by:  Family member   Daughter: [name] and spouse [name])   Pertinent History  CVA 05/14/19    Limitations  House hold activities;Standing;Walking    How long can you stand comfortably?  Requires assistance and support of walker    How long can you walk comfortably?  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  No/denies                       [facility] Adult PT Treatment/Exercise - 09/13/19 0001      Transfers   Transfers  Sit to Stand    Sit to Stand  3: Mod assist;4: Min guard    Sit to Stand Details  Tactile cues for initiation;Verbal cues for sequencing;Verbal cues for technique;Verbal cues for safe use of DME/AE    Comments  STS x 5: using RW, mod assist; therapist anterior to pt, min guard assist      Ambulation/Gait   Ambulation Distance (Feet)  8 Feet   Forward x 3   Assistive device  Rolling walker    Gait Pattern  Ataxic  Gait Comments  Body weight support system donned, post-it notes for step spacing with cueing      Wheelchair Mobility   Comments  Patient self propeling in [facility]      Knee/Hip Exercises: Standing   Other Standing Knee Exercises  Stepping toward cone alternating sides with therapist direction x 5 minutes    Other Standing Knee Exercises  Standing balance with mirror with vertical tape line cueing to correct posture requiring facilitation to improve balance      Knee/Hip Exercises: Seated   Other Seated Knee/Hip Exercises  Tapping cone in sitting alternating sides with therapist direction x 5 minutes               PT Short Term Goals - 09/04/19 [id number]      PT SHORT TERM GOAL #1   Title  Patient will be educated on HEP and report regular compliance to improve safety and independence with functional mobility.    Time  4    Period   Weeks    Status  On-going    Target Date  09/27/19        PT Long Term Goals - 09/04/19 0959      PT LONG TERM GOAL #1   Title  Patient will demonstrate ability to perform sit to stand transfer with modified independence to improve overall functional mobility.    Time  8    Period  Weeks    Status  On-going      PT LONG TERM GOAL #2   Title  Patient will demonstrate ability to ambulate 100 feet with LRAD with no more than minimal assistance and minimal tactile and verbal cues for form.    Time  8    Period  Weeks    Status  On-going      PT LONG TERM GOAL #3   Title  Patient will demonstrate improvement on the BBS of at least 6 points indicating improved balance and independence with functional mobility.    Time  8    Period  Weeks    Status  On-going      PT LONG TERM GOAL #4   Title  Patient will demonstrate ability to correct standing posture and maintain standing balance for at least 1 minute with no more than minimal verbal cueing to improve independence with functional mobility.    Time  8    Period  Weeks    Status  On-going            Plan - 09/13/19 1547    Clinical Impression Statement  Patient demonstrating increased fatigue this session, likely due to following speech therapy this session. Tested patient's blood pressure and found to be 123/72. Performed body weight support activities this session with standing tapping cones with feet to work on proprioception and motor control. Sit to stands with standing balance practice to correct posterior lean using a mirror and a vertical tape line to improve posture. Patient continued to demonstrate fatigue throughout session. Plan to progress patient as able.    Personal Factors and Comorbidities  Age;Comorbidity 1    Comorbidities  Hx Breast Cancer, S/P CVA    Examination-Activity Limitations  Bathing;Hygiene/Grooming;Squat;Stairs;Bend;Lift;Locomotion Level;Stand;Transfers;Dressing    Examination-Participation  Restrictions  Cleaning;Shop;Laundry;Community Activity;Meal Prep    Stability/Clinical Decision Making  Evolving/Moderate complexity    Rehab Potential  Good    PT Frequency  3x / week    PT Duration  8 weeks    PT Treatment/Interventions  ADLs/Self  Care Home Management;Aquatic Therapy;Cryotherapy;Electrical Stimulation;Moist Heat;DME Instruction;Gait training;Stair training;Functional mobility training;Therapeutic activities;Therapeutic exercise;Balance training;Neuromuscular re-education;Patient/family education;Manual techniques;Passive range of motion;Dry needling;Energy conservation;Taping    PT Next Visit Plan  Continue body-weight support encouraging weight shifting and appropriate R step length. STS training to shift weight anterior and bil hip extension, standing balance with mirror feedback, sticky notes to aide in equal step length with gait training.    PT Home Exercise Plan  9/28: seated stepping towards sticky note targets for coordination    Consulted and Agree with Plan of Care  Patient;Family member/caregiver    Family Member Consulted  spouse [name]), daughter ([place])       Patient will benefit from skilled therapeutic intervention in order to improve the following deficits and impairments:  Abnormal gait, Impaired sensation, Improper body mechanics, Decreased coordination, Decreased mobility, Postural dysfunction, Decreased activity tolerance, Decreased endurance, Decreased strength, Decreased balance, Decreased safety awareness, Difficulty walking, Impaired vision/preception  Visit Diagnosis: Other symptoms and signs involving the nervous system  Other lack of coordination  Other abnormalities of gait and mobility     Problem List Patient Active Problem List   Diagnosis Date Noted  . Acute ischemic stroke ([place]) 05/15/2019  . Acute metabolic encephalopathy [id number]  . Encephalopathy   . History of left breast cancer 05/09/2019  . Malignant neoplasm of  lower-inner quadrant of left breast in female, estrogen receptor positive ([place]) 04/26/2018   Clarene Critchley PT, DPT 3:50 PM, 09/13/19 [place], [place], [address] Phone: [phone]   Fax:  [phone]  Name: Jessica Harrell MRN: [id number] Date of Birth: [birth year]

## 2019-09-15 ENCOUNTER — Other Ambulatory Visit: Payer: Self-pay

## 2019-09-15 ENCOUNTER — Ambulatory Visit (HOSPITAL_COMMUNITY): Payer: Medicare Other | Admitting: Physical Therapy

## 2019-09-15 ENCOUNTER — Encounter (HOSPITAL_COMMUNITY): Payer: Self-pay

## 2019-09-15 ENCOUNTER — Encounter (HOSPITAL_COMMUNITY): Payer: Self-pay | Admitting: Physical Therapy

## 2019-09-15 ENCOUNTER — Ambulatory Visit (HOSPITAL_COMMUNITY): Payer: Medicare Other

## 2019-09-15 DIAGNOSIS — R278 Other lack of coordination: Secondary | ICD-10-CM

## 2019-09-15 DIAGNOSIS — R2689 Other abnormalities of gait and mobility: Secondary | ICD-10-CM

## 2019-09-15 DIAGNOSIS — R29818 Other symptoms and signs involving the nervous system: Secondary | ICD-10-CM | POA: Diagnosis not present

## 2019-09-15 NOTE — Therapy (Signed)
[place], [place], [address] Phone: [phone]   Fax:  [phone]  Physical Therapy Treatment  Patient Details  Name: Jessica Harrell MRN: [id number] Date of Birth: 01-01-1941 Referring Provider (PT): [name], MD   Encounter Date: 09/15/2019  PT End of Session - 09/15/19 1328    Visit Number  7    Number of Visits  24    Date for PT Re-Evaluation  10/25/19   Mini-reassess 09/27/19   Authorization Type  [employer] Medicare Visits based on Medical Necessity    Authorization Time Period  08/30/19 - 10/30/19    Authorization - Visit Number  7    Authorization - Number of Visits  10    PT Start Time  [id number]    PT Stop Time  1200    PT Time Calculation (min)  43 min    Equipment Utilized During Treatment  Other (comment)   body-weight support   Activity Tolerance  Patient tolerated treatment well    Behavior During Therapy  [facility] for tasks assessed/performed       Past Medical History:  Diagnosis Date  . Breast cancer ([place])   . Right foot pain Nov. 2014    Past Surgical History:  Procedure Laterality Date  . BREAST BIOPSY    . BUBBLE STUDY  05/18/2019   Procedure: BUBBLE STUDY;  Surgeon: Elouise Munroe, MD;  Location: [facility] ENDOSCOPY;  Service: Cardiology;;  . CATARACT EXTRACTION     both eyes  . EYE SURGERY Left   . MASTECTOMY W/ SENTINEL NODE BIOPSY Left 05/09/2019   Procedure: LEFT MASTECTOMY WITH LEFT AXILLARY SENTINEL LYMPH NODE BIOPSY;  Surgeon: Coralie Keens, MD;  Location: [place];  Service: General;  Laterality: Left;  . TEE WITHOUT CARDIOVERSION N/A 05/18/2019   Procedure: TRANSESOPHAGEAL ECHOCARDIOGRAM (TEE);  Surgeon: Elouise Munroe, MD;  Location: [place];  Service: Cardiology;  Laterality: N/A;    There were no vitals filed for this visit.  Subjective Assessment - 09/15/19 1257    Subjective  Patient reported feeling good. Denied any pain.    Patient is accompained by:  Family  member   Daughter: [name] and spouse [name])   Pertinent History  CVA 05/14/19    Limitations  House hold activities;Standing;Walking    How long can you stand comfortably?  Requires assistance and support of walker    How long can you walk comfortably?  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  No/denies                       [facility] Adult PT Treatment/Exercise - 09/15/19 1329      Transfers   Sit to Stand  3: Mod assist;With armrests;With upper extremity assist;Multiple attempts    Sit to Stand Details  Tactile cues for initiation;Verbal cues for sequencing;Verbal cues for technique;Verbal cues for safe use of DME/AE    Transfer Cueing  Visual cue with colored line on mirror vertically, to encourage patient to correct upright posture instead of leaning posteriorly. Verbal cues for nose over toes, bring head towards tennis ball, contact head with tennis ball for tactile cue x 5 throughout.     Comments  BWSS. STS x 5: using RW,  mod assist; therapist anterior to pt, min guard assist      Ambulation/Gait   Ambulation Distance (Feet)  --   Forward x 4 steps 5-10 steps   Assistive device  Rolling walker    Ambulation Surface  Level;Indoor    Gait Comments  Body weight support system donned, RW      Knee/Hip Exercises: Standing   Other Standing Knee Exercises  BOS awareness training in standing feet hip width distance x 2-3 minutes    Other Standing Knee Exercises  Standing marching 2 x 5 repetitions with 3 pound ankle weights to improve proprioception             PT Education - 09/15/19 1300    Education Details  Education on tactile cueing.    Person(s) Educated  Patient    Methods  Explanation    Comprehension  Verbalized understanding       PT Short  Term Goals - 09/04/19 0958      PT SHORT TERM GOAL #1   Title  Patient will be educated on HEP and report regular compliance to improve safety and independence with functional mobility.    Time  4    Period  Weeks    Status  On-going    Target Date  09/27/19        PT Long Term Goals - 09/04/19 0959      PT LONG TERM GOAL #1   Title  Patient will demonstrate ability to perform sit to stand transfer with modified independence to improve overall functional mobility.    Time  8    Period  Weeks    Status  On-going      PT LONG TERM GOAL #2   Title  Patient will demonstrate ability to ambulate 100 feet with LRAD with no more than minimal assistance and minimal tactile and verbal cues for form.    Time  8    Period  Weeks    Status  On-going      PT LONG TERM GOAL #3   Title  Patient will demonstrate improvement on the BBS of at least 6 points indicating improved balance and independence with functional mobility.    Time  8    Period  Weeks    Status  On-going      PT LONG TERM GOAL #4   Title  Patient will demonstrate ability to correct standing posture and maintain standing balance for at least 1 minute with no more than minimal verbal cueing to improve independence with functional mobility.    Time  8    Period  Weeks    Status  On-going            Plan - 09/15/19 1452    Clinical Impression Statement  This session patient with improved alertness this session. Trialed colored vertical line to visually cue patient with upright posture, however, patient did not seem to respond to this. Trialed instead cueing patient by using a tennis ball and cueing patient to lean forward towards the tennis ball and try to tap her forehead on the tennis ball. Patient did respond to this and demonstrated improved ability and awareness to shift weight anteriorly intermittently, however patient demonstrated increased cervical flexion while performing this. Also trialed limiting all posterior  cueing from therapist and instead providing tactile cues anteriorly which patient did appear to perform improved correction to upright posture with. This session also added 3# ankle weights to improve proprioceptive awareness in  standing and added standing marching. Plan to continue to explore cueing options to improve patient's postural awareness and independence with self-correction. In addition, did provide patient's husband and daughter with a list of caregivers in the area if they are interested.    Personal Factors and Comorbidities  Age;Comorbidity 1    Comorbidities  Hx Breast Cancer, S/P CVA    Examination-Activity Limitations  Bathing;Hygiene/Grooming;Squat;Stairs;Bend;Lift;Locomotion Level;Stand;Transfers;Dressing    Examination-Participation Restrictions  Cleaning;Shop;Laundry;Community Activity;Meal Prep    Stability/Clinical Decision Making  Evolving/Moderate complexity    Rehab Potential  Good    PT Frequency  3x / week    PT Duration  8 weeks    PT Treatment/Interventions  ADLs/Self Care Home Management;Aquatic Therapy;Cryotherapy;Electrical Stimulation;Moist Heat;DME Instruction;Gait training;Stair training;Functional mobility training;Therapeutic activities;Therapeutic exercise;Balance training;Neuromuscular re-education;Patient/family education;Manual techniques;Passive range of motion;Dry needling;Energy conservation;Taping    PT Next Visit Plan  Continue body-weight support encouraging weight shifting and appropriate R step length. STS training to shift weight anterior and bil hip extension, standing balance with mirror feedback, sticky notes to aide in equal step length with gait training.    PT Home Exercise Plan  9/28: seated stepping towards sticky note targets for coordination    Consulted and Agree with Plan of Care  Patient;Family member/caregiver    Family Member Consulted  spouse [name]), daughter ([place])       Patient will benefit from skilled therapeutic  intervention in order to improve the following deficits and impairments:  Abnormal gait, Impaired sensation, Improper body mechanics, Decreased coordination, Decreased mobility, Postural dysfunction, Decreased activity tolerance, Decreased endurance, Decreased strength, Decreased balance, Decreased safety awareness, Difficulty walking, Impaired vision/preception  Visit Diagnosis: Other symptoms and signs involving the nervous system  Other lack of coordination  Other abnormalities of gait and mobility     Problem List Patient Active Problem List   Diagnosis Date Noted  . Acute ischemic stroke ([place]) 05/15/2019  . Acute metabolic encephalopathy [id number]  . Encephalopathy   . History of left breast cancer 05/09/2019  . Malignant neoplasm of lower-inner quadrant of left breast in female, estrogen receptor positive ([place]) 04/26/2018   Clarene Critchley PT, DPT 2:55 PM, 09/15/19 [place], [place], [address] Phone: [phone]   Fax:  [phone]  Name: Jessica Harrell MRN: [id number] Date of Birth: Mar 18, 1941

## 2019-09-15 NOTE — Patient Instructions (Signed)
Home Exercises Program Theraputty Exercises  Do the following exercises 2-3 times a day using both hands.  Spend no more than 10-15 minutes on this.  1. Roll putty into a ball.  2. Make into a pancake.  3. Roll putty into a roll.  4. Pinch along log with first finger and thumb.   5. Make into a ball.  6. Roll it back into a log.   7. Pinch using thumb and side of first finger.  8. Roll into a ball, then flatten into a pancake.  9. Using your fingers, make putty into a mountain.  10. Roll putty into ball and squeeze and release 10 times in each hand.

## 2019-09-15 NOTE — Therapy (Signed)
[facility] [address] [place], [place], [address] Phone: [phone]   Fax:  [phone]  Occupational Therapy Treatment  Patient Details  Name: Jessica Harrell MRN: [id number] Date of Birth: 1941/10/28 Referring Provider (OT): Dr. Kathryne Eriksson   Encounter Date: 09/15/2019  OT End of Session - 09/15/19 1239    Visit Number  4    Number of Visits  8    Date for OT Re-Evaluation  09/29/19    Authorization Type  [employer] Medicare    Authorization Time Period  no visit limit    OT Start Time  1038   Pt arrived late   OT Stop Time  1113    OT Time Calculation (min)  35 min    Activity Tolerance  Patient tolerated treatment well    Behavior During Therapy  [facility] for tasks assessed/performed       Past Medical History:  Diagnosis Date  . Breast cancer ([place])   . Right foot pain Nov. 2014    Past Surgical History:  Procedure Laterality Date  . BREAST BIOPSY    . BUBBLE STUDY  05/18/2019   Procedure: BUBBLE STUDY;  Surgeon: Elouise Munroe, MD;  Location: [facility] ENDOSCOPY;  Service: Cardiology;;  . CATARACT EXTRACTION     both eyes  . EYE SURGERY Left   . MASTECTOMY W/ SENTINEL NODE BIOPSY Left 05/09/2019   Procedure: LEFT MASTECTOMY WITH LEFT AXILLARY SENTINEL LYMPH NODE BIOPSY;  Surgeon: Coralie Keens, MD;  Location: [place];  Service: General;  Laterality: Left;  . TEE WITHOUT CARDIOVERSION N/A 05/18/2019   Procedure: TRANSESOPHAGEAL ECHOCARDIOGRAM (TEE);  Surgeon: Elouise Munroe, MD;  Location: [place];  Service: Cardiology;  Laterality: N/A;    There were no vitals filed for this visit.  Subjective Assessment - 09/15/19 1236    Subjective   S: I like to bake pie.    Currently in Pain?  No/denies         [facility] OT Assessment - 09/15/19 1237      Assessment   Medical Diagnosis  s/p CVA      Precautions   Precautions  Fall;Other (comment)    Precaution Comments  Visual deficit- cortical blindness. Use contrast for  objects during session.                OT Treatments/Exercises (OP) - 09/15/19 1237      Transfers   Sit to Stand  3: Mod assist;With armrests;With upper extremity assist;Multiple attempts      Exercises   Exercises  Theraputty;Hand      Hand Exercises   Other Hand Exercises  orange thera-egg: 10X each hand 4 point pinch      Theraputty   Theraputty - Flatten  yellow- BUE standing    Theraputty - Roll  yellow- BUE    Theraputty - Grip  yellow- BUE     Theraputty - Pinch  yellow- BUE 3 point pinch and lateral    Theraputty Hand- Locate Pegs  6/6 beads located- yellow             OT Education - 09/15/19 1236    Education Details  yellow theraputty HEP for grip and pinch strengthening    Person(s) Educated  Patient;Spouse;Child(ren)    Methods  Explanation;Demonstration;Verbal cues;Handout    Comprehension  Returned demonstration;Verbalized understanding;Verbal cues required;Tactile cues required       OT Short Term Goals - 09/01/19 1600      OT SHORT  TERM GOAL #1   Title  Pt will be provided with and educated on HEP to improve safety and independence in ADL completion.    Time  4    Period  Weeks    Status  On-going    Target Date  09/30/19      OT SHORT TERM GOAL #2   Title  Pt will be educated on appropriate AE to improve independence in eating, handwriting, and self-care tasks to compensate for visual deficits and ataxia.    Time  4    Period  Weeks    Status  On-going      OT SHORT TERM GOAL #3   Title  Pt will increase bilateral grip strength by 10# and pinch strength by 2# to improve ability to grasp and maintain hold on objects during functional tasks.    Time  4    Period  Weeks    Status  On-going      OT SHORT TERM GOAL #4   Title  Pt will improve handwriting legibility by 25%, using compensatory strategies as needed, to improve independence in signing checks.    Time  4    Period  Weeks    Status  On-going      OT SHORT TERM GOAL #5    Title  Pt will demonstrate improvement in fine motor coordination by achieving at least 20 blocks with bilateral hands during box and blocks test.    Time  4    Period  Weeks    Status  On-going               Plan - 09/15/19 1244    Clinical Impression Statement  A: daughter brought in a workbook she purchased at [employer]: [place]. Showed therapist the mazes inside and lince tracing patient has been completing at home. Workbook has great contrast. Patient appears to have a 1 inch inferior difference in visual perception when completing certain mazes    Body Structure / Function / Physical Skills  ADL;Endurance;UE functional use;Vision;Body mechanics;Proprioception;Coordination;IADL;Strength    Plan  P: Continue working on vision and compensatory strategies for task completion    Consulted and Agree with Plan of Care  Patient;Family member/caregiver    Family Member Consulted  daughter and spouse       Patient will benefit from skilled therapeutic intervention in order to improve the following deficits and impairments:   Body Structure / Function / Physical Skills: ADL, Endurance, UE functional use, Vision, Body mechanics, Proprioception, Coordination, IADL, Strength       Visit Diagnosis: Other lack of coordination  Other symptoms and signs involving the nervous system    Problem List Patient Active Problem List   Diagnosis Date Noted  . Acute ischemic stroke ([place]) 05/15/2019  . Acute metabolic encephalopathy [id number]  . Encephalopathy   . History of left breast cancer 05/09/2019  . Malignant neoplasm of lower-inner quadrant of left breast in female, estrogen receptor positive [facility]) 04/26/2018   Jessica Harrell, OTR/L,CBIS  [phone]  09/15/2019, 12:47 PM  [place], [place], [address] Phone: [phone]   Fax:  [phone]  Name: Jessica Harrell MRN: [id number] Date of  Birth: Nov 27, 1941

## 2019-09-18 ENCOUNTER — Other Ambulatory Visit: Payer: Self-pay

## 2019-09-18 ENCOUNTER — Ambulatory Visit (HOSPITAL_COMMUNITY): Payer: Medicare Other

## 2019-09-18 ENCOUNTER — Encounter (HOSPITAL_COMMUNITY): Payer: Self-pay | Admitting: Speech Pathology

## 2019-09-18 ENCOUNTER — Ambulatory Visit (HOSPITAL_COMMUNITY): Payer: Medicare Other | Admitting: Speech Pathology

## 2019-09-18 ENCOUNTER — Encounter (HOSPITAL_COMMUNITY): Payer: Self-pay

## 2019-09-18 ENCOUNTER — Ambulatory Visit (INDEPENDENT_AMBULATORY_CARE_PROVIDER_SITE_OTHER): Payer: Medicare Other | Admitting: *Deleted

## 2019-09-18 DIAGNOSIS — R29818 Other symptoms and signs involving the nervous system: Secondary | ICD-10-CM

## 2019-09-18 DIAGNOSIS — R278 Other lack of coordination: Secondary | ICD-10-CM

## 2019-09-18 DIAGNOSIS — R41841 Cognitive communication deficit: Secondary | ICD-10-CM

## 2019-09-18 DIAGNOSIS — R2689 Other abnormalities of gait and mobility: Secondary | ICD-10-CM

## 2019-09-18 DIAGNOSIS — I639 Cerebral infarction, unspecified: Secondary | ICD-10-CM | POA: Diagnosis not present

## 2019-09-18 DIAGNOSIS — R471 Dysarthria and anarthria: Secondary | ICD-10-CM

## 2019-09-18 LAB — CUP PACEART REMOTE DEVICE CHECK
Date Time Interrogation Session: 20201012165316
Implantable Pulse Generator Implant Date: 20200903

## 2019-09-18 NOTE — Therapy (Signed)
[place], [place], [address] Phone: [phone]   Fax:  [phone]  Physical Therapy Treatment  Patient Details  Name: Jessica Harrell MRN: [id number] Date of Birth: [birth year] Referring Provider (PT): [name], MD   Encounter Date: 09/18/2019  PT End of Session - 09/18/19 1213    Visit Number  8    Number of Visits  24    Date for PT Re-Evaluation  10/25/19   Mini-reassess 09/27/19   Authorization Type  [employer] Medicare Visits based on Medical Necessity    Authorization Time Period  08/30/19 - 10/30/19    Authorization - Visit Number  8    Authorization - Number of Visits  10    PT Start Time  1115    PT Stop Time  1205    PT Time Calculation (min)  50 min    Equipment Utilized During Treatment  Gait belt    Activity Tolerance  Patient tolerated treatment well    Behavior During Therapy  [facility] for tasks assessed/performed       Past Medical History:  Diagnosis Date  . Breast cancer ([place])   . Right foot pain Nov. 2014    Past Surgical History:  Procedure Laterality Date  . BREAST BIOPSY    . BUBBLE STUDY  05/18/2019   Procedure: BUBBLE STUDY;  Surgeon: Elouise Munroe, MD;  Location: [facility] ENDOSCOPY;  Service: Cardiology;;  . CATARACT EXTRACTION     both eyes  . EYE SURGERY Left   . MASTECTOMY W/ SENTINEL NODE BIOPSY Left 05/09/2019   Procedure: LEFT MASTECTOMY WITH LEFT AXILLARY SENTINEL LYMPH NODE BIOPSY;  Surgeon: Coralie Keens, MD;  Location: [place];  Service: General;  Laterality: Left;  . TEE WITHOUT CARDIOVERSION N/A 05/18/2019   Procedure: TRANSESOPHAGEAL ECHOCARDIOGRAM (TEE);  Surgeon: Elouise Munroe, MD;  Location: [place];  Service: Cardiology;  Laterality: N/A;    There were no vitals filed for this visit.  Subjective Assessment - 09/18/19 1117    Subjective  Daughter reports working with pt on walking at home, more focus on progressing R foot which is causing pt to  weight-shift appropriately. Spouse and daughter report pt is sleeping a lot lately, trying to figure out if it is medication and get a more scheduled daily plan.    Patient is accompained by:  Family member   Daughter: [name] and spouse [name])   Pertinent History  CVA 05/14/19    Limitations  House hold activities;Standing;Walking    How long can you stand comfortably?  Requires assistance and support of walker    How long can you walk comfortably?  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  No/denies          [facility] Adult PT Treatment/Exercise - 09/18/19 0001      Transfers   Transfers  Sit to Stand    Sit to Stand  3: Mod assist;With upper extremity assist    Sit to Stand Details  Tactile cues for initiation;Verbal cues for sequencing;Verbal cues for technique;Verbal cues for safe use of DME/AE    Comments  verbal and tactile cues to encourage anterior weight shift, pushing through UE on armrest, feet positioned in wider stance, initial cues with sheet under  hips for tactile cues and weight-shifting encouragement; practiced stand pivot transfers with spouse, providing education and cues to improve technique and safety      Neuro Re-ed    Neuro Re-ed Details   Static standing with RW support and mirror positioned in front of pt for visual feedback, tactile cues for weight-shifting into normal postural alingment, increased pessure at hips encouraging increased weight-bearing through flat feet; therapist provided resistance at pt's hips, R/L/forward/backward with UE assist on RW to improve weight-bearing in upright, normal postural alignment and out of posterior lean and R trunk lean      Knee/Hip Exercises: Standing   Functional Squat  2 sets;10 reps    Functional Squat Limitations   mini squat, WC behind, BUE on RW             PT Education - 09/18/19 1119    Education Details  Transfer training, verbal and tactile cues to improve weight-shifting, time of day for gait training and exercise performance    Person(s) Educated  Patient    Methods  Explanation;Demonstration;Handout    Comprehension  Verbalized understanding;Returned demonstration       PT Short Term Goals - 09/04/19 0958      PT SHORT TERM GOAL #1   Title  Patient will be educated on HEP and report regular compliance to improve safety and independence with functional mobility.    Time  4    Period  Weeks    Status  On-going    Target Date  09/27/19        PT Long Term Goals - 09/04/19 0959      PT LONG TERM GOAL #1   Title  Patient will demonstrate ability to perform sit to stand transfer with modified independence to improve overall functional mobility.    Time  8    Period  Weeks    Status  On-going      PT LONG TERM GOAL #2   Title  Patient will demonstrate ability to ambulate 100 feet with LRAD with no more than minimal assistance and minimal tactile and verbal cues for form.    Time  8    Period  Weeks    Status  On-going      PT LONG TERM GOAL #3   Title  Patient will demonstrate improvement on the BBS of at least 6 points indicating improved balance and independence with functional mobility.    Time  8    Period  Weeks    Status  On-going      PT LONG TERM GOAL #4   Title  Patient will demonstrate ability to correct standing posture and maintain standing balance for at least 1 minute with no more than minimal verbal cueing to improve independence with functional mobility.    Time  8    Period  Weeks    Status  On-going            Plan - 09/18/19 1213    Clinical Impression Statement  Session with focus on standing balance, finding normal postural alignment without trunk lean, and transfer training. Pt with improved STS transfers without RW, used tactile cues with  towel under hips initially, improving with maintaining trunk anterior and weight shifting into standing with repeated reps. Transfer training with spouse performing stand pivot transfers, pt demonstrates pulling on spouse and maintains posterior weight through heels increasing risk for falls; educated on pt's foot positioning into wider BOS, pushing from seated surface, and anterior  weight shift prior to completing pivot to reduce risk for falls and strain on spouse. Pt with good response to tactile cues with static standing using therapist resistance encouraging pt's hips into all directions and pushing through bil hips to encourage weight-bearing down through flat feet. Pt with good mini squats performance using RW for support and pushing through flat feet. Pt demonstrates fair balance at times, able to maintain upright with RW to support, but continues to demonstrate posterior weight shift, occasional R side weight shift and can press into tactile cues too heavily at times. Continue to progress as able.    Personal Factors and Comorbidities  Age;Comorbidity 1    Comorbidities  Hx Breast Cancer, S/P CVA    Examination-Activity Limitations  Bathing;Hygiene/Grooming;Squat;Stairs;Bend;Lift;Locomotion Level;Stand;Transfers;Dressing    Examination-Participation Restrictions  Cleaning;Shop;Laundry;Community Activity;Meal Prep    Stability/Clinical Decision Making  Evolving/Moderate complexity    Rehab Potential  Good    PT Frequency  3x / week    PT Duration  8 weeks    PT Treatment/Interventions  ADLs/Self Care Home Management;Aquatic Therapy;Cryotherapy;Electrical Stimulation;Moist Heat;DME Instruction;Gait training;Stair training;Functional mobility training;Therapeutic activities;Therapeutic exercise;Balance training;Neuromuscular re-education;Patient/family education;Manual techniques;Passive range of motion;Dry needling;Energy conservation;Taping    PT Next Visit Plan  Continue static standing with  weight-shifting, finding normal postural alignment. Gait training without BWSS with cues for R step length. STS training to shift weight anterior and bil hip extension, standing balance with mirror feedback, sticky notes to aide in equal step length with gait training.    PT Home Exercise Plan  9/28: seated stepping towards sticky note targets for coordination; 10/12: gait training 3x/day before/after sleep depending on fatigue, heel raises and mini squats at countertop for UE support with family at side    Consulted and Agree with Plan of Care  Patient;Family member/caregiver    Family Member Consulted  spouse [name]), daughter ([place])       Patient will benefit from skilled therapeutic intervention in order to improve the following deficits and impairments:  Abnormal gait, Impaired sensation, Improper body mechanics, Decreased coordination, Decreased mobility, Postural dysfunction, Decreased activity tolerance, Decreased endurance, Decreased strength, Decreased balance, Decreased safety awareness, Difficulty walking, Impaired vision/preception  Visit Diagnosis: Other lack of coordination  Other symptoms and signs involving the nervous system  Other abnormalities of gait and mobility     Problem List Patient Active Problem List   Diagnosis Date Noted  . Acute ischemic stroke ([place]) 05/15/2019  . Acute metabolic encephalopathy [id number]  . Encephalopathy   . History of left breast cancer 05/09/2019  . Malignant neoplasm of lower-inner quadrant of left breast in female, estrogen receptor positive ([place]) 04/26/2018     Talbot Grumbling PT, DPT 09/18/19, 12:40 PM [place] [address] [place], [place], [address] Phone: [phone]   Fax:  [phone]  Name: Jessica Harrell MRN: [id number] Date of Birth: [birth year]

## 2019-09-18 NOTE — Therapy (Signed)
[place], [place], [address] Phone: [phone]   Fax:  [phone]  Speech Language Pathology Treatment  Patient Details  Name: Jessica Harrell MRN: [id number] Date of Birth: 1941-04-09 Referring Provider (SLP): Kathryne Eriksson, MD   Encounter Date: 09/18/2019  End of Session - 09/18/19 1126    Visit Number  6    Number of Visits  9    Date for SLP Re-Evaluation  10/05/19    Authorization Type  [employer] Medicare    SLP Start Time  1030    SLP Stop Time   1115    SLP Time Calculation (min)  45 min    Activity Tolerance  Patient tolerated treatment well       Past Medical History:  Diagnosis Date  . Breast cancer ([place])   . Right foot pain Nov. 2014    Past Surgical History:  Procedure Laterality Date  . BREAST BIOPSY    . BUBBLE STUDY  05/18/2019   Procedure: BUBBLE STUDY;  Surgeon: Elouise Munroe, MD;  Location: [facility] ENDOSCOPY;  Service: Cardiology;;  . CATARACT EXTRACTION     both eyes  . EYE SURGERY Left   . MASTECTOMY W/ SENTINEL NODE BIOPSY Left 05/09/2019   Procedure: LEFT MASTECTOMY WITH LEFT AXILLARY SENTINEL LYMPH NODE BIOPSY;  Surgeon: Coralie Keens, MD;  Location: [place];  Service: General;  Laterality: Left;  . TEE WITHOUT CARDIOVERSION N/A 05/18/2019   Procedure: TRANSESOPHAGEAL ECHOCARDIOGRAM (TEE);  Surgeon: Elouise Munroe, MD;  Location: [place];  Service: Cardiology;  Laterality: N/A;    There were no vitals filed for this visit.  Subjective Assessment - 09/18/19 1038    Subjective  "I slept a lot."    Patient is accompained by:  Family member    Currently in Pain?  No/denies       ADULT SLP TREATMENT - 09/18/19 1046      General Information   Behavior/Cognition  Alert;Cooperative;Pleasant mood    Patient Positioning  Upright in chair    Oral care provided  N/A    HPI  [name] is a 78 y.o. female with history of breast cancer s/p mastectomy on 05/09/2019  and smoker who was discharged on 05/10/2019, did well for 2 days then became weak and lethargic on 05/13/2019, developing confusion and decreased p.o. intake.  On 05/15/2019, she presented to [facility] ED with CT showing bilateral cerebellar and left occipital lobe hypoattenuation.  MRI showed multiple bilateral posterior circulation cerebellar infarcts in the occipital lobes bilaterally, left thalamus and left tectum.  MRI showed diffuse intracranial arthrosclerosis.  Transferred to [facility] for further evaluation and management.  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  2D echo showed an EF of 60 to 65% without cardiac source of embolus identified and negative bubble.  TEE positive for PFO and bidirectional shunt at rest but no evidence of vegetation or clots.  Lower extremity venous Doppler negative for DVT.  Recommended loop recorder for atrial fibrillation monitoring once complete healing of recent mastectomy wound and plans on following with cardiology outpatient.  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 Pt is now referred by her PCP, Dr. Kathryne Eriksson, for outpatient services to  address residual deficits blurred vision (increased difficulty close up), balance deficits, mild cognitive impairment and dysphagia.      Treatment Provided   Treatment provided  Cognitive-Linquistic      Pain Assessment   Pain Assessment  No/denies pain      Cognitive-Linquistic Treatment   Treatment focused on  Dysarthria;Cognition;Patient/family/caregiver education    Skilled Treatment  SLP provided skilled treatment targeting speech intelligibility in conversation, memory strategies, thought organization with divergent naming task, and assisting with making a  daily routine/schedule. Pt continues to need reminders to decrease rate and over articulate. She was also encouraged to respond with "yes" or "no" when listener asks for word clarification rather than to continue repeating the word. Her husband reports that he has been working on this with her at home as well. She completed divergent naming task with 100% acc (10+) with min assist. She matched UNO cards by color (f=4) with min cues.      Assessment / Recommendations / Plan   Plan  Continue with current plan of care      Progression Toward Goals   Progression toward goals  Progressing toward goals       SLP Education - 09/18/19 1124    Education Details  provided daily schedule/routine for Pt/family to work on at home- schedule rest breaks    Person(s) Educated  Patient;Spouse;Child(ren)    Methods  Explanation;Handout    Comprehension  Verbalized understanding       SLP Short Term Goals - 09/18/19 1126      SLP SHORT TERM GOAL #1   Title  Pt will implement memory strategies in functional therapy activities with 90% acc with min cues.    Baseline  25%    Time  4    Period  Weeks    Status  On-going    Target Date  10/05/19      SLP SHORT TERM GOAL #2   Title  Pt will complete selective and alternating attention tasks (moderately complex) with 90% acc and mi/mod cues.    Baseline  limited by visual deficits; distractable, 75% mod cues    Time  4    Period  Weeks    Status  On-going    Target Date  10/05/19      SLP SHORT TERM GOAL #3   Title  Pt will complete moderate-level thought organization and planning activities with 90% acc and min assist.    Baseline  mod assist    Time  4    Period  Weeks    Status  On-going    Target Date  10/05/19      SLP SHORT TERM GOAL #4   Title  Pt will increase speech intelligibility to 90% intelligible in conversations in 1:1 conversations with SLP with min prompts for strategies and cue for error awareness.    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 Pt benefited from role playing scenarios to practice implementation of speech intelligibility strategies. She was given daily schedule to fill out at home with family due to family reporting that Pt "sleeps all the time". SLP would like input from PT/OT to help create a daily routine with scheduled rest breaks.     Speech Therapy Frequency  2x / week    Duration  4 weeks    Treatment/Interventions  SLP instruction and feedback;Compensatory strategies;Patient/family education;Compensatory techniques    Potential to Achieve Goals  Good    Potential Considerations  Ability to learn/carryover information    SLP Home Exercise Plan  Pt will be independent with HEP as assigned to facilitate carrover in home environment    Consulted and Agree with Plan of Care  Patient       Patient will benefit from skilled therapeutic intervention in order to improve the following deficits and impairments:   Cognitive communication deficit  Dysarthria and anarthria    Problem List Patient Active Problem List   Diagnosis Date Noted  . Acute ischemic stroke ([place]) 05/15/2019  . Acute metabolic encephalopathy [id number]  . Encephalopathy   . History of left breast cancer 05/09/2019  . Malignant neoplasm of lower-inner quadrant of left breast in female, estrogen receptor positive [facility]) 04/26/2018   Thank you,  Genene Churn, [place]  [place] 09/18/2019, 11:30  AM  [place], [place], [address] Phone: [phone]   Fax:  [phone]   Name: Jessica Harrell MRN: [id number] Date of Birth: 12-19-1940

## 2019-09-20 ENCOUNTER — Ambulatory Visit (HOSPITAL_COMMUNITY): Payer: Medicare Other | Admitting: Physical Therapy

## 2019-09-20 ENCOUNTER — Encounter (HOSPITAL_COMMUNITY): Payer: Self-pay | Admitting: Physical Therapy

## 2019-09-20 ENCOUNTER — Encounter (HOSPITAL_COMMUNITY): Payer: Self-pay | Admitting: Speech Pathology

## 2019-09-20 ENCOUNTER — Other Ambulatory Visit: Payer: Self-pay

## 2019-09-20 ENCOUNTER — Ambulatory Visit (HOSPITAL_COMMUNITY): Payer: Medicare Other | Admitting: Speech Pathology

## 2019-09-20 DIAGNOSIS — R471 Dysarthria and anarthria: Secondary | ICD-10-CM

## 2019-09-20 DIAGNOSIS — R278 Other lack of coordination: Secondary | ICD-10-CM

## 2019-09-20 DIAGNOSIS — R2689 Other abnormalities of gait and mobility: Secondary | ICD-10-CM

## 2019-09-20 DIAGNOSIS — R29818 Other symptoms and signs involving the nervous system: Secondary | ICD-10-CM

## 2019-09-20 DIAGNOSIS — R41841 Cognitive communication deficit: Secondary | ICD-10-CM

## 2019-09-20 NOTE — Therapy (Signed)
[place], [place], [address] Phone: [phone]   Fax:  [phone]  Speech Language Pathology Treatment  Patient Details  Name: Beverely Hesson MRN: [id number] Date of Birth: 08/06/1941 Referring Provider (SLP): Kathryne Eriksson, MD   Encounter Date: 09/20/2019  End of Session - 09/20/19 1035    Visit Number  7    Number of Visits  9    Date for SLP Re-Evaluation  10/05/19    Authorization Type  UHC Medicare    SLP Start Time  0945    SLP Stop Time   1030    SLP Time Calculation (min)  45 min    Activity Tolerance  Patient tolerated treatment well       Past Medical History:  Diagnosis Date  . Breast cancer ([place])   . Right foot pain Nov. 2014    Past Surgical History:  Procedure Laterality Date  . BREAST BIOPSY    . BUBBLE STUDY  05/18/2019   Procedure: BUBBLE STUDY;  Surgeon: Elouise Munroe, MD;  Location: [facility] ENDOSCOPY;  Service: Cardiology;;  . CATARACT EXTRACTION     both eyes  . EYE SURGERY Left   . MASTECTOMY W/ SENTINEL NODE BIOPSY Left 05/09/2019   Procedure: LEFT MASTECTOMY WITH LEFT AXILLARY SENTINEL LYMPH NODE BIOPSY;  Surgeon: Coralie Keens, MD;  Location: [place];  Service: General;  Laterality: Left;  . TEE WITHOUT CARDIOVERSION N/A 05/18/2019   Procedure: TRANSESOPHAGEAL ECHOCARDIOGRAM (TEE);  Surgeon: Elouise Munroe, MD;  Location: [place];  Service: Cardiology;  Laterality: N/A;    There were no vitals filed for this visit.  Subjective Assessment - 09/20/19 0953    Subjective  "Let's make a pie."    Patient is accompained by:  Family member    Currently in Pain?  No/denies        ADULT SLP TREATMENT - 09/20/19 0001      General Information   Behavior/Cognition  Alert;Cooperative;Pleasant mood    Patient Positioning  Upright in chair    Oral care provided  N/A    HPI  Ms. Shaneequa Blitzer is a 78 y.o. female with history of breast cancer s/p mastectomy on  05/09/2019 and smoker who was discharged on 05/10/2019, did well for 2 days then became weak and lethargic on 05/13/2019, developing confusion and decreased p.o. intake.  On 05/15/2019, she presented to [facility] ED with CT showing bilateral cerebellar and left occipital lobe hypoattenuation.  MRI showed multiple bilateral posterior circulation cerebellar infarcts in the occipital lobes bilaterally, left thalamus and left tectum.  MRI showed diffuse intracranial arthrosclerosis.  Transferred to [facility] for further evaluation and management.  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  2D echo showed an EF of 60 to 65% without cardiac source of embolus identified and negative bubble.  TEE positive for PFO and bidirectional shunt at rest but no evidence of vegetation or clots.  Lower extremity venous Doppler negative for DVT.  Recommended loop recorder for atrial fibrillation monitoring once complete healing of recent mastectomy wound and plans on following with cardiology outpatient.  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 Pt is now referred by her PCP, Dr. Kathryne Eriksson, for outpatient services  to address residual deficits blurred vision (increased difficulty close up), balance deficits, mild cognitive impairment and dysphagia.      Treatment Provided   Treatment provided  Cognitive-Linquistic      Pain Assessment   Pain Assessment  No/denies pain      Cognitive-Linquistic Treatment   Treatment focused on  Dysarthria;Cognition;Patient/family/caregiver education    Skilled Treatment   SLP provided skilled treatment targeting speech intelligibility in functional sentences. Pt and family assisted SLP in creating sentences to practice at home. Pt  encouraged to reduce rate of speech for improved speech intelligibility. SLP also facilitated ongoing assistance with creation of daily routine/schedule at home.       Assessment / Recommendations / Plan   Plan  Continue with current plan of care      Progression Toward Goals   Progression toward goals  Progressing toward goals       SLP Education - 09/20/19 1034    Education Details  Updated daily routine for Pt to follow at home with scheduled rest breaks    Person(s) Educated  Patient;Child(ren);Spouse    Methods  Explanation;Handout    Comprehension  Verbalized understanding       SLP Short Term Goals - 09/20/19 1035      SLP SHORT TERM GOAL #1   Title  Pt will implement memory strategies in functional therapy activities with 90% acc with min cues.    Baseline  25%    Time  4    Period  Weeks    Status  On-going    Target Date  10/05/19      SLP SHORT TERM GOAL #2   Title  Pt will complete selective and alternating attention tasks (moderately complex) with 90% acc and mi/mod cues.    Baseline  limited by visual deficits; distractable, 75% mod cues    Time  4    Period  Weeks    Status  On-going    Target Date  10/05/19      SLP SHORT TERM GOAL #3   Title  Pt will complete moderate-level thought organization and planning activities with 90% acc and min assist.    Baseline  mod assist    Time  4    Period  Weeks    Status  On-going    Target Date  10/05/19      SLP SHORT TERM GOAL #4   Title  Pt will increase speech intelligibility to 90% intelligible in conversations in 1:1 conversations with SLP with min prompts for strategies and cue for error awareness.    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 She was able to attend to specific tasks for ~8 minutes before needing a short break for attention.    Speech Therapy Frequency  2x / week    Duration  4 weeks    Treatment/Interventions  SLP instruction and feedback;Compensatory strategies;Patient/family education;Compensatory techniques    Potential to Achieve Goals  Good    Potential Considerations  Ability to learn/carryover information    SLP Home Exercise Plan  Pt will be independent with HEP as assigned to facilitate carrover in home environment    Consulted and Agree with Plan of Care  Patient       Patient will benefit from skilled therapeutic intervention in order to improve the following deficits and impairments:   Cognitive communication deficit  Dysarthria and anarthria    Problem List Patient Active Problem List   Diagnosis Date Noted  . Acute ischemic stroke ([place]) 05/15/2019  . Acute metabolic encephalopathy [id number]  . Encephalopathy   . History of left breast cancer 05/09/2019  . Malignant neoplasm of lower-inner quadrant of left breast in female, estrogen receptor positive [facility]) 04/26/2018   Thank you,  Genene Churn, [place] 09/20/2019, 10:36 AM  [place] [address] [place], [place], [address] Phone: [phone]   Fax:  [phone]   Name: Sharri Parrado MRN: [id number] Date of Birth: Apr 07, 1941

## 2019-09-20 NOTE — Therapy (Addendum)
[place], [place], [address] Phone: [phone]   Fax:  [phone]  Physical Therapy Treatment  Patient Details  Name: Jessica Harrell MRN: [id number] Date of Birth: Nov 02, 1941 Referring Provider (PT): [name], MD   Encounter Date: 09/20/2019  PT End of Session - 09/20/19 1126    Visit Number  9    Number of Visits  24    Date for PT Re-Evaluation  10/25/19   Mini-reassess 09/27/19   Authorization Type  UHC Medicare Visits based on Medical Necessity    Authorization Time Period  08/30/19 - 10/30/19    Authorization - Visit Number  9    Authorization - Number of Visits  10    PT Start Time  [id number]    PT Stop Time  [id number]    PT Time Calculation (min)  45 min    Equipment Utilized During Treatment  Gait belt;Other (comment)   Rolling walker   Activity Tolerance  Patient tolerated treatment well    Behavior During Therapy  WFL for tasks assessed/performed       Past Medical History:  Diagnosis Date  . Breast cancer ([place])   . Right foot pain Nov. 2014    Past Surgical History:  Procedure Laterality Date  . BREAST BIOPSY    . BUBBLE STUDY  05/18/2019   Procedure: BUBBLE STUDY;  Surgeon: Elouise Munroe, MD;  Location: [facility] ENDOSCOPY;  Service: Cardiology;;  . CATARACT EXTRACTION     both eyes  . EYE SURGERY Left   . MASTECTOMY W/ SENTINEL NODE BIOPSY Left 05/09/2019   Procedure: LEFT MASTECTOMY WITH LEFT AXILLARY SENTINEL LYMPH NODE BIOPSY;  Surgeon: Coralie Keens, MD;  Location: [place];  Service: General;  Laterality: Left;  . TEE WITHOUT CARDIOVERSION N/A 05/18/2019   Procedure: TRANSESOPHAGEAL ECHOCARDIOGRAM (TEE);  Surgeon: Elouise Munroe, MD;  Location: [place];  Service: Cardiology;  Laterality: N/A;    There were no vitals filed for this visit.  Subjective Assessment - 09/20/19 1122    Subjective  Patients daughter says there are no new issues since last session. She notes that  she and patients husband have continued to work on progressing walking with RW in there home.    Patient is accompained by:  Family member   Daughter: [name] and spouse [name])   Pertinent History  CVA 05/14/19    Limitations  House hold activities;Standing;Walking    How long can you stand comfortably?  Requires assistance and support of walker    How long can you walk comfortably?  Requires assistance to walk with a walker 50 feet    Diagnostic tests  MRI 05/16/19: Posterior cericulation involving cerebellum bilaterally occipital lobes bilateral thalamus and left tectum    Patient Stated Goals  Walk better       No/Denies Pain                [facility] Adult PT Treatment/Exercise - 09/20/19 0001      Ambulation/Gait   Ambulation/Gait  Yes    Ambulation/Gait Assistance  3: Mod assist    Ambulation Distance (Feet)  50 Feet    Assistive device  Rolling walker    Gait Pattern  Lateral trunk lean to left;Narrow base of support;Decreased step length - right;Step-to pattern    Ambulation Surface  Level    Gait Comments  Mod A, Max VCs for sequencing BLES, Tactile cueing for RLE advancement  Knee/Hip Exercises: Seated   Sit to Sand  2 sets;5 reps;with UE support   Min A, with blue foam pad on seat         Balance Exercises - 09/20/19 1139      Balance Exercises: Standing   Standing Eyes Opened  Wide (BOA);Solid surface   Therapist perturbations in all directions, 20x 2 sets, c RW   Other Standing Exercises  --   Standing weight shifts front/ back, both feet, x 20 ea c RW     Balance Exercises: Seated   Static Sitting  Eyes opened;No upper extremity support   Therapist perturbations in all planes 20 x 2 sets   Dynamic Sitting  Eyes opened;Reaching outside base of support   reaching forward/ lateral to target 10x 3 sets, RUE/ LUE/BUE         PT Short Term Goals - 09/04/19 [id number]      PT SHORT TERM GOAL #1   Title  Patient will be educated on HEP and report  regular compliance to improve safety and independence with functional mobility.    Time  4    Period  Weeks    Status  On-going    Target Date  09/27/19        PT Long Term Goals - 09/04/19 0959      PT LONG TERM GOAL #1   Title  Patient will demonstrate ability to perform sit to stand transfer with modified independence to improve overall functional mobility.    Time  8    Period  Weeks    Status  On-going      PT LONG TERM GOAL #2   Title  Patient will demonstrate ability to ambulate 100 feet with LRAD with no more than minimal assistance and minimal tactile and verbal cues for form.    Time  8    Period  Weeks    Status  On-going      PT LONG TERM GOAL #3   Title  Patient will demonstrate improvement on the BBS of at least 6 points indicating improved balance and independence with functional mobility.    Time  8    Period  Weeks    Status  On-going      PT LONG TERM GOAL #4   Title  Patient will demonstrate ability to correct standing posture and maintain standing balance for at least 1 minute with no more than minimal verbal cueing to improve independence with functional mobility.    Time  8    Period  Weeks    Status  On-going            Plan - 09/20/19 1127    Clinical Impression Statement  Patient tolerated session well today. Patient performed well with seated perturbations unsupported, patient was able to to adapt well to resisting force in all directions. Patient showed some dysmetria with forward reaching to target task while seated. Patient continues to be challenged with maintaining COG in BOS while standing, and shifts most notably toward rear and left side. Patient was able to correct to some degree with frequent verbal, and tactile cueing to recenter patient within BOS. Patient was able to progress walking distance using RW, with Mod A, Max verbal cueing and Mod tactile input for sequencing of RLE and proper weightshifting when advancing step.    Personal  Factors and Comorbidities  Age;Comorbidity 1    Comorbidities  Hx Breast Cancer, S/P CVA    Examination-Activity Limitations  Bathing;Hygiene/Grooming;Squat;Stairs;Bend;Lift;Locomotion Level;Stand;Transfers;Dressing    Examination-Participation Restrictions  Cleaning;Shop;Laundry;Community Activity;Meal Prep    Stability/Clinical Decision Making  Evolving/Moderate complexity    Rehab Potential  Good    PT Frequency  3x / week    PT Duration  8 weeks    PT Treatment/Interventions  ADLs/Self Care Home Management;Aquatic Therapy;Cryotherapy;Electrical Stimulation;Moist Heat;DME Instruction;Gait training;Stair training;Functional mobility training;Therapeutic activities;Therapeutic exercise;Balance training;Neuromuscular re-education;Patient/family education;Manual techniques;Passive range of motion;Dry needling;Energy conservation;Taping    PT Next Visit Plan  Continue to progress static standing balance, and gait training with RW    PT Home Exercise Plan  9/28: seated stepping towards sticky note targets for coordination; 10/12: gait training 3x/day before/after sleep depending on fatigue, heel raises and mini squats at countertop for UE support with family at side    Consulted and Agree with Plan of Care  Patient;Family member/caregiver    Family Member Consulted  spouse [name]), daughter ([place])       Patient will benefit from skilled therapeutic intervention in order to improve the following deficits and impairments:  Abnormal gait, Impaired sensation, Improper body mechanics, Decreased coordination, Decreased mobility, Postural dysfunction, Decreased activity tolerance, Decreased endurance, Decreased strength, Decreased balance, Decreased safety awareness, Difficulty walking, Impaired vision/preception  Visit Diagnosis: Other symptoms and signs involving the nervous system  Other lack of coordination  Other abnormalities of gait and mobility     Problem List Patient Active Problem  List   Diagnosis Date Noted  . Acute ischemic stroke ([place]) 05/15/2019  . Acute metabolic encephalopathy [id number]  . Encephalopathy   . History of left breast cancer 05/09/2019  . Malignant neoplasm of lower-inner quadrant of left breast in female, estrogen receptor positive ([place]) 04/26/2018    Elizbeth Squires PT DPT  09/20/2019, 11:53 AM  [place], [place], [address] Phone: [phone]   Fax:  [phone]  Name: Jessica Harrell MRN: [id number] Date of Birth: 1941-03-10

## 2019-09-22 ENCOUNTER — Ambulatory Visit (HOSPITAL_COMMUNITY): Payer: Medicare Other | Admitting: Physical Therapy

## 2019-09-22 ENCOUNTER — Encounter (HOSPITAL_COMMUNITY): Payer: Self-pay | Admitting: Occupational Therapy

## 2019-09-22 ENCOUNTER — Ambulatory Visit (HOSPITAL_COMMUNITY): Payer: Medicare Other | Admitting: Occupational Therapy

## 2019-09-22 ENCOUNTER — Other Ambulatory Visit: Payer: Self-pay

## 2019-09-22 ENCOUNTER — Encounter (HOSPITAL_COMMUNITY): Payer: Self-pay | Admitting: Physical Therapy

## 2019-09-22 DIAGNOSIS — R29818 Other symptoms and signs involving the nervous system: Secondary | ICD-10-CM

## 2019-09-22 DIAGNOSIS — R2689 Other abnormalities of gait and mobility: Secondary | ICD-10-CM

## 2019-09-22 DIAGNOSIS — R278 Other lack of coordination: Secondary | ICD-10-CM

## 2019-09-22 NOTE — Therapy (Signed)
[place] [address] [place], [place], [address] Phone: [phone]   Fax:  [phone]  Occupational Therapy Treatment  Patient Details  Name: Jessica Harrell MRN: [id number] Date of Birth: May 22, 1941 Referring Provider (OT): Dr. Kathryne Eriksson   Encounter Date: 09/22/2019  OT End of Session - 09/22/19 1636    Visit Number  5    Number of Visits  8    Date for OT Re-Evaluation  09/29/19    Authorization Type  UHC Medicare    Authorization Time Period  no visit limit    OT Start Time  1117    OT Stop Time  1200    OT Time Calculation (min)  43 min    Activity Tolerance  Patient tolerated treatment well    Behavior During Therapy  [facility] for tasks assessed/performed       Past Medical History:  Diagnosis Date  . Breast cancer ([place])   . Right foot pain Nov. 2014    Past Surgical History:  Procedure Laterality Date  . BREAST BIOPSY    . BUBBLE STUDY  05/18/2019   Procedure: BUBBLE STUDY;  Surgeon: Elouise Munroe, MD;  Location: [facility] ENDOSCOPY;  Service: Cardiology;;  . CATARACT EXTRACTION     both eyes  . EYE SURGERY Left   . MASTECTOMY W/ SENTINEL NODE BIOPSY Left 05/09/2019   Procedure: LEFT MASTECTOMY WITH LEFT AXILLARY SENTINEL LYMPH NODE BIOPSY;  Surgeon: Coralie Keens, MD;  Location: [place];  Service: General;  Laterality: Left;  . TEE WITHOUT CARDIOVERSION N/A 05/18/2019   Procedure: TRANSESOPHAGEAL ECHOCARDIOGRAM (TEE);  Surgeon: Elouise Munroe, MD;  Location: [place];  Service: Cardiology;  Laterality: N/A;    There were no vitals filed for this visit.  Subjective Assessment - 09/22/19 1117    Subjective   S: A monster.    Currently in Pain?  No/denies         [facility] OT Assessment - 09/22/19 1115      Assessment   Medical Diagnosis  s/p CVA      Precautions   Precautions  Fall;Other (comment)    Precaution Comments  Visual deficit- cortical blindness. Use contrast for objects during session.                 OT Treatments/Exercises (OP) - 09/22/19 1137      Exercises   Exercises  Theraputty;Hand      Hand Exercises   Hand Gripper with Large Beads  right: 5 beads gripper set at 25# Left: 4 beads gripper set at 20#    Hand Gripper with Medium Beads  right: 7 beads gripper at 22# left: 7 beads gripper set at 20#      Visual/Perceptual Exercises   Other Exercises  Pt attempting large print crossword puzzle positioned on black construction paper for contrast. Paper taped on vertical surface to encourage pt to lift elbow off table and for positioning directly in front of visual field. OT using red construction paper for beginning reference point and pt attempting to write letters in boxes. With vertical boxes pt writes approximately 1 inch to the right, with horizontal boxes pt writes approximately 1 inch inferior to the box. Pt then worked on writing answers on blank paper on tabletop, with 50% legibility. Pt handwriting becomes smaller as fatigue increases.                OT Short Term Goals - 09/01/19 1600  OT SHORT TERM GOAL #1   Title  Pt will be provided with and educated on HEP to improve safety and independence in ADL completion.    Time  4    Period  Weeks    Status  On-going    Target Date  09/30/19      OT SHORT TERM GOAL #2   Title  Pt will be educated on appropriate AE to improve independence in eating, handwriting, and self-care tasks to compensate for visual deficits and ataxia.    Time  4    Period  Weeks    Status  On-going      OT SHORT TERM GOAL #3   Title  Pt will increase bilateral grip strength by 10# and pinch strength by 2# to improve ability to grasp and maintain hold on objects during functional tasks.    Time  4    Period  Weeks    Status  On-going      OT SHORT TERM GOAL #4   Title  Pt will improve handwriting legibility by 25%, using compensatory strategies as needed, to improve independence in signing checks.    Time  4     Period  Weeks    Status  On-going      OT SHORT TERM GOAL #5   Title  Pt will demonstrate improvement in fine motor coordination by achieving at least 20 blocks with bilateral hands during box and blocks test.    Time  4    Period  Weeks    Status  On-going               Plan - 09/22/19 1637    Clinical Impression Statement  A: Daughter reports pt is continuing to practice visual skills while tracing in workbook. Daughter also downloaded some games onto ipad and pt has been working on those for visual scanning. Beginning of session working on visual perception with large print crossword puzzle. Pt continues to have 1 inch inferior or right difference in visual perception. Also worked on bilateral grip strength, verbal cuing to wrap left fingers around gripper when horizontal for success with picking up beads.    Body Structure / Function / Physical Skills  ADL;Endurance;UE functional use;Vision;Body mechanics;Proprioception;Coordination;IADL;Strength    Plan  P: Continue working on vision, compensatory strategies for task completion. Add proximal shoulder strengthening/stability activity for LUE       Patient will benefit from skilled therapeutic intervention in order to improve the following deficits and impairments:   Body Structure / Function / Physical Skills: ADL, Endurance, UE functional use, Vision, Body mechanics, Proprioception, Coordination, IADL, Strength       Visit Diagnosis: Other symptoms and signs involving the nervous system  Other lack of coordination    Problem List Patient Active Problem List   Diagnosis Date Noted  . Acute ischemic stroke ([place]) 05/15/2019  . Acute metabolic encephalopathy [id number]  . Encephalopathy   . History of left breast cancer 05/09/2019  . Malignant neoplasm of lower-inner quadrant of left breast in female, estrogen receptor positive ([place]) 04/26/2018   Guadelupe Sabin, OTR/L  [phone] 09/22/2019, 4:40 PM  [place], [place], [address] Phone: [phone]   Fax:  [phone]  Name: Jessica [place] MRN: [id number] Date of Birth: Dec 31, 1940

## 2019-09-22 NOTE — Therapy (Addendum)
[place], [place], [address] Phone: [phone]   Fax:  [phone]  Physical Therapy Treatment/ Progress Note  Patient Details  Name: Jessica Harrell MRN: [id number] Date of Birth: 18-Oct-1941 Referring Provider (PT): Kathryne Eriksson MD   Encounter Date: 09/22/2019   Progress Note Reporting Period 08/30/19 to 09/22/19  See note below for Objective Data and Assessment of Progress/Goals.     PT End of Session - 09/22/19 1129    Visit Number  10    Number of Visits  24    Date for PT Re-Evaluation  10/25/19   Mini-reassess completed 09/22/19   Authorization Type  [employer] Medicare Visits based on Medical Necessity    Authorization Time Period  08/30/19 - 10/30/19    Authorization - Visit Number  10    Authorization - Number of Visits  20    PT Start Time  1037    PT Stop Time  1115    PT Time Calculation (min)  38 min    Equipment Utilized During Treatment  Gait belt;Other (comment)   Rolling walker   Activity Tolerance  Patient tolerated treatment well    Behavior During Therapy  WFL for tasks assessed/performed       Past Medical History:  Diagnosis Date  . Breast cancer ([place])   . Right foot pain Nov. 2014    Past Surgical History:  Procedure Laterality Date  . BREAST BIOPSY    . BUBBLE STUDY  05/18/2019   Procedure: BUBBLE STUDY;  Surgeon: Elouise Munroe, MD;  Location: [facility] END[facility]COPY;  Service: Cardiology;;  . CATARACT EXTRACTION     both eyes  . EYE SURGERY Left   . MASTECTOMY W/ SENTINEL NODE BIOPSY Left 05/09/2019   Procedure: LEFT MASTECTOMY WITH LEFT AXILLARY SENTINEL LYMPH NODE BIOPSY;  Surgeon: Coralie Keens, MD;  Location: [place];  Service: General;  Laterality: Left;  . TEE WITHOUT CARDIOVERSION N/A 05/18/2019   Procedure: TRANSESOPHAGEAL ECHOCARDIOGRAM (TEE);  Surgeon: Elouise Munroe, MD;  Location: [place];  Service: Cardiology;  Laterality: N/A;    There were no vitals filed  for this visit.  Subjective Assessment - 09/22/19 1126    Subjective  Patient presents with daughter and husband today. Family memebers say patient has not done as much walking in home since last visit, and that patient was tired from increased activity. They say she took a nap after last treatment because she was tired, but that she is doing well otherwise.    Patient is accompained by:  Family member   Daughter: [name] and spouse [name])   Pertinent History  CVA 05/14/19    Limitations  House hold activities;Standing;Walking    How long can you stand comfortably?  Requires assistance and support of walker    How long can you walk comfortably?  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  No/denies         [facility] PT Assessment - 09/22/19 0001      Assessment   Medical Diagnosis  s/p CVA    Referring Provider (PT)  Kathryne Eriksson MD    Onset Date/Surgical Date  05/14/19    Hand Dominance  Right      Precautions   Precautions  Fall  Prior Function   Level of Independence  Independent      Ambulation/Gait   Ambulation/Gait  Yes    Ambulation/Gait Assistance  3: Mod assist                   [facility] Adult PT Treatment/Exercise - 09/22/19 0001      Transfers   Transfers  Sit to Stand    Sit to Stand  4: Min assist    Sit to Stand Details  Verbal cues for technique      Ambulation/Gait   Ambulation Distance (Feet)  --   50 feet x 2   Assistive device  Rolling walker    Gait Pattern  Lateral trunk lean to left;Narrow base of support;Decreased step length - right;Step-to pattern    Ambulation Surface  Level    Gait Comments  Mod A, Mod VCs for sequencing BLES, Tactile cueing for RLE advancement      Knee/Hip Exercises: Seated   Other Seated Knee/Hip Exercises   Randomized BLE marching, cognitive practice 2 x20    Sit to Sand  2 sets;5 reps;with UE support          Balance Exercises - 09/22/19 1152      Balance Exercises: Standing   Other Standing Exercises  standing WS, both feet, staggered stance, x 20 each, CG      Balance Exercises: Seated   Static Sitting  Eyes opened;No upper extremity support        PT Education - 09/22/19 1127    Education Details  Patient and family members educated on gait sequencing using rolling walker, to avoid elongated steps with LLE, and maintaining BOS within walker.    Person(s) Educated  Patient;Child(ren);Spouse    Methods  Explanation;Demonstration    Comprehension  Verbalized understanding       PT Short Term Goals - 09/22/19 1131      PT SHORT TERM GOAL #1   Title  Patient will be educated on HEP and report regular compliance to improve safety and independence with functional mobility.    Time  4    Period  Weeks    Status  Achieved    Target Date  09/27/19        PT Long Term Goals - 09/22/19 1132      PT LONG TERM GOAL #1   Title  Patient will demonstrate ability to perform sit to stand transfer with modified independence to improve overall functional mobility.    Baseline  09/22/19- Min A, cueing for forward momentum    Time  8    Period  Weeks    Status  On-going      PT LONG TERM GOAL #2   Title  Patient will demonstrate ability to ambulate 100 feet with LRAD with no more than minimal assistance and minimal tactile and verbal cues for form.    Baseline  1-/16/20- able to ambulate 50' and 52' with rest break, using RW on level surface with Min A and frequent cueing for BLE sequencing, and keeping BOS within RW.    Time  8    Period  Weeks    Status  On-going      PT LONG TERM GOAL #3   Title  Patient will demonstrate improvement on the BBS of at least 6 points indicating improved balance and independence with functional mobility.    Time  8    Period  Weeks    Status   Deferred  PT LONG TERM GOAL #4   Title  Patient will demonstrate ability to correct standing posture and maintain standing balance for at least 1 minute with no more than minimal verbal cueing to improve independence with functional mobility.    Baseline  Able to do, but needs frequent cueing to avoid left leaning    Time  8    Period  Weeks    Status  On-going            Plan - 09/22/19 1131    Clinical Impression Statement  Patient is making good progress to LTGs. Patient was able to increase walking distance but continue to require Min A to maintaing balance, and needs mod cueing on BLE sequencgin, step length, and keeping feet within RW BOS. Patient does show improved carry over with these verbal cues from last visit, but gait deficits continue, especially with fatigue. Patient is improving with standing static balance and weight shifting, but is still challenged with dynamic stabilty. Patient will continue to benefit from skilled physical therapy to address these deficits to improved LOF with ADLs and functional mobility.    Personal Factors and Comorbidities  Age;Comorbidity 1    Comorbidities  Hx Breast Cancer, S/P CVA    Examination-Activity Limitations  Bathing;Hygiene/Grooming;Squat;Stairs;Bend;Lift;Locomotion Level;Stand;Transfers;Dressing    Examination-Participation Restrictions  Cleaning;Shop;Laundry;Community Activity;Meal Prep    Stability/Clinical Decision Making  Evolving/Moderate complexity    Rehab Potential  Good    PT Frequency  3x / week    PT Duration  8 weeks    PT Treatment/Interventions  ADLs/Self Care Home Management;Aquatic Therapy;Cryotherapy;Electrical Stimulation;Moist Heat;DME Instruction;Gait training;Stair training;Functional mobility training;Therapeutic activities;Therapeutic exercise;Balance training;Neuromuscular re-education;Patient/family education;Manual techniques;Passive range of motion;Dry needling;Energy conservation;Taping    PT Next Visit  Plan  Continue to progress walking distance, static and dynamic standing balance as able, Perform BERG next visit    PT Home Exercise Plan  9/28: seated stepping towards sticky note targets for coordination; 10/12: gait training 3x/day before/after sleep depending on fatigue, heel raises and mini squats at countertop for UE support with family at side    Consulted and Agree with Plan of Care  Patient;Family member/caregiver    Family Member Consulted  spouse [name]), daughter ([place])       Patient will benefit from skilled therapeutic intervention in order to improve the following deficits and impairments:  Abnormal gait, Impaired sensation, Improper body mechanics, Decreased coordination, Decreased mobility, Postural dysfunction, Decreased activity tolerance, Decreased endurance, Decreased strength, Decreased balance, Decreased safety awareness, Difficulty walking, Impaired vision/preception  Visit Diagnosis: Other symptoms and signs involving the nervous system  Other lack of coordination  Other abnormalities of gait and mobility     Problem List Patient Active Problem List   Diagnosis Date Noted  . Acute ischemic stroke ([place]) 05/15/2019  . Acute metabolic encephalopathy [id number]  . Encephalopathy   . History of left breast cancer 05/09/2019  . Malignant neoplasm of lower-inner quadrant of left breast in female, estrogen receptor positive ([place]) 04/26/2018   Josue Hector PT DPT   Clarene Critchley 09/22/2019, 4:38 PM  [facility] [address] [place], [place], [address] Phone: [phone]   Fax:  [phone]  Name: Jessica Harrell MRN: [id number] Date of Birth: [birth year]

## 2019-09-25 ENCOUNTER — Other Ambulatory Visit: Payer: Self-pay

## 2019-09-25 ENCOUNTER — Ambulatory Visit (HOSPITAL_COMMUNITY): Payer: Medicare Other | Admitting: Speech Pathology

## 2019-09-25 ENCOUNTER — Encounter (HOSPITAL_COMMUNITY): Payer: Self-pay | Admitting: Speech Pathology

## 2019-09-25 ENCOUNTER — Ambulatory Visit (HOSPITAL_COMMUNITY): Payer: Medicare Other

## 2019-09-25 ENCOUNTER — Encounter (HOSPITAL_COMMUNITY): Payer: Self-pay

## 2019-09-25 DIAGNOSIS — R278 Other lack of coordination: Secondary | ICD-10-CM

## 2019-09-25 DIAGNOSIS — R29818 Other symptoms and signs involving the nervous system: Secondary | ICD-10-CM | POA: Diagnosis not present

## 2019-09-25 DIAGNOSIS — R41841 Cognitive communication deficit: Secondary | ICD-10-CM

## 2019-09-25 DIAGNOSIS — R2689 Other abnormalities of gait and mobility: Secondary | ICD-10-CM

## 2019-09-25 DIAGNOSIS — R471 Dysarthria and anarthria: Secondary | ICD-10-CM

## 2019-09-25 NOTE — Therapy (Signed)
[place], [place], [address] Phone: [phone]   Fax:  [phone]  Physical Therapy Treatment  Patient Details  Name: Jessica Harrell MRN: [id number] Date of Birth: 12-15-1940 Referring Provider (PT): Kathryne Eriksson MD   Encounter Date: 09/25/2019  PT End of Session - 09/25/19 1214    Visit Number  11    Number of Visits  24    Date for PT Re-Evaluation  10/25/19   Mini-reassess completed 09/22/19   Authorization Type  [employer] Medicare Visits based on Medical Necessity    Authorization Time Period  08/30/19 - 10/30/19    Authorization - Visit Number  11    Authorization - Number of Visits  20    PT Start Time  0950    PT Stop Time  1031    PT Time Calculation (min)  41 min    Equipment Utilized During Treatment  Gait belt;Other (comment)   Rolling walker   Activity Tolerance  Patient tolerated treatment well    Behavior During Therapy  WFL for tasks assessed/performed       Past Medical History:  Diagnosis Date  . Breast cancer ([place])   . Right foot pain Nov. 2014    Past Surgical History:  Procedure Laterality Date  . BREAST BIOPSY    . BUBBLE STUDY  05/18/2019   Procedure: BUBBLE STUDY;  Surgeon: Elouise Munroe, MD;  Location: [facility] ENDOSCOPY;  Service: Cardiology;;  . CATARACT EXTRACTION     both eyes  . EYE SURGERY Left   . MASTECTOMY W/ SENTINEL NODE BIOPSY Left 05/09/2019   Procedure: LEFT MASTECTOMY WITH LEFT AXILLARY SENTINEL LYMPH NODE BIOPSY;  Surgeon: Coralie Keens, MD;  Location: [place];  Service: General;  Laterality: Left;  . TEE WITHOUT CARDIOVERSION N/A 05/18/2019   Procedure: TRANSESOPHAGEAL ECHOCARDIOGRAM (TEE);  Surgeon: Elouise Munroe, MD;  Location: [place];  Service: Cardiology;  Laterality: N/A;    There were no vitals filed for this visit.  Subjective Assessment - 09/25/19 0954    Subjective  Pt's spouse reports pt fell getting off potty chair, he lowered her to  floor slowly. Pt reports she rolled out of bed Saturday morning and landed on her butt, spouse found her on all 21's. Pt now reporting mid back pain, but denies pain with vertebral palpation and rib palpation.    Patient is accompained by:  Family member   Daughter: [name] and spouse [name])   Pertinent History  CVA 05/14/19    Limitations  House hold activities;Standing;Walking    How long can you stand comfortably?  Requires assistance and support of walker    How long can you walk comfortably?  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  No/denies         [facility] PT Assessment - 09/25/19 0001      Berg Balance Test   Sit to Stand  Needs moderate or maximal assist to stand    Standing Unsupported  Unable to stand 30 seconds unassisted    Sitting with Back Unsupported but Feet Supported on Floor or Stool  Able to sit safely and securely 2 minutes    Stand to Sit  Sits independently, has uncontrolled descent    Transfers  Needs  one person to assist    Standing Unsupported with Eyes Closed  Needs help to keep from falling    Standing Unsupported with Feet Together  Needs help to attain position and unable to hold for 15 seconds    From Standing, Reach Forward with Outstretched Arm  Loses balance while trying/requires external support    From Standing Position, Pick up Object from Floor  Unable to try/needs assist to keep balance    From Standing Position, Turn to Look Behind Over each Shoulder  Needs assist to keep from losing balance and falling    Turn 360 Degrees  Needs assistance while turning    Standing Unsupported, Alternately Place Feet on Step/Stool  Needs assistance to keep from falling or unable to try    Standing Unsupported, One Foot in [place] balance while stepping or  standing    Standing on One Leg  Unable to try or needs assist to prevent fall    Total Score  6    Berg comment:  was 5            [facility] Adult PT Treatment/Exercise - 09/25/19 0001      Transfers   Transfers  Sit to Stand    Sit to Stand  3: Mod assist    Sit to Stand Details  Verbal cues for technique;Tactile cues for sequencing;Visual cues/gestures for sequencing    Comments  using RW      Neuro Re-ed    Neuro Re-ed Details   Static standing with RW support, tactile cues for weight-shifting into normal postural alignment, equal weight-bearing through bil flat feet; therapist provided resistance at pt's hips, R/L/forward/backward with UE assist on RW to improve weight-bearing in upright, normal postural alignment and out of posterior lean and R trunk lean; SLS with BUE assist on RW, encouraging proprioception and weight-bearing through entire plantar surface, activating obliques to promote trunk alignment; quadruped on mat surface, assistance for 90/90 position and hips and shoulders, decreasing collapsing weight-shift to R side      Knee/Hip Exercises: Seated   Other Seated Knee/Hip Exercises  lateral scooting on mat table, encouraging weight shifting into bil flat feet, approrpiate hip-head relationship, coordination of full body movement and strengthening to improve funcitonal task             PT Education - 09/25/19 0956    Education Details  Assessment for possible fractures/tenderness from falls- reports some discomfort with rib palpation but denies with coughing or breathing so possible soft tissue injury caunsing pain, exercise technique, udpated HEP    Person(s) Educated  Patient;Spouse;Child(ren)    Methods  Explanation;Demonstration;Tactile cues;Verbal cues    Comprehension  Verbalized understanding;Returned demonstration       PT Short Term Goals - 09/22/19 1131      PT SHORT TERM GOAL #1   Title  Patient will be educated on HEP and report regular compliance  to improve safety and independence with functional mobility.    Time  4    Period  Weeks    Status  Achieved    Target Date  09/27/19        PT Long Term Goals - 09/22/19 1132      PT LONG TERM GOAL #1   Title  Patient will demonstrate ability to perform sit to stand transfer with modified independence to improve overall functional mobility.    Baseline  09/22/19- Min A, cueing for forward momentum    Time  8  Period  Weeks    Status  On-going      PT LONG TERM GOAL #2   Title  Patient will demonstrate ability to ambulate 100 feet with LRAD with no more than minimal assistance and minimal tactile and verbal cues for form.    Baseline  1-/16/20- able to ambulate 50' and 61' with rest break, using RW on level surface with Min A and frequent cueing for BLE sequencing, and keeping BOS within RW.    Time  8    Period  Weeks    Status  On-going      PT LONG TERM GOAL #3   Title  Patient will demonstrate improvement on the BBS of at least 6 points indicating improved balance and independence with functional mobility.    Time  8    Period  Weeks    Status  Deferred      PT LONG TERM GOAL #4   Title  Patient will demonstrate ability to correct standing posture and maintain standing balance for at least 1 minute with no more than minimal verbal cueing to improve independence with functional mobility.    Baseline  Able to do, but needs frequent cueing to avoid left leaning    Time  8    Period  Weeks    Status  On-going            Plan - 09/25/19 1214    Clinical Impression Statement  Pt completed BERG balance assessment this date scoring 6, indicating 1 point improvement. Continued with transfer training this date, verbal, tactile and visual cues use to encourage forward weight shift into bil flat feet and to avoid hip thrusting forward and trunk extension with STS transfers. Pt continues to require moderate assist with STS and stand pivot transfers due to poor consistent  carryover, with occasional contact guard assist when pt able to push from [facility] armrests and maintain hip-head relation forward to rise from seated surface. Pt tolerates SLS for 30 sec with BUE support on RW and moderate assist from therapist to maintain upright posture. Pt requires moderate assist to assume quadruped positioning on therapy mat, verbal and tactile cues to improve 90/90 angles with extremities and minimal assist for isometric hold in position. Pt fatigues quickly in quadruped position, only able to tolerate for 20 sec and then 5 sec before transitioning out. Pt able to scoot laterally on therapy mat in sitting to encourage appropriate head-hip relationship, forward weight shift into feet, and coordination of extremities to move down mat. Continue to progress as able.    Personal Factors and Comorbidities  Age;Comorbidity 1    Comorbidities  Hx Breast Cancer, S/P CVA    Examination-Activity Limitations  Bathing;Hygiene/Grooming;Squat;Stairs;Bend;Lift;Locomotion Level;Stand;Transfers;Dressing    Examination-Participation Restrictions  Cleaning;Shop;Laundry;Community Activity;Meal Prep    Stability/Clinical Decision Making  Evolving/Moderate complexity    Rehab Potential  Good    PT Frequency  3x / week    PT Duration  8 weeks    PT Treatment/Interventions  ADLs/Self Care Home Management;Aquatic Therapy;Cryotherapy;Electrical Stimulation;Moist Heat;DME Instruction;Gait training;Stair training;Functional mobility training;Therapeutic activities;Therapeutic exercise;Balance training;Neuromuscular re-education;Patient/family education;Manual techniques;Passive range of motion;Dry needling;Energy conservation;Taping    PT Next Visit Plan  Quadruped with support at trunk, UE reaching. Continue to progress walking distance, static and dynamic standing balance as able.    PT Home Exercise Plan  9/28: seated stepping towards sticky note targets for coordination; 10/12: gait training 3x/day before/after  sleep depending on fatigue, heel raises and mini squats at countertop  for UE support with family at side; 10/19: lateral scooting while seated EOB from HOB<>foot of bed    Consulted and Agree with Plan of Care  Patient;Family member/caregiver    Family Member Consulted  spouse [name]), daughter ([place])       Patient will benefit from skilled therapeutic intervention in order to improve the following deficits and impairments:  Abnormal gait, Impaired sensation, Improper body mechanics, Decreased coordination, Decreased mobility, Postural dysfunction, Decreased activity tolerance, Decreased endurance, Decreased strength, Decreased balance, Decreased safety awareness, Difficulty walking, Impaired vision/preception  Visit Diagnosis: Other symptoms and signs involving the nervous system  Other lack of coordination  Other abnormalities of gait and mobility     Problem List Patient Active Problem List   Diagnosis Date Noted  . Acute ischemic stroke ([place]) 05/15/2019  . Acute metabolic encephalopathy [id number]  . Encephalopathy   . History of left breast cancer 05/09/2019  . Malignant neoplasm of lower-inner quadrant of left breast in female, estrogen receptor positive ([place]) 04/26/2018    Talbot Grumbling PT, DPT 09/25/19, 12:27 PM [place], [place], [address] Phone: [phone]   Fax:  [phone]  Name: Mirakle Meola MRN: [id number] Date of Birth: Apr 10, 1941

## 2019-09-25 NOTE — Therapy (Signed)
[place], [place], [address] Phone: [phone]   Fax:  [phone]  Speech Language Pathology Treatment  Patient Details  Name: Jessica Harrell MRN: [id number] Date of Birth: 1941/02/11 Referring Provider (SLP): Kathryne Eriksson, MD   Encounter Date: 09/25/2019  End of Session - 09/25/19 1040    Visit Number  8    Number of Visits  9    Date for SLP Re-Evaluation  10/05/19    Authorization Type  [employer] Medicare    SLP Start Time  1030    SLP Stop Time   1115    SLP Time Calculation (min)  45 min    Activity Tolerance  Patient tolerated treatment well       Past Medical History:  Diagnosis Date  . Breast cancer ([place])   . Right foot pain Nov. 2014    Past Surgical History:  Procedure Laterality Date  . BREAST BIOPSY    . BUBBLE STUDY  05/18/2019   Procedure: BUBBLE STUDY;  Surgeon: Elouise Munroe, MD;  Location: [facility] ENDOSCOPY;  Service: Cardiology;;  . CATARACT EXTRACTION     both eyes  . EYE SURGERY Left   . MASTECTOMY W/ SENTINEL NODE BIOPSY Left 05/09/2019   Procedure: LEFT MASTECTOMY WITH LEFT AXILLARY SENTINEL LYMPH NODE BIOPSY;  Surgeon: Coralie Keens, MD;  Location: [place];  Service: General;  Laterality: Left;  . TEE WITHOUT CARDIOVERSION N/A 05/18/2019   Procedure: TRANSESOPHAGEAL ECHOCARDIOGRAM (TEE);  Surgeon: Elouise Munroe, MD;  Location: [place];  Service: Cardiology;  Laterality: N/A;    There were no vitals filed for this visit.  Subjective Assessment - 09/25/19 1040    Subjective  "I had a fall."    Patient is accompained by:  Family member    Currently in Pain?  No/denies       09/25/19 1044  General Information  Behavior/Cognition Alert;Cooperative;Pleasant mood  Patient Positioning Upright in chair  Oral care provided N/A  HPI [name] is a 78 y.o. female with history of breast cancer s/p mastectomy on 05/09/2019 and smoker who was discharged on  05/10/2019, did well for 2 days then became weak and lethargic on 05/13/2019, developing confusion and decreased p.o. intake.  On 05/15/2019, she presented to [facility] ED with CT showing bilateral cerebellar and left occipital lobe hypoattenuation.  MRI showed multiple bilateral posterior circulation cerebellar infarcts in the occipital lobes bilaterally, left thalamus and left tectum.  MRI showed diffuse intracranial arthrosclerosis.  Transferred to [facility] for further evaluation and management.  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  2D echo showed an EF of 60 to 65% without cardiac source of embolus identified and negative bubble.  TEE positive for PFO and bidirectional shunt at rest but no evidence of vegetation or clots.  Lower extremity venous Doppler negative for DVT.  Recommended loop recorder for atrial fibrillation monitoring once complete healing of recent mastectomy wound and plans on following with cardiology outpatient.  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 Pt is now referred by her PCP, Dr. Kathryne Eriksson, for outpatient services to address residual deficits blurred vision (increased difficulty close up), balance deficits, mild cognitive impairment and dysphagia.  Treatment Provided  Treatment provided Cognitive-Linquistic  Pain Assessment  Pain Assessment No/denies pain  Cognitive-Linquistic Treatment  Treatment focused on Dysarthria;Cognition;Patient/family/caregiver education  Skilled Treatment  SLP provided skilled treatment targeting speech intelligibility in conversation and structured picture description sentence generation tasks and attention and word finding during abstract divergent naming tasks. Pt continues to need  occasional reminders to decrease rate of speech, but overall speech intelligiblity judged to be 95% acc. She completed abstract divergent naming tasks with 100% for 5 items in each category with min cues.   Assessment / Recommendations / Plan  Plan Continue with current plan of care  Progression Toward Goals  Progression toward goals Progressing toward goals    SLP Short Term Goals - 09/25/19 1041      SLP SHORT TERM GOAL #1   Title  Pt will implement memory strategies in functional therapy activities with 90% acc with min cues.    Baseline  25%    Time  4    Period  Weeks    Status  On-going    Target Date  10/05/19      SLP SHORT TERM GOAL #2   Title  Pt will complete selective and alternating attention tasks (moderately complex) with 90% acc and mi/mod cues.    Baseline  limited by visual deficits; distractable, 75% mod cues    Time  4    Period  Weeks    Status  On-going    Target Date  10/05/19      SLP SHORT TERM GOAL #3   Title  Pt will complete moderate-level thought organization and planning activities with 90% acc and min assist.    Baseline  mod assist    Time  4    Period  Weeks    Status  On-going    Target Date  10/05/19      SLP SHORT TERM GOAL #4   Title  Pt will increase speech intelligibility to 90% intelligible in conversations in 1:1 conversations with SLP with min prompts for strategies and cue for error awareness.    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 Pt was accompanied by her spouse and daughter today. They forgot to bring her folder and report limited follow through of daily sleep routine (daughter was out of town for the weekend). Her husband expressed that he is hoping that they can find some extra support at home to give him a chance to run errands etc. Pt has one more scheduled session and goals will be addressed with Pt and family.    Speech Therapy Frequency  2x / week    Duration  4 weeks    Treatment/Interventions  SLP instruction and feedback;Compensatory strategies;Patient/family education;Compensatory techniques    Potential to Achieve Goals  Good    Potential Considerations  Ability to learn/carryover information    SLP Home Exercise Plan  Pt will be independent with HEP as assigned to facilitate carrover in home environment    Consulted and Agree with Plan of Care  Patient       Patient will benefit from skilled therapeutic intervention in order to improve the following deficits and impairments:   Cognitive communication deficit  Dysarthria and anarthria    Problem List Patient Active Problem List   Diagnosis Date Noted  . Acute ischemic stroke ([place]) 05/15/2019  . Acute metabolic encephalopathy [id number]  . Encephalopathy   . History of left breast cancer 05/09/2019  . Malignant neoplasm of lower-inner quadrant of left breast in female, estrogen receptor positive [facility]) 04/26/2018   Thank you,  Genene Churn, [place]  [place] 09/25/2019, 10:45 AM  [place] [address] [place], [place], [address] Phone: [phone]   Fax:  [phone]   Name: Jessica Harrell MRN: [id number] Date of Birth: December 03, 1941

## 2019-09-27 ENCOUNTER — Other Ambulatory Visit: Payer: Self-pay

## 2019-09-27 ENCOUNTER — Encounter (HOSPITAL_COMMUNITY): Payer: Self-pay | Admitting: Speech Pathology

## 2019-09-27 ENCOUNTER — Ambulatory Visit: Payer: Medicare Other | Admitting: Adult Health

## 2019-09-27 ENCOUNTER — Ambulatory Visit (HOSPITAL_COMMUNITY): Payer: Medicare Other

## 2019-09-27 ENCOUNTER — Ambulatory Visit (HOSPITAL_COMMUNITY): Payer: Medicare Other | Admitting: Speech Pathology

## 2019-09-27 ENCOUNTER — Ambulatory Visit (HOSPITAL_COMMUNITY): Payer: Medicare Other | Admitting: Physical Therapy

## 2019-09-27 ENCOUNTER — Encounter (HOSPITAL_COMMUNITY): Payer: Self-pay

## 2019-09-27 ENCOUNTER — Other Ambulatory Visit: Payer: Self-pay | Admitting: Hematology and Oncology

## 2019-09-27 ENCOUNTER — Encounter (HOSPITAL_COMMUNITY): Payer: Self-pay | Admitting: Physical Therapy

## 2019-09-27 DIAGNOSIS — R278 Other lack of coordination: Secondary | ICD-10-CM

## 2019-09-27 DIAGNOSIS — R29818 Other symptoms and signs involving the nervous system: Secondary | ICD-10-CM

## 2019-09-27 DIAGNOSIS — R2689 Other abnormalities of gait and mobility: Secondary | ICD-10-CM

## 2019-09-27 DIAGNOSIS — R41841 Cognitive communication deficit: Secondary | ICD-10-CM

## 2019-09-27 DIAGNOSIS — R471 Dysarthria and anarthria: Secondary | ICD-10-CM

## 2019-09-27 NOTE — Therapy (Signed)
[place], [place], [address] Phone: [phone]   Fax:  [phone]  Speech Language Pathology Treatment  Patient Details  Name: Jessica Harrell MRN: [id number] Date of Birth: March 10, 1941 Referring Provider (SLP): Kathryne Eriksson, MD   Encounter Date: 09/27/2019  End of Session - 09/27/19 1127    Visit Number  9    Number of Visits  9    Date for SLP Re-Evaluation  10/05/19    Authorization Type  [employer] Medicare    SLP Start Time  1030    SLP Stop Time   1115    SLP Time Calculation (min)  45 min    Activity Tolerance  Patient tolerated treatment well       Past Medical History:  Diagnosis Date  . Breast cancer ([place])   . Right foot pain Nov. 2014    Past Surgical History:  Procedure Laterality Date  . BREAST BIOPSY    . BUBBLE STUDY  05/18/2019   Procedure: BUBBLE STUDY;  Surgeon: Elouise Munroe, MD;  Location: [facility] ENDOSCOPY;  Service: Cardiology;;  . CATARACT EXTRACTION     both eyes  . EYE SURGERY Left   . MASTECTOMY W/ SENTINEL NODE BIOPSY Left 05/09/2019   Procedure: LEFT MASTECTOMY WITH LEFT AXILLARY SENTINEL LYMPH NODE BIOPSY;  Surgeon: Coralie Keens, MD;  Location: [place];  Service: General;  Laterality: Left;  . TEE WITHOUT CARDIOVERSION N/A 05/18/2019   Procedure: TRANSESOPHAGEAL ECHOCARDIOGRAM (TEE);  Surgeon: Elouise Munroe, MD;  Location: [place];  Service: Cardiology;  Laterality: N/A;    There were no vitals filed for this visit.  Subjective Assessment - 09/27/19 1041    Subjective  "I saw Dr. Redmond Pulling."    Patient is accompained by:  Family member    Currently in Pain?  No/denies            ADULT SLP TREATMENT - 09/27/19 1042      General Information   Behavior/Cognition  Alert;Cooperative;Pleasant mood    Patient Positioning  Upright in chair    Oral care provided  N/A    HPI  [name] is a 78 y.o. female with history of breast cancer s/p mastectomy  on 05/09/2019 and smoker who was discharged on 05/10/2019, did well for 2 days then became weak and lethargic on 05/13/2019, developing confusion and decreased p.o. intake.  On 05/15/2019, she presented to [facility] ED with CT showing bilateral cerebellar and left occipital lobe hypoattenuation.  MRI showed multiple bilateral posterior circulation cerebellar infarcts in the occipital lobes bilaterally, left thalamus and left tectum.  MRI showed diffuse intracranial arthrosclerosis.  Transferred to [facility] for further evaluation and management.  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  2D echo showed an EF of 60 to 65% without cardiac source of embolus identified and negative bubble.  TEE positive for PFO and bidirectional shunt at rest but no evidence of vegetation or clots.  Lower extremity venous Doppler negative for DVT.  Recommended loop recorder for atrial fibrillation monitoring once complete healing of recent mastectomy wound and plans on following with cardiology outpatient.  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 Pt is now referred by her PCP, Dr. Josph Macho  Wilson, for outpatient services to address residual deficits blurred vision (increased difficulty close up), balance deficits, mild cognitive impairment and dysphagia.      Treatment Provided   Treatment provided  Cognitive-Linquistic      Pain Assessment   Pain Assessment  No/denies pain      Cognitive-Linquistic Treatment   Treatment focused on  Dysarthria;Cognition;Patient/family/caregiver education    Skilled Treatment   SLP provided skilled treatment targeting speech intelligibility in conversation, memory strategies, thought organization with divergent naming task, and assisting  with making a daily routine/schedule.       Assessment / Recommendations / Plan   Plan  Discharge SLP treatment due to (comment)      Progression Toward Goals   Progression toward goals  Goals met, education completed, patient discharged from [place] - 09/27/19 1129      SLP SHORT TERM GOAL #1   Title  Pt will implement memory strategies in functional therapy activities with 90% acc with min cues.    Baseline  25%    Time  4    Period  Weeks    Status  Achieved    Target Date  10/05/19      SLP SHORT TERM GOAL #2   Title  Pt will complete selective and alternating attention tasks (moderately complex) with 90% acc and mi/mod cues.    Baseline  limited by visual deficits; distractable, 75% mod cues    Time  4    Period  Weeks    Status  Achieved    Target Date  10/05/19      SLP SHORT TERM GOAL #3   Title  Pt will complete moderate-level thought organization and planning activities with 90% acc and min assist.    Baseline  mod assist    Time  4    Period  Weeks    Status  Partially Met    Target Date  10/05/19      SLP SHORT TERM GOAL #4   Title  Pt will increase speech intelligibility to 90% intelligible in conversations in 1:1 conversations with SLP with min prompts for strategies and cue for error awareness.    Baseline  ~75% intelligible and mi/mod requests for clarification    Time  4    Period  Weeks    Status  Achieved    Target Date  10/05/19       SLP Long Term Goals - 09/27/19 1129      SLP LONG TERM GOAL #1   Title  Same as short term goals       Plan - 09/27/19 1128    Clinical Impression Statement  Pt presents with ataxic dysarthria resulting in reduced speech intelligibility at the conversation level (~90% intelligible) and mild cognitive deficits in the areas of attention, memory, orientation, and functional problem solving. Her speech intelligibility and functional memory skills have improved greatly. Her greatest  hindrance to increased independence is her visual deficit. She is unable to use written cues to assist with her cognitive deficits. Overall, her family reports increased speech intelligibility at home and is pleased with her progress. She continues to sleep a lot throughout the day and her family has been encouraged to implement a daily routine with scheduled rest breaks and times for activities. Pt will be discharged from SLP services at this time. Pt and family are in agreement with plan of care.    Treatment/Interventions  SLP instruction and feedback;Compensatory strategies;Patient/family education;Compensatory techniques    Potential to Achieve Goals  Good    Potential Considerations  Ability to learn/carryover information    SLP Home Exercise Plan  Pt will be independent with HEP as assigned to facilitate carrover in home environment    Consulted and Agree with Plan of Care  Patient       Patient will benefit from skilled therapeutic intervention in order to improve the following deficits and impairments:   Cognitive communication deficit  Dysarthria and anarthria    Problem List Patient Active Problem List   Diagnosis Date Noted  . Acute ischemic stroke ([place]) 05/15/2019  . Acute metabolic encephalopathy 41/71/2787  . Encephalopathy   . History of left breast cancer 05/09/2019  . Malignant neoplasm of lower-inner quadrant of left breast in female, estrogen receptor positive ([place]) 04/26/2018   SPEECH THERAPY DISCHARGE SUMMARY  Visits from Start of Care: 9  Current functional level related to goals / functional outcomes: See above   Remaining deficits: Mild cognitive impairment; ataxic dysarthria   Education / Equipment: HEP provided  Plan: Patient agrees to discharge.  Patient goals were partially met. Patient is being discharged due to being pleased with the current functional level.  ?????         Thank you,  Genene Churn,  [place]  [facility] 09/27/2019, 11:30 AM  [place], [place], [address] Phone: [phone]   Fax:  [phone]   Name: Jessica Harrell MRN: [id number] Date of Birth: 07/28/1941

## 2019-09-27 NOTE — Therapy (Signed)
[place], [place], [address] Phone: [phone]   Fax:  [phone]  Physical Therapy Treatment  Patient Details  Name: Jessica Harrell MRN: [id number] Date of Birth: 19-May-1941 Referring Provider (PT): Kathryne Eriksson MD   Encounter Date: 09/27/2019  PT End of Session - 09/27/19 1041    Visit Number  12    Number of Visits  24    Date for PT Re-Evaluation  10/25/19   Mini-reassess completed 09/22/19   Authorization Type  [employer] Medicare Visits based on Medical Necessity    Authorization Time Period  08/30/19 - 10/30/19    Authorization - Visit Number  12    Authorization - Number of Visits  20    PT Start Time  0947    PT Stop Time  1028    PT Time Calculation (min)  41 min    Equipment Utilized During Treatment  Gait belt;Other (comment)   Rolling walker   Activity Tolerance  Patient tolerated treatment well;Patient limited by fatigue    Behavior During Therapy  [facility] for tasks assessed/performed       Past Medical History:  Diagnosis Date  . Breast cancer ([place])   . Right foot pain Nov. 2014    Past Surgical History:  Procedure Laterality Date  . BREAST BIOPSY    . BUBBLE STUDY  05/18/2019   Procedure: BUBBLE STUDY;  Surgeon: Elouise Munroe, MD;  Location: [facility] ENDOSCOPY;  Service: Cardiology;;  . CATARACT EXTRACTION     both eyes  . EYE SURGERY Left   . MASTECTOMY W/ SENTINEL NODE BIOPSY Left 05/09/2019   Procedure: LEFT MASTECTOMY WITH LEFT AXILLARY SENTINEL LYMPH NODE BIOPSY;  Surgeon: Coralie Keens, MD;  Location: [place];  Service: General;  Laterality: Left;  . TEE WITHOUT CARDIOVERSION N/A 05/18/2019   Procedure: TRANSESOPHAGEAL ECHOCARDIOGRAM (TEE);  Surgeon: Elouise Munroe, MD;  Location: [place];  Service: Cardiology;  Laterality: N/A;    There were no vitals filed for this visit.  Subjective Assessment - 09/27/19 1033    Subjective  Patients daughter says patient has been more  tired lately. She says they had a Doctors appointment yesterday and are planning to change medications to hopefully reduce fatigue. Patients daughter says patient was a little sore after last visit, but notes that the area on her back is from where she fell over the weekend is no longer sore to touch. Despite this, patient does note some residual discomfort, but does not give a numeric quantitiy.    Patient is accompained by:  Family member   Daughter: [name] and spouse [name])   Pertinent History  CVA 05/14/19    Limitations  House hold activities;Standing;Walking    How long can you stand comfortably?  Requires assistance and support of walker    How long can you walk comfortably?  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  Yes    Pain Location  Back    Pain Orientation  Mid                       [facility] Adult PT Treatment/Exercise - 09/27/19 0001      Knee/Hip Exercises: Standing   Gait Training  30 feet, 10 feet,  Mod A, using RW and wheelchair follow      Knee/Hip Exercises: Seated   Sit to Sand  2 sets;5 reps;with UE support   Mod A         Balance Exercises - 09/27/19 1051      Balance Exercises: Standing   Other Standing Exercises  standing WS, both feet, staggered stance, x 20 each, CG      Balance Exercises: Seated   Static Sitting  No upper extremity support   Therapist perturbation in all directions x20   Dynamic Sitting  Reaching outside base of support   push and pull eccentrics with 1# bar; 5 x each       PT Education - 09/27/19 1040    Education Details  Patient and family members educated on cueing and sequencing of sit to stand transfers, and ambulation using rolling walker.    Person(s) Educated  Patient;Spouse;Child(ren)    Methods   Explanation;Demonstration;Verbal cues;Tactile cues    Comprehension  Verbalized understanding       PT Short Term Goals - 09/22/19 1131      PT SHORT TERM GOAL #1   Title  Patient will be educated on HEP and report regular compliance to improve safety and independence with functional mobility.    Time  4    Period  Weeks    Status  Achieved    Target Date  09/27/19        PT Long Term Goals - 09/22/19 1132      PT LONG TERM GOAL #1   Title  Patient will demonstrate ability to perform sit to stand transfer with modified independence to improve overall functional mobility.    Baseline  09/22/19- Min A, cueing for forward momentum    Time  8    Period  Weeks    Status  On-going      PT LONG TERM GOAL #2   Title  Patient will demonstrate ability to ambulate 100 feet with LRAD with no more than minimal assistance and minimal tactile and verbal cues for form.    Baseline  1-/16/20- able to ambulate 50' and 73' with rest break, using RW on level surface with Min A and frequent cueing for BLE sequencing, and keeping BOS within RW.    Time  8    Period  Weeks    Status  On-going      PT LONG TERM GOAL #3   Title  Patient will demonstrate improvement on the BBS of at least 6 points indicating improved balance and independence with functional mobility.    Time  8    Period  Weeks    Status  Deferred      PT LONG TERM GOAL #4   Title  Patient will demonstrate ability to correct standing posture and maintain standing balance for at least 1 minute with no more than minimal verbal cueing to improve independence with functional mobility.    Baseline  Able to do, but needs frequent cueing to avoid left leaning    Time  8    Period  Weeks    Status  On-going            Plan - 09/27/19 1042    Clinical Impression Statement  Patient had increased difficulty with coordinating movements and sequencing of transfers. Patient required Mod verbal cueing for keeping nose over toes to  ascend, and bringing hips forward to stand fully erect. Patient showed increased tendency to lean backwards  in doing this today, and required Mod A for sit to stand transfers. Patient also had increased difficulty with gait training, due to difficulty sequencing BLEs, leaning left and leaning backwards. Patient had LOB x 2 with gait training but was corrected with therapist Mod A. Patient did report increased fatigue this visit, which is likely a contributing factor to today's performance.    Personal Factors and Comorbidities  Age;Comorbidity 1    Comorbidities  Hx Breast Cancer, S/P CVA    Examination-Activity Limitations  Bathing;Hygiene/Grooming;Squat;Stairs;Bend;Lift;Locomotion Level;Stand;Transfers;Dressing    Examination-Participation Restrictions  Cleaning;Shop;Laundry;Community Activity;Meal Prep    Stability/Clinical Decision Making  Evolving/Moderate complexity    Rehab Potential  Good    PT Frequency  3x / week    PT Duration  8 weeks    PT Treatment/Interventions  ADLs/Self Care Home Management;Aquatic Therapy;Cryotherapy;Electrical Stimulation;Moist Heat;DME Instruction;Gait training;Stair training;Functional mobility training;Therapeutic activities;Therapeutic exercise;Balance training;Neuromuscular re-education;Patient/family education;Manual techniques;Passive range of motion;Dry needling;Energy conservation;Taping    PT Next Visit Plan  Continue to progress standing balance and gait training as able.    PT Home Exercise Plan  9/28: seated stepping towards sticky note targets for coordination; 10/12: gait training 3x/day before/after sleep depending on fatigue, heel raises and mini squats at countertop for UE support with family at side; 10/19: lateral scooting while seated EOB from HOB<>foot of bed    Consulted and Agree with Plan of Care  Patient;Family member/caregiver    Family Member Consulted  spouse [name]), daughter ([place])       Patient will benefit from skilled  therapeutic intervention in order to improve the following deficits and impairments:  Abnormal gait, Impaired sensation, Improper body mechanics, Decreased coordination, Decreased mobility, Postural dysfunction, Decreased activity tolerance, Decreased endurance, Decreased strength, Decreased balance, Decreased safety awareness, Difficulty walking, Impaired vision/preception  Visit Diagnosis: Other symptoms and signs involving the nervous system  Other lack of coordination  Other abnormalities of gait and mobility     Problem List Patient Active Problem List   Diagnosis Date Noted  . Acute ischemic stroke ([place]) 05/15/2019  . Acute metabolic encephalopathy [id number]  . Encephalopathy   . History of left breast cancer 05/09/2019  . Malignant neoplasm of lower-inner quadrant of left breast in female, estrogen receptor positive ([place]) 04/26/2018    Elizbeth Squires PT DPT 09/27/2019, 10:57 AM  [place], [place], [address] Phone: [phone]   Fax:  [phone]  Name: Jessica Harrell MRN: [id number] Date of Birth: [birth year]

## 2019-09-27 NOTE — Therapy (Signed)
[place] [address] [place], [place], [address] Phone: [phone]   Fax:  [phone]  Occupational Therapy Treatment  Patient Details  Name: Jessica Harrell MRN: [id number] Date of Birth: [birth year] Referring Provider (OT): Dr. Kathryne Eriksson   Encounter Date: 09/27/2019  OT End of Session - 09/27/19 1221    Visit Number  6    Number of Visits  8    Date for OT Re-Evaluation  09/29/19    Authorization Type  [employer] Medicare    Authorization Time Period  no visit limit    OT Start Time  1119   Pt's Speech therapy extended into OT session   OT Stop Time  1153    OT Time Calculation (min)  34 min    Activity Tolerance  Patient tolerated treatment well    Behavior During Therapy  [facility] for tasks assessed/performed       Past Medical History:  Diagnosis Date  . Breast cancer ([place])   . Right foot pain Nov. 2014    Past Surgical History:  Procedure Laterality Date  . BREAST BIOPSY    . BUBBLE STUDY  05/18/2019   Procedure: BUBBLE STUDY;  Surgeon: Elouise Munroe, MD;  Location: [facility] ENDOSCOPY;  Service: Cardiology;;  . CATARACT EXTRACTION     both eyes  . EYE SURGERY Left   . MASTECTOMY W/ SENTINEL NODE BIOPSY Left 05/09/2019   Procedure: LEFT MASTECTOMY WITH LEFT AXILLARY SENTINEL LYMPH NODE BIOPSY;  Surgeon: Coralie Keens, MD;  Location: [place];  Service: General;  Laterality: Left;  . TEE WITHOUT CARDIOVERSION N/A 05/18/2019   Procedure: TRANSESOPHAGEAL ECHOCARDIOGRAM (TEE);  Surgeon: Elouise Munroe, MD;  Location: [place];  Service: Cardiology;  Laterality: N/A;    There were no vitals filed for this visit.  Subjective Assessment - 09/27/19 1136    Subjective   S: Nothing new to report.    Patient is accompanied by:  Family member   Husband and Daughter   Currently in Pain?  No/denies         [facility] OT Assessment - 09/27/19 1220      Assessment   Medical Diagnosis  s/p CVA      Precautions    Precautions  Fall;Other (comment)    Precaution Comments  Visual deficit- cortical blindness. Use contrast for objects during session.                OT Treatments/Exercises (OP) - 09/27/19 1125      Exercises   Exercises  Theraputty;Hand;Shoulder      Shoulder Exercises: ROM/Strengthening   Proximal Shoulder Strengthening, Seated  Holding green weighted ball with arm extended at 90 degrees complete proximal strengthening (vertical, horizontal, and circles) 10X. Completed with BUE.       Hand Exercises   Hand Gripper with Large Beads  Right: 6/6 at 15# left: 6/6 beads at 20#    Hand Gripper with Medium Beads  Right: 2/3 at 15# left: 2/3 at 20#    Sponges  Left: 2# wrist weight, patient transferred 10 sponges from tabletop to elevated platform. [place]th: 1# wrist weight; transferred 10 sponges from tabletop to elevated surface               OT Short Term Goals - 09/01/19 1600      OT SHORT TERM GOAL #1   Title  Pt will be provided with and educated on HEP to improve safety and independence in ADL completion.  Time  4    Period  Weeks    Status  On-going    Target Date  09/30/19      OT SHORT TERM GOAL #2   Title  Pt will be educated on appropriate AE to improve independence in eating, handwriting, and self-care tasks to compensate for visual deficits and ataxia.    Time  4    Period  Weeks    Status  On-going      OT SHORT TERM GOAL #3   Title  Pt will increase bilateral grip strength by 10# and pinch strength by 2# to improve ability to grasp and maintain hold on objects during functional tasks.    Time  4    Period  Weeks    Status  On-going      OT SHORT TERM GOAL #4   Title  Pt will improve handwriting legibility by 25%, using compensatory strategies as needed, to improve independence in signing checks.    Time  4    Period  Weeks    Status  On-going      OT SHORT TERM GOAL #5   Title  Pt will demonstrate improvement in fine motor coordination by  achieving at least 20 blocks with bilateral hands during box and blocks test.    Time  4    Period  Weeks    Status  On-going               Plan - 09/27/19 1221    Clinical Impression Statement  A: Session focused on proximal shoulder strengthening and hand strength. Pt demonstrates poor carry over of cueing during handgripper task to allow therapist to guide her hand over bead in order to grab. Pt fatigued quickly during task due to multiple gripping with handgripper while not demonstrating understanding of cueing to complete task with therapist.    Body Structure / Function / Physical Skills  ADL;Endurance;UE functional use;Vision;Body mechanics;Proprioception;Coordination;IADL;Strength    Plan  P: Reassessment. Pt ready for discharge?    Consulted and Agree with Plan of Care  Patient;Family member/caregiver    Family Member Consulted  daughter and spouse       Patient will benefit from skilled therapeutic intervention in order to improve the following deficits and impairments:   Body Structure / Function / Physical Skills: ADL, Endurance, UE functional use, Vision, Body mechanics, Proprioception, Coordination, IADL, Strength       Visit Diagnosis: Other lack of coordination  Other symptoms and signs involving the nervous system    Problem List Patient Active Problem List   Diagnosis Date Noted  . Acute ischemic stroke ([place]) 05/15/2019  . Acute metabolic encephalopathy [id number]  . Encephalopathy   . History of left breast cancer 05/09/2019  . Malignant neoplasm of lower-inner quadrant of left breast in female, estrogen receptor positive [facility]) 04/26/2018   Ailene Ravel, OTR/L,CBIS  [phone]  09/27/2019, 12:25 PM  [place], [place], [address] Phone: [phone]   Fax:  [phone]  Name: Jessica Harrell MRN: [id number] Date of Birth: 1941/10/06

## 2019-09-28 NOTE — Progress Notes (Signed)
Carelink Summary Report / Loop Recorder 

## 2019-09-29 ENCOUNTER — Other Ambulatory Visit: Payer: Self-pay

## 2019-09-29 ENCOUNTER — Ambulatory Visit (HOSPITAL_COMMUNITY): Payer: Medicare Other | Admitting: Physical Therapy

## 2019-09-29 ENCOUNTER — Encounter (HOSPITAL_COMMUNITY): Payer: Self-pay | Admitting: Physical Therapy

## 2019-09-29 ENCOUNTER — Ambulatory Visit (HOSPITAL_COMMUNITY): Payer: Medicare Other

## 2019-09-29 DIAGNOSIS — R2689 Other abnormalities of gait and mobility: Secondary | ICD-10-CM

## 2019-09-29 DIAGNOSIS — R29818 Other symptoms and signs involving the nervous system: Secondary | ICD-10-CM

## 2019-09-29 DIAGNOSIS — R278 Other lack of coordination: Secondary | ICD-10-CM

## 2019-09-29 NOTE — Therapy (Signed)
[place] [address] [place], [place], [address] Phone: [phone]   Fax:  [phone]  Occupational Therapy Treatment reassessment Patient Details  Name: Jessica Harrell MRN: [id number] Date of Birth: May 04, 1941 Referring Provider (OT): Dr. Kathryne Eriksson      Encounter Date: 09/29/2019  OT End of Session - 09/29/19 1454    Visit Number  7    Number of Visits  8    Authorization Type  [employer] Medicare    Authorization Time Period  no visit limit    OT Start Time  6553   reassess and discharge   OT Stop Time  1105    OT Time Calculation (min)  32 min    Activity Tolerance  Patient tolerated treatment well    Behavior During Therapy  [facility] for tasks assessed/performed       Past Medical History:  Diagnosis Date  . Breast cancer ([place])   . Right foot pain Nov. 2014    Past Surgical History:  Procedure Laterality Date  . BREAST BIOPSY    . BUBBLE STUDY  05/18/2019   Procedure: BUBBLE STUDY;  Surgeon: Elouise Munroe, MD;  Location: [facility] ENDOSCOPY;  Service: Cardiology;;  . CATARACT EXTRACTION     both eyes  . EYE SURGERY Left   . MASTECTOMY W/ SENTINEL NODE BIOPSY Left 05/09/2019   Procedure: LEFT MASTECTOMY WITH LEFT AXILLARY SENTINEL LYMPH NODE BIOPSY;  Surgeon: Coralie Keens, MD;  Location: [place];  Service: General;  Laterality: Left;  . TEE WITHOUT CARDIOVERSION N/A 05/18/2019   Procedure: TRANSESOPHAGEAL ECHOCARDIOGRAM (TEE);  Surgeon: Elouise Munroe, MD;  Location: [place];  Service: Cardiology;  Laterality: N/A;    There were no vitals filed for this visit.  Subjective Assessment - 09/29/19 1450    Subjective   S: Daughter reports that she recently purchased a color coded shape sorting bird house from [place] to work on coordination.    Patient is accompanied by:  Family member   daughter and husband   Currently in Pain?  No/denies         [facility] OT Assessment - 09/29/19 1045      Assessment    Medical Diagnosis  s/p CVA      Precautions   Precautions  Fall;Other (comment)    Precaution Comments  Visual deficit- cortical blindness. Use contrast for objects during session.       Prior Function   Level of Independence  Independent      Coordination   Box and Blocks  Left: 13 Right 15   previous: Left: 14 Right: 16     Strength   Right/Left hand  Left;Right    Right Hand Grip (lbs)  45   previous: same   Right Hand Lateral Pinch  10 lbs   previous: 9   Right Hand 3 Point Pinch  10 lbs   previous: 7   Left Hand Grip (lbs)  35   previous: same   Left Hand Lateral Pinch  9 lbs   previous: same   Left Hand 3 Point Pinch  9 lbs   previous: 7                      OT Education - 09/29/19 1452    Education Details  reviewed goals and progress in therapy. Discussed HEP and compensatory techniques being utilized at home for visual deficits. Discussed contrast and ways to increase safety while walking and  ambulating within the home and community. Provided red theraputty to progress to when able for hand strengthening HEP.    Person(s) Educated  Patient;Spouse;Child(ren)    Methods  Explanation    Comprehension  Verbalized understanding       OT Short Term Goals - 09/29/19 1501      OT SHORT TERM GOAL #1   Title  Pt will be provided with and educated on HEP to improve safety and independence in ADL completion.    Time  4    Period  Weeks    Status  Achieved    Target Date  09/30/19      OT SHORT TERM GOAL #2   Title  Pt will be educated on appropriate AE to improve independence in eating, handwriting, and self-care tasks to compensate for visual deficits and ataxia.    Time  4    Period  Weeks    Status  Achieved      OT SHORT TERM GOAL #3   Title  Pt will increase bilateral grip strength by 10# and pinch strength by 2# to improve ability to grasp and maintain hold on objects during functional tasks.    Time  4    Period  Weeks    Status  Not Met       OT SHORT TERM GOAL #4   Title  Pt will improve handwriting legibility by 25%, using compensatory strategies as needed, to improve independence in signing checks.    Time  4    Period  Weeks    Status  Not Met      OT SHORT TERM GOAL #5   Title  Pt will demonstrate improvement in fine motor coordination by achieving at least 20 blocks with bilateral hands during box and blocks test.    Time  4    Period  Weeks    Status  Not Met               Plan - 09/29/19 1458    Clinical Impression Statement  A: Reassessment completed this date. patient has met 2/5 therapy goals. She has remained the same or relatively close to her previous strength measurements for hand strength. Family has be educated on appropriate compensatory techniques and adaptive equipment to assist with visual deficits. They are independent with completion of HEP. Due to visual deficits, patient has met the highest level of progression in therapy and is ready for discharge with family to assist at home.    Body Structure / Function / Physical Skills  ADL;Endurance;UE functional use;Vision;Body mechanics;Proprioception;Coordination;IADL;Strength    Plan  P: Discharge from OT services with HEP. Pt to follow up as needed.    Consulted and Agree with Plan of Care  Patient;Family member/caregiver    Family Member Consulted  daughter and spouse       Patient will benefit from skilled therapeutic intervention in order to improve the following deficits and impairments:   Body Structure / Function / Physical Skills: ADL, Endurance, UE functional use, Vision, Body mechanics, Proprioception, Coordination, IADL, Strength       Visit Diagnosis: Other lack of coordination  Other symptoms and signs involving the nervous system    Problem List Patient Active Problem List   Diagnosis Date Noted  . Acute ischemic stroke ([place]) 05/15/2019  . Acute metabolic encephalopathy 72/62/0355  . Encephalopathy   . History of  left breast cancer 05/09/2019  . Malignant neoplasm of lower-inner quadrant of left breast in female,  estrogen receptor positive ([place]) 04/26/2018    OCCUPATIONAL THERAPY DISCHARGE SUMMARY  Visits from Start of Care: 7  Current functional level related to goals / functional outcomes: See above   Remaining deficits: Cortical blindness. Decreased fine   Education / Equipment: See above Plan: Patient agrees to discharge.  Patient goals were partially met.                                                                                                      ?????        Ailene Ravel, OTR/L,CBIS  [phone]  09/29/2019, 3:06 PM  [place], [place], [address] Phone: [phone]   Fax:  [phone]  Name: Jessica Harrell MRN: [id number] Date of Birth: [birth year]

## 2019-09-29 NOTE — Therapy (Signed)
[place], [place], [address] Phone: [phone]   Fax:  [phone]  Physical Therapy Treatment  Patient Details  Name: Jessica Harrell MRN: [id number] Date of Birth: 1941/08/09 Referring Provider (PT): Kathryne Eriksson MD   Encounter Date: 09/29/2019  PT End of Session - 09/29/19 1204    Visit Number  13    Number of Visits  24    Date for PT Re-Evaluation  10/25/19   Mini-reassess completed 09/22/19   Authorization Type  [employer] Medicare Visits based on Medical Necessity    Authorization Time Period  08/30/19 - 10/30/19    Authorization - Visit Number  13    Authorization - Number of Visits  20    PT Start Time  1115    PT Stop Time  1200    PT Time Calculation (min)  45 min    Equipment Utilized During Treatment  Gait belt;Other (comment)   Rolling walker   Activity Tolerance  Patient tolerated treatment well;Patient limited by fatigue    Behavior During Therapy  [facility] for tasks assessed/performed       Past Medical History:  Diagnosis Date  . Breast cancer ([place])   . Right foot pain Nov. 2014    Past Surgical History:  Procedure Laterality Date  . BREAST BIOPSY    . BUBBLE STUDY  05/18/2019   Procedure: BUBBLE STUDY;  Surgeon: Elouise Munroe, MD;  Location: [facility] ENDOSCOPY;  Service: Cardiology;;  . CATARACT EXTRACTION     both eyes  . EYE SURGERY Left   . MASTECTOMY W/ SENTINEL NODE BIOPSY Left 05/09/2019   Procedure: LEFT MASTECTOMY WITH LEFT AXILLARY SENTINEL LYMPH NODE BIOPSY;  Surgeon: Coralie Keens, MD;  Location: [place];  Service: General;  Laterality: Left;  . TEE WITHOUT CARDIOVERSION N/A 05/18/2019   Procedure: TRANSESOPHAGEAL ECHOCARDIOGRAM (TEE);  Surgeon: Elouise Munroe, MD;  Location: [place];  Service: Cardiology;  Laterality: N/A;    There were no vitals filed for this visit.  Subjective Assessment - 09/29/19 1201    Subjective  Patient states she fell out of bed this  morning while waiting to get dressed. Patients husband reports "I left for 10 minutes and when I came back she was on the floor". Patient denies any sustained injury and that she was able to get up with help of her husband. Patient does note ongoing pain in mid/low back area, but that this is not new pain. Patient did not give numeric quantitiy on pain scale, just states that "my back is sore".    Patient is accompained by:  Family member   Daughter: [name] and spouse [name])   Pertinent History  CVA 05/14/19    Limitations  House hold activities;Standing;Walking    How long can you stand comfortably?  Requires assistance and support of walker    How long can you walk comfortably?  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  Yes    Pain Location  Back    Pain Orientation  Lower    Pain Descriptors / Indicators  Sore                       [facility] Adult PT Treatment/Exercise - 09/29/19 0001  Knee/Hip Exercises: Aerobic   Recumbent Bike  3 minutes level 2, 2 minutes level 0      Knee/Hip Exercises: Standing   Gait Training  FWD amb with pivot turn at //; UE assits x2; 3x RT with rest breaks    Other Standing Knee Exercises  Sidestepping at // with UE assist x2; 2 x RT      Knee/Hip Exercises: Seated   Sit to Sand  5 reps   From chair; Min A; performed throughout session         Balance Exercises - 09/29/19 1211      Balance Exercises: Standing   Standing Eyes Opened  Wide (BOA);Solid surface;3 reps;30 secs   at //   Other Standing Exercises  standing WS, both feet, staggered stance, x 20 each, CG   at //         PT Short Term Goals - 09/22/19 1131      PT SHORT TERM GOAL #1   Title  Patient will be educated on HEP and report regular compliance to  improve safety and independence with functional mobility.    Time  4    Period  Weeks    Status  Achieved    Target Date  09/27/19        PT Long Term Goals - 09/22/19 1132      PT LONG TERM GOAL #1   Title  Patient will demonstrate ability to perform sit to stand transfer with modified independence to improve overall functional mobility.    Baseline  09/22/19- Min A, cueing for forward momentum    Time  8    Period  Weeks    Status  On-going      PT LONG TERM GOAL #2   Title  Patient will demonstrate ability to ambulate 100 feet with LRAD with no more than minimal assistance and minimal tactile and verbal cues for form.    Baseline  1-/16/20- able to ambulate 50' and 40' with rest break, using RW on level surface with Min A and frequent cueing for BLE sequencing, and keeping BOS within RW.    Time  8    Period  Weeks    Status  On-going      PT LONG TERM GOAL #3   Title  Patient will demonstrate improvement on the BBS of at least 6 points indicating improved balance and independence with functional mobility.    Time  8    Period  Weeks    Status  Deferred      PT LONG TERM GOAL #4   Title  Patient will demonstrate ability to correct standing posture and maintain standing balance for at least 1 minute with no more than minimal verbal cueing to improve independence with functional mobility.    Baseline  Able to do, but needs frequent cueing to avoid left leaning    Time  8    Period  Weeks    Status  On-going            Plan - 09/29/19 1204    Clinical Impression Statement  Patient demos improved balance with activity today. Added recumbant bike warm up for improved BLE mobility and sequencing. Added gait trainign at parallell bars. Patient seemed to do better with maintaining balance in bars during gait training, and was able to use UE assist to self correct when weight shifting left and backward. Patient does continue to show increased fatigue with weightbearing  exercise, and required  3 rest breaks. Patient continues to require frequent verbal cues for avoiding narrow stance when gait training, and making turns, but did show some improved carry over of sequencing sit to stand transfers. Patient does not presents with any visable or noted injury sustained from reported fall, and showed no reduction of functional abilty compared to previous session.    Personal Factors and Comorbidities  Age;Comorbidity 1    Comorbidities  Hx Breast Cancer, S/P CVA    Examination-Activity Limitations  Bathing;Hygiene/Grooming;Squat;Stairs;Bend;Lift;Locomotion Level;Stand;Transfers;Dressing    Examination-Participation Restrictions  Cleaning;Shop;Laundry;Community Activity;Meal Prep    Stability/Clinical Decision Making  Evolving/Moderate complexity    Rehab Potential  Good    PT Frequency  3x / week    PT Duration  8 weeks    PT Treatment/Interventions  ADLs/Self Care Home Management;Aquatic Therapy;Cryotherapy;Electrical Stimulation;Moist Heat;DME Instruction;Gait training;Stair training;Functional mobility training;Therapeutic activities;Therapeutic exercise;Balance training;Neuromuscular re-education;Patient/family education;Manual techniques;Passive range of motion;Dry needling;Energy conservation;Taping    PT Next Visit Plan  Continue to progress gait training/ endurance in parallell bars as able    PT Home Exercise Plan  9/28: seated stepping towards sticky note targets for coordination; 10/12: gait training 3x/day before/after sleep depending on fatigue, heel raises and mini squats at countertop for UE support with family at side; 10/19: lateral scooting while seated EOB from HOB<>foot of bed    Consulted and Agree with Plan of Care  Patient;Family member/caregiver    Family Member Consulted  spouse [name]), daughter ([place])       Patient will benefit from skilled therapeutic intervention in order to improve the following deficits and impairments:  Abnormal gait,  Impaired sensation, Improper body mechanics, Decreased coordination, Decreased mobility, Postural dysfunction, Decreased activity tolerance, Decreased endurance, Decreased strength, Decreased balance, Decreased safety awareness, Difficulty walking, Impaired vision/preception  Visit Diagnosis: Other symptoms and signs involving the nervous system  Other lack of coordination  Other abnormalities of gait and mobility     Problem List Patient Active Problem List   Diagnosis Date Noted  . Acute ischemic stroke ([place]) 05/15/2019  . Acute metabolic encephalopathy [id number]  . Encephalopathy   . History of left breast cancer 05/09/2019  . Malignant neoplasm of lower-inner quadrant of left breast in female, estrogen receptor positive ([place]) 04/26/2018    Elizbeth Squires DPT 09/29/2019, 12:18 PM  [facility] [address] [place], [place], [address] Phone: [phone]   Fax:  [phone]  Name: Nixie Jokinen MRN: [id number] Date of Birth: August 23, 1941

## 2019-10-02 ENCOUNTER — Encounter (HOSPITAL_COMMUNITY): Payer: Medicare Other

## 2019-10-02 ENCOUNTER — Ambulatory Visit (HOSPITAL_COMMUNITY): Payer: Medicare Other

## 2019-10-02 ENCOUNTER — Telehealth (HOSPITAL_COMMUNITY): Payer: Self-pay

## 2019-10-02 NOTE — Telephone Encounter (Signed)
s/w the pt's dtr and she is aware that we had to cancel todays appt due to the therapist is out today with a sick child

## 2019-10-04 ENCOUNTER — Ambulatory Visit (HOSPITAL_COMMUNITY): Payer: Medicare Other | Admitting: Physical Therapy

## 2019-10-04 ENCOUNTER — Other Ambulatory Visit: Payer: Self-pay

## 2019-10-04 ENCOUNTER — Encounter (HOSPITAL_COMMUNITY): Payer: Medicare Other | Admitting: Occupational Therapy

## 2019-10-04 ENCOUNTER — Encounter (HOSPITAL_COMMUNITY): Payer: Self-pay | Admitting: Physical Therapy

## 2019-10-04 DIAGNOSIS — R29818 Other symptoms and signs involving the nervous system: Secondary | ICD-10-CM | POA: Diagnosis not present

## 2019-10-04 DIAGNOSIS — R2689 Other abnormalities of gait and mobility: Secondary | ICD-10-CM

## 2019-10-04 DIAGNOSIS — R278 Other lack of coordination: Secondary | ICD-10-CM

## 2019-10-04 NOTE — Therapy (Signed)
[place], [place], [address] Phone: [phone]   Fax:  [phone]  Physical Therapy Treatment  Patient Details  Name: Jessica Harrell MRN: [id number] Date of Birth: 23-May-1941 Referring Provider (PT): Kathryne Eriksson MD   Encounter Date: 10/04/2019  PT End of Session - 10/04/19 1247    Visit Number  14    Number of Visits  24    Date for PT Re-Evaluation  10/25/19   Mini-reassess completed 09/22/19   Authorization Type  [employer] Medicare Visits based on Medical Necessity    Authorization Time Period  08/30/19 - 10/30/19    Authorization - Visit Number  14    Authorization - Number of Visits  20    PT Start Time  1120    PT Stop Time  1200    PT Time Calculation (min)  40 min    Equipment Utilized During Treatment  Gait belt;Other (comment)   Rolling walker   Activity Tolerance  Patient tolerated treatment well;Patient limited by fatigue    Behavior During Therapy  [facility] for tasks assessed/performed       Past Medical History:  Diagnosis Date  . Breast cancer ([place])   . Right foot pain Nov. 2014    Past Surgical History:  Procedure Laterality Date  . BREAST BIOPSY    . BUBBLE STUDY  05/18/2019   Procedure: BUBBLE STUDY;  Surgeon: Elouise Munroe, MD;  Location: [facility] ENDOSCOPY;  Service: Cardiology;;  . CATARACT EXTRACTION     both eyes  . EYE SURGERY Left   . MASTECTOMY W/ SENTINEL NODE BIOPSY Left 05/09/2019   Procedure: LEFT MASTECTOMY WITH LEFT AXILLARY SENTINEL LYMPH NODE BIOPSY;  Surgeon: Coralie Keens, MD;  Location: [place];  Service: General;  Laterality: Left;  . TEE WITHOUT CARDIOVERSION N/A 05/18/2019   Procedure: TRANSESOPHAGEAL ECHOCARDIOGRAM (TEE);  Surgeon: Elouise Munroe, MD;  Location: [place];  Service: Cardiology;  Laterality: N/A;    There were no vitals filed for this visit.  Subjective Assessment - 10/04/19 1242    Subjective  Patient denied any pain. Patient's daughter  stated that she's reported some back pain, but that it has improved.    Patient is accompained by:  Family member   Daughter: [name] and spouse [name])   Pertinent History  CVA 05/14/19    Limitations  House hold activities;Standing;Walking    How long can you stand comfortably?  Requires assistance and support of walker    How long can you walk comfortably?  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  No/denies                       [facility] Adult PT Treatment/Exercise - 10/04/19 0001      Knee/Hip Exercises: Aerobic   Recumbent Bike  5 minutes  Level 0. 3 minutes with 30'' break and then 2 minutes      Knee/Hip Exercises: Standing   Gait Training  Forward ambulation to get to // bars from bike with verbal cues for sequencing and navigation and moderate assistance for safety.     Other Standing Knee Exercises  Sidestepping at // with UE assist x2 and x1 RT      Knee/Hip Exercises: Seated  Sit to Sand  5 reps   Minimal assistance primarily, Max A 1x to recover balance            PT Education - 10/04/19 1243    Education Details  Discussed safety and monitoring patient with movement.    Person(s) Educated  Spouse;Child(ren)    Methods  Explanation    Comprehension  Verbalized understanding       PT Short Term Goals - 09/22/19 1131      PT SHORT TERM GOAL #1   Title  Patient will be educated on HEP and report regular compliance to improve safety and independence with functional mobility.    Time  4    Period  Weeks    Status  Achieved    Target Date  09/27/19        PT Long Term Goals - 09/22/19 1132      PT LONG TERM GOAL #1   Title  Patient will demonstrate ability to perform sit to stand transfer with modified independence to improve overall  functional mobility.    Baseline  09/22/19- Min A, cueing for forward momentum    Time  8    Period  Weeks    Status  On-going      PT LONG TERM GOAL #2   Title  Patient will demonstrate ability to ambulate 100 feet with LRAD with no more than minimal assistance and minimal tactile and verbal cues for form.    Baseline  1-/16/20- able to ambulate 50' and 2' with rest break, using RW on level surface with Min A and frequent cueing for BLE sequencing, and keeping BOS within RW.    Time  8    Period  Weeks    Status  On-going      PT LONG TERM GOAL #3   Title  Patient will demonstrate improvement on the BBS of at least 6 points indicating improved balance and independence with functional mobility.    Time  8    Period  Weeks    Status  Deferred      PT LONG TERM GOAL #4   Title  Patient will demonstrate ability to correct standing posture and maintain standing balance for at least 1 minute with no more than minimal verbal cueing to improve independence with functional mobility.    Baseline  Able to do, but needs frequent cueing to avoid left leaning    Time  8    Period  Weeks    Status  On-going            Plan - 10/04/19 1256    Clinical Impression Statement  This session continued with aerobic exercise on the bicycle to prime the system for movement to promote neuroplasticity, improve reciprocal movement, improve sequencing, and improve endurance. Patient required more assistance this session with sit to stands to maintain balance with 1 instance of near loss of balance to the right side which required maximal assistance to recover. Patient continued to demonstrate improved sequencing with side stepping inside the parallel bars. Patient would benefit from continued skilled physical therapy in order to continue progressing towards functional goals.    Personal Factors and Comorbidities  Age;Comorbidity 1    Comorbidities  Hx Breast Cancer, S/P CVA    Examination-Activity  Limitations  Bathing;Hygiene/Grooming;Squat;Stairs;Bend;Lift;Locomotion Level;Stand;Transfers;Dressing    Examination-Participation Restrictions  Cleaning;Shop;Laundry;Community Activity;Meal Prep    Stability/Clinical Decision Making  Evolving/Moderate complexity    Rehab Potential  Good    PT Frequency  3x / week    PT Duration  8 weeks    PT Treatment/Interventions  ADLs/Self Care Home Management;Aquatic Therapy;Cryotherapy;Electrical Stimulation;Moist Heat;DME Instruction;Gait training;Stair training;Functional mobility training;Therapeutic activities;Therapeutic exercise;Balance training;Neuromuscular re-education;Patient/family education;Manual techniques;Passive range of motion;Dry needling;Energy conservation;Taping    PT Next Visit Plan  Ambulation in a closed environment (decreased stimulation to improve focus), consider trialing a weighted gait belt to improve trunk proprioception    PT Home Exercise Plan  9/28: seated stepping towards sticky note targets for coordination; 10/12: gait training 3x/day before/after sleep depending on fatigue, heel raises and mini squats at countertop for UE support with family at side; 10/19: lateral scooting while seated EOB from HOB<>foot of bed    Consulted and Agree with Plan of Care  Patient;Family member/caregiver    Family Member Consulted  spouse [name]), daughter ([place])       Patient will benefit from skilled therapeutic intervention in order to improve the following deficits and impairments:  Abnormal gait, Impaired sensation, Improper body mechanics, Decreased coordination, Decreased mobility, Postural dysfunction, Decreased activity tolerance, Decreased endurance, Decreased strength, Decreased balance, Decreased safety awareness, Difficulty walking, Impaired vision/preception  Visit Diagnosis: Other symptoms and signs involving the nervous system  Other lack of coordination  Other abnormalities of gait and mobility     Problem  List Patient Active Problem List   Diagnosis Date Noted  . Acute ischemic stroke ([place]) 05/15/2019  . Acute metabolic encephalopathy [id number]  . Encephalopathy   . History of left breast cancer 05/09/2019  . Malignant neoplasm of lower-inner quadrant of left breast in female, estrogen receptor positive ([place]) 04/26/2018   Clarene Critchley PT, DPT 12:59 PM, 10/04/19 [place], [place], [address] Phone: [phone]   Fax:  [phone]  Name: Jessica Harrell MRN: [id number] Date of Birth: 01/08/1941

## 2019-10-05 ENCOUNTER — Ambulatory Visit: Payer: Medicare Other | Admitting: Adult Health

## 2019-10-05 ENCOUNTER — Encounter: Payer: Self-pay | Admitting: Adult Health

## 2019-10-05 VITALS — BP 111/64 | HR 69 | Temp 97.6°F | Ht 67.0 in | Wt 120.2 lb

## 2019-10-05 DIAGNOSIS — I639 Cerebral infarction, unspecified: Secondary | ICD-10-CM | POA: Diagnosis not present

## 2019-10-05 DIAGNOSIS — I10 Essential (primary) hypertension: Secondary | ICD-10-CM

## 2019-10-05 DIAGNOSIS — E785 Hyperlipidemia, unspecified: Secondary | ICD-10-CM | POA: Diagnosis not present

## 2019-10-05 DIAGNOSIS — R471 Dysarthria and anarthria: Secondary | ICD-10-CM

## 2019-10-05 DIAGNOSIS — I69393 Ataxia following cerebral infarction: Secondary | ICD-10-CM

## 2019-10-05 DIAGNOSIS — H538 Other visual disturbances: Secondary | ICD-10-CM | POA: Diagnosis not present

## 2019-10-05 NOTE — Progress Notes (Signed)
[facility] [address] [place]. [place] [address] [phone]       OFFICE FOLLOW UP NOTE  [name] Date of Birth:  04-Nov-1941 Medical Record Number:  [id number]   Reason for Referral: Stroke follow-up    CHIEF COMPLAINT:  Chief Complaint  Patient presents with   Follow-up    3 mon f/u. Daughter present. Treatment room. No new concerns at this time.     HPI: Stroke admission 05/10/2019: [name] is a 78 y.o. female with history of breast cancer s/p mastectomy on 05/09/2019 and smoker who was discharged on 05/10/2019, did well for 2 days then became weak and lethargic on 05/13/2019, developing confusion and decreased p.o. intake.  Per review of note, husband initially believed symptoms are secondary to recent surgery and residual effects of anesthesia.  Symptoms worsened and EMS called on 05/15/2019.  She presented to [facility] ED with CT showing bilateral cerebellar and left occipital lobe hypoattenuation.  MRI showed multiple bilateral posterior circulation cerebellar infarcts in the occipital lobes bilaterally, left thalamus and left tectum.  MRI showed diffuse intracranial arthrosclerosis.  Transferred to [facility] for further evaluation and management.  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  2D echo showed an EF of 60 to 65% without cardiac source of embolus identified and negative bubble.  TEE positive for PFO and bidirectional shunt at rest but no evidence of vegetation or clots.  Lower extremity venous Doppler negative for DVT.  Recommended loop recorder for atrial fibrillation monitoring once complete healing of recent mastectomy wound and plans on following with cardiology outpatient.  LDL 139 and A1c 5.6.  Initiated aspirin and rosuvastatin for secondary stroke prevention.  Other stroke risk factors include advanced age, tobacco use and EtOH use.   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  Virtual visit 06/26/2019: Residual deficits blurred vision (increased difficulty close up), balance deficits, mild cognitive impairment and dysphagia.  All residual deficits have been improving and she continues to participate in therapies. Did have FEES and recommend NTL but speech therapy felt it was not safe due to continued choking therefore went back to HTL. Ambulation has improved with mild leaning towards right with use of RW with therapy but uses WC while not with therapies for transportation.  Mild processing delay but overall improved. She plans on leaving facility today to return home with husband and daughter with recommendations of home health therapies Continues on aspirin 81 mg without bleeding or bruising Continues on Crestor 10 mg without myalgias Blood pressure 110/58 Cardiology appointment 07/11/2019 and likely will discuss possible loop recorder placement No concerns at this time.  Denies new or worsening stroke/TIA symptoms.  Update 10/05/2019: [name] is a 78 year old female who is being seen today for stroke follow-up accompanied by her daughter.  Residual deficits of left-sided dysmetria, dysarthria and blurred vision.  She has since returned home from nursing home and currently lives with her husband with assistance from her daughter during the day.  Recently released from speech and occupational therapy but continues to receive physical therapy with ongoing improvement.  She has not had follow-up with ophthalmology at this time regarding visual loss.  Continues on aspirin 81 mg and Crestor for secondary stroke prevention without side effects.  Blood  pressure today 111/64.  Loop recorder placed by cardiology on 08/10/2019 which has not shown atrial fibrillation at this time.  No further concerns at  this time.   ROS:   14 system review of systems performed and negative with exception of blurred vision, walking difficulty and speech difficulty  PMH:  Past Medical History:  Diagnosis Date   Breast cancer ([facility])    Right foot pain Nov. 2014    PSH:  Past Surgical History:  Procedure Laterality Date   BREAST BIOPSY     BUBBLE STUDY  05/18/2019   Procedure: BUBBLE STUDY;  Surgeon: Elouise Munroe, MD;  Location: [facility] ENDOSCOPY;  Service: Cardiology;;   CATARACT EXTRACTION     both eyes   EYE SURGERY Left    MASTECTOMY W/ SENTINEL NODE BIOPSY Left 05/09/2019   Procedure: LEFT MASTECTOMY WITH LEFT AXILLARY SENTINEL LYMPH NODE BIOPSY;  Surgeon: Coralie Keens, MD;  Location: [place];  Service: General;  Laterality: Left;   TEE WITHOUT CARDIOVERSION N/A 05/18/2019   Procedure: TRANSESOPHAGEAL ECHOCARDIOGRAM (TEE);  Surgeon: Elouise Munroe, MD;  Location: [facility];  Service: Cardiology;  Laterality: N/A;    Social History:  Social History   Socioeconomic History   Marital status: Married    Spouse name: Not on file   Number of children: Not on file   Years of education: Not on file   Highest education level: Not on file  Occupational History    Comment: retired  Scientist, product/process development strain: Not on file   Food insecurity    Worry: Not on file    Inability: Not on Lexicographer needs    Medical: Not on file    Non-medical: Not on file  Tobacco Use   Smoking status: Current Every Day Smoker    Packs/day: 0.50    Years: 58.00    Pack years: 29.00    Types: Cigarettes   Smokeless tobacco: Never Used  Substance and Sexual Activity   Alcohol use: Yes    Alcohol/week: 7.0 standard drinks    Types: 7 Cans of beer per week    Comment: 1 beer every 1-2 days   Drug use: No   Sexual activity: Not Currently  Lifestyle   Physical activity    Days per week: Not on file    Minutes per session: Not on file   Stress: Not on file   Relationships   Social connections    Talks on phone: Not on file    Gets together: Not on file    Attends religious service: Not on file    Active member of club or organization: Not on file    Attends meetings of clubs or organizations: Not on file    Relationship status: Not on file   Intimate partner violence    Fear of current or ex partner: Not on file    Emotionally abused: Not on file    Physically abused: Not on file    Forced sexual activity: Not on file  Other Topics Concern   Not on file  Social History Narrative   Resides in [place]. Has one grown daughter. No grandchildren. No pets.     Family History:  Family History  Problem Relation Age of Onset   Cancer Mother        esophageal   Hypertension Mother    Varicose Veins Mother    Cancer Sister        non hodgkins  Cancer Other        unknown    Medications:   Current Outpatient Medications on File Prior to Visit  Medication Sig Dispense Refill   amLODipine (NORVASC) 5 MG tablet Take 5 mg by mouth daily.     aspirin EC 81 MG tablet Take 2 tablets by mouth daily.     letrozole (FEMARA) 2.5 MG tablet TAKE 1 TABLET ONCE DAILY. 90 tablet 0   mirtazapine (REMERON) 15 MG tablet Take 1 tablet (15 mg total) by mouth at bedtime.     rosuvastatin (CRESTOR) 10 MG tablet Take 10 mg by mouth daily.     No current facility-administered medications on file prior to visit.     Allergies:   Allergies  Allergen Reactions   Atorvastatin Other (See Comments)    Muscle pain      Physical Exam  Vitals:   10/05/19 0939  BP: 111/64  Pulse: 69  Temp: 97.6 F (36.4 C)  TempSrc: Oral  Weight: 120 lb 3.2 oz (54.5 kg)  Height: 5\' 7"  (1.702 m)   Body mass index is 18.83 kg/m. No exam data present  Depression screen [facility] 2/9 06/26/2019  Decreased Interest 0  Down, Depressed, Hopeless 0  PHQ - 2 Score 0    General: well developed, well nourished,  pleasant elderly Caucasian female, seated, in no  evident distress Head: head normocephalic and atraumatic.   Neck: supple with no carotid or supraclavicular bruits Cardiovascular: regular rate and rhythm, no murmurs Musculoskeletal: no deformity Skin:  no rash/petichiae Vascular:  Normal pulses all extremities   Neurologic Exam Mental Status: Awake and fully alert.  Moderate dysarthria. Oriented to place and time. Recent and remote memory intact. Attention span, concentration and fund of knowledge appropriate. Mood and affect appropriate.  Cranial Nerves: Fundoscopic exam reveals sharp disc margins. Pupils equal, briskly reactive to light. Extraocular movements full without nystagmus. Visual fields full to confrontation with subjective blurriness. Hearing intact. Facial sensation intact.  Left lower facial weakness. Motor: Normal bulk and tone. Normal strength in all tested extremity muscles. Sensory.: intact to touch , pinprick , position and vibratory sensation.  Coordination: Rapid alternating movements difficulty performing BUE. Finger-to-nose and heel-to-shin show dysmetria/ataxia in left upper and lower extremities.  RUE fine action tremors.  Gait and Station: Gait assessment deferred Reflexes: 1+ and symmetric. Toes downgoing.     Diagnostic Data (Labs, Imaging, Testing)   CT head bilateral cerebellar and left occipital infarcts  MRI multiple bilateral posterior circulation cerebellar infarcts in the occipital lobes bilaterally, left thalamus and left tectum  MRA diffuse intracranial atherosclerosis  CTA head and neck 50% stenosis of left vertebral artery origin otherwise no other large vessel stenosis or occlusion   carotid Doppler the CTA results  2D Echo EF 60-65%. No source of embolus. Negative bubble  TEE PFO with bidirectional shunting at rest but negative for endocarditis or cardiac source of embolus  LE Doppler negative for DVT  LDL 139 mg percent  HgbA1c 5.6    ASSESSMENT: Jessica Harrell is a 78 y.o.  year old female who presented to [facility] ED with somnolence and confusion on 05/15/2019 and soon transferred to [facility].  Stroke work-up revealed bilateral cerebellar, bilateral occipital, left thalamic and left abdomen infarcts embolic pattern secondary to unknown source with differentials including hypercoagulability with a cancer diagnosis or atrial fibrillation as endocarditis ruled out.  Recently underwent mastectomy on 05/09/2019.  Recommended loop recorder placement once mastectomy wound healed for atrial fibrillation monitoring.  Vascular risk factors include HTN, HLD, intracranial arthrosclerosis, advanced age, tobacco use and EtOH use.  Loop recorder placed in 08/2019 which has not shown atrial fibrillation thus far.  Discharge to [facility] rehabilitation and plans on returning home today with home health therapies.  Residual deficits of blurred vision, dysarthria, imbalance and left-sided ataxia    PLAN:  1. Cryptogenic stroke: Continue aspirin 81 mg daily  and rosuvastatin 10 mg daily for secondary stroke prevention. Maintain strict control of hypertension with blood pressure goal below 130/90, diabetes with hemoglobin A1c goal below 6.5% and cholesterol with LDL cholesterol (bad cholesterol) goal below 70 mg/dL.  I also advised the patient to eat a healthy diet with plenty of whole grains, cereals, fruits and vegetables, exercise regularly with at least 30 minutes of continuous activity daily and maintain ideal body weight.  Continue to monitor loop recorder for atrial fibrillation. 2. Residual deficits as above: Continue to work with PT for ongoing improvement of overall ambulation.  Referral placed to ophthalmology for further evaluation of continued blurry vision possible residual stroke deficit versus other underlying etiology 3. HTN: Advised to continue current treatment regimen.  Today's BP satisfactory.  Advised to continue to monitor at home along with continued follow-up with PCP for  management 4. HLD: Advised to continue current treatment regimen along with continued follow-up with PCP for future prescribing and monitoring of lipid panel   Follow up in 4 months or call earlier if needed   Greater than 50% of time during this 25 minute visit was spent on counseling, explanation of diagnosis of cryptogenic stroke, reviewing risk factor management of HTN and HLD, planning of further management along with potential future management, and discussion with patient and family answering all questions.    Frann Rider, AGNP-BC  [facility] [address] [place], [place] [address]  Phone [phone] Fax [phone] Note: This document was prepared with digital dictation and possible smart phrase technology. Any transcriptional errors that result from this process are unintentional.

## 2019-10-05 NOTE — Patient Instructions (Signed)
Referral placed to ophthalmology -you will be called to schedule initial visit  Continue to work with outpatient therapies for ongoing improvement  Continue aspirin 81 mg daily  and Crestor for secondary stroke prevention  Continue to follow up with PCP regarding cholesterol and blood pressure management   You will be called with any findings of abnormal heart rhythms on your loop recorder  Continue to monitor blood pressure at home  Maintain strict control of hypertension with blood pressure goal below 130/90, diabetes with hemoglobin A1c goal below 6.5% and cholesterol with LDL cholesterol (bad cholesterol) goal below 70 mg/dL. I also advised the patient to eat a healthy diet with plenty of whole grains, cereals, fruits and vegetables, exercise regularly and maintain ideal body weight.  Followup in the future with me in 4 months or call earlier if needed       Thank you for coming to see [place] at [facility]. I hope we have been able to provide you high quality care today.  You may receive a patient satisfaction survey over the next few weeks. We would appreciate your feedback and comments so that we may continue to improve ourselves and the health of our patients.

## 2019-10-05 NOTE — Progress Notes (Signed)
I agree with the above plan 

## 2019-10-06 ENCOUNTER — Ambulatory Visit (HOSPITAL_COMMUNITY): Payer: Medicare Other

## 2019-10-06 ENCOUNTER — Other Ambulatory Visit: Payer: Self-pay

## 2019-10-06 ENCOUNTER — Encounter (HOSPITAL_COMMUNITY): Payer: Self-pay

## 2019-10-06 DIAGNOSIS — R278 Other lack of coordination: Secondary | ICD-10-CM

## 2019-10-06 DIAGNOSIS — R29818 Other symptoms and signs involving the nervous system: Secondary | ICD-10-CM

## 2019-10-06 DIAGNOSIS — R2689 Other abnormalities of gait and mobility: Secondary | ICD-10-CM

## 2019-10-06 NOTE — Therapy (Signed)
[facility] [place], [place], [address] Phone: [phone]   Fax:  [phone]  Physical Therapy Treatment  Patient Details  Name: Jessica Harrell MRN: [id number] Date of Birth: Apr 29, 1941 Referring Provider (PT): Kathryne Eriksson MD   Encounter Date: 10/06/2019  PT End of Session - 10/06/19 1300    Visit Number  15    Number of Visits  24    Date for PT Re-Evaluation  10/25/19   Mini-reassess completed 09/22/19   Authorization Type  [employer] Medicare Visits based on Medical Necessity    Authorization Time Period  08/30/19 - 10/30/19    Authorization - Visit Number  15    Authorization - Number of Visits  20    PT Start Time  1300    PT Stop Time  1340    PT Time Calculation (min)  40 min    Equipment Utilized During Treatment  Gait belt;Other (comment)   Rolling walker   Activity Tolerance  Patient tolerated treatment well    Behavior During Therapy  WFL for tasks assessed/performed       Past Medical History:  Diagnosis Date  . Breast cancer ([place])   . Right foot pain Nov. 2014    Past Surgical History:  Procedure Laterality Date  . BREAST BIOPSY    . BUBBLE STUDY  05/18/2019   Procedure: BUBBLE STUDY;  Surgeon: Elouise Munroe, MD;  Location: [facility] ENDOSCOPY;  Service: Cardiology;;  . CATARACT EXTRACTION     both eyes  . EYE SURGERY Left   . MASTECTOMY W/ SENTINEL NODE BIOPSY Left 05/09/2019   Procedure: LEFT MASTECTOMY WITH LEFT AXILLARY SENTINEL LYMPH NODE BIOPSY;  Surgeon: Coralie Keens, MD;  Location: [place];  Service: General;  Laterality: Left;  . TEE WITHOUT CARDIOVERSION N/A 05/18/2019   Procedure: TRANSESOPHAGEAL ECHOCARDIOGRAM (TEE);  Surgeon: Elouise Munroe, MD;  Location: [place];  Service: Cardiology;  Laterality: N/A;    There were no vitals filed for this visit.  Subjective Assessment - 10/06/19 1300    Subjective  pt reports she went to the doctor yesterday (neuro) and the doctor said  to continue therapy if it is working. Doctor reports strength looks good but the coordination still needs work. Pt reports she is going to go to the eye doctor soon to address vision deficits.    Patient is accompained by:  Family member   Daughter: Jessica Harrell and spouse Jessica Harrell)   Pertinent History  CVA 05/14/19    Limitations  House hold activities;Standing;Walking    How long can you stand comfortably?  Requires assistance and support of walker    How long can you walk comfortably?  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  No/denies              [facility] Adult PT Treatment/Exercise - 10/06/19 0001      Transfers   Comments  pt and daughter performed car transfer, demonstrating good technique, appropriate hand placement and daughter with good knowledge and verbal cues to assist pt; no concerns noted      Ambulation/Gait   Gait Comments  gait training with RW, therapist encouraging pt to march or walk as fast as possible to improve weight shifting, equal bil step length, and  weight shift forward into balls of feet; pt ranged from min guard assist to mod assist with gait training to maintain upright balance, WC follow for safety      Knee/Hip Exercises: Standing   Other Standing Knee Exercises  Sidestepping at // with bil UE assist, x2RT; retro gait in // bars with BUE assist, x1RT          Balance Exercises - 10/06/19 1345      Balance Exercises: Standing   Marching Limitations  static marching, BUE assist, 3x30 sec; static marching, single UE assist, 3x30 sec; forward marching, //bars x2RT with BUE assist    Other Standing Exercises  standing weighshifts laterally with therapist providing pertubations at hips, 10x10 sec holds to R and L        PT Education - 10/06/19 1300     Education Details  Exercise technique, continue HEP    Person(s) Educated  Patient    Methods  Explanation    Comprehension  Verbalized understanding       PT Short Term Goals - 09/22/19 1131      PT SHORT TERM GOAL #1   Title  Patient will be educated on HEP and report regular compliance to improve safety and independence with functional mobility.    Time  4    Period  Weeks    Status  Achieved    Target Date  09/27/19        PT Long Term Goals - 09/22/19 1132      PT LONG TERM GOAL #1   Title  Patient will demonstrate ability to perform sit to stand transfer with modified independence to improve overall functional mobility.    Baseline  09/22/19- Min A, cueing for forward momentum    Time  8    Period  Weeks    Status  On-going      PT LONG TERM GOAL #2   Title  Patient will demonstrate ability to ambulate 100 feet with LRAD with no more than minimal assistance and minimal tactile and verbal cues for form.    Baseline  1-/16/20- able to ambulate 50' and 56' with rest break, using RW on level surface with Min A and frequent cueing for BLE sequencing, and keeping BOS within RW.    Time  8    Period  Weeks    Status  On-going      PT LONG TERM GOAL #3   Title  Patient will demonstrate improvement on the BBS of at least 6 points indicating improved balance and independence with functional mobility.    Time  8    Period  Weeks    Status  Deferred      PT LONG TERM GOAL #4   Title  Patient will demonstrate ability to correct standing posture and maintain standing balance for at least 1 minute with no more than minimal verbal cueing to improve independence with functional mobility.    Baseline  Able to do, but needs frequent cueing to avoid left leaning    Time  8    Period  Weeks    Status  On-going            Plan - 10/06/19 1352    Clinical Impression Statement  Performed static marching with // bars with BUE assist and contact guard assist able to flex each  LE equally and maintain SLS equally. Pt required min assist with single UE assist marching due to poor weight shifting with single  UE support. Pt required max constant verbal cues for marching of BLE with forward marching in // bars, fatigued quickly and with increasing posterior weight through heels requiring occasional max assist to correct back to upright normal postural alignment. Pt performed gait training with RW with cues to march BLE and to walk as fast as possible with RW. Pt with improved equal step length and R/L weight-shifting to step with opposite foot with increased speed cues, but pt requires mod/max assist with RW management and occasional mod assist with trunk uprighting to prevent loss of balance posterior due to balance frequently getting outside of BOS. Pt and pt's daughter to demonstrate good car transfer technique with appropriate verbal cues and no concerns noted from therapist. Continue to progress as able.    Personal Factors and Comorbidities  Age;Comorbidity 1    Comorbidities  Hx Breast Cancer, S/P CVA    Examination-Activity Limitations  Bathing;Hygiene/Grooming;Squat;Stairs;Bend;Lift;Locomotion Level;Stand;Transfers;Dressing    Examination-Participation Restrictions  Cleaning;Shop;Laundry;Community Activity;Meal Prep    Stability/Clinical Decision Making  Evolving/Moderate complexity    Rehab Potential  Good    PT Frequency  3x / week    PT Duration  8 weeks    PT Treatment/Interventions  ADLs/Self Care Home Management;Aquatic Therapy;Cryotherapy;Electrical Stimulation;Moist Heat;DME Instruction;Gait training;Stair training;Functional mobility training;Therapeutic activities;Therapeutic exercise;Balance training;Neuromuscular re-education;Patient/family education;Manual techniques;Passive range of motion;Dry needling;Energy conservation;Taping    PT Next Visit Plan  Attempt ambluation without AD or with hemiwalker. Ambulation in a closed environment (decreased stimulation to  improve focus), consider trialing a weighted gait belt to improve trunk proprioception.    PT Home Exercise Plan  9/28: seated stepping towards sticky note targets for coordination; 10/12: gait training 3x/day before/after sleep depending on fatigue, heel raises and mini squats at countertop for UE support with family at side; 10/19: lateral scooting while seated EOB from HOB<>foot of bed    Consulted and Agree with Plan of Care  Patient;Family member/caregiver    Family Member Consulted  spouse Jessica Harrell), daughter (Jessica Harrell)       Patient will benefit from skilled therapeutic intervention in order to improve the following deficits and impairments:  Abnormal gait, Impaired sensation, Improper body mechanics, Decreased coordination, Decreased mobility, Postural dysfunction, Decreased activity tolerance, Decreased endurance, Decreased strength, Decreased balance, Decreased safety awareness, Difficulty walking, Impaired vision/preception  Visit Diagnosis: Other symptoms and signs involving the nervous system  Other lack of coordination  Other abnormalities of gait and mobility     Problem List Patient Active Problem List   Diagnosis Date Noted  . Acute ischemic stroke ([place]) 05/15/2019  . Acute metabolic encephalopathy [id number]  . Encephalopathy   . History of left breast cancer 05/09/2019  . Malignant neoplasm of lower-inner quadrant of left breast in female, estrogen receptor positive ([place]) 04/26/2018    Jessica Harrell PT, DPT 10/06/19, 1:59 PM [facility] [address] [place], [place], [address] Phone: [phone]   Fax:  [phone]  Name: Jessica Harrell MRN: [id number] Date of Birth: May 22, 1941

## 2019-10-09 ENCOUNTER — Encounter (HOSPITAL_COMMUNITY): Payer: Self-pay

## 2019-10-09 ENCOUNTER — Other Ambulatory Visit: Payer: Self-pay

## 2019-10-09 ENCOUNTER — Ambulatory Visit (HOSPITAL_COMMUNITY): Payer: Medicare Other | Attending: Family Medicine

## 2019-10-09 DIAGNOSIS — R278 Other lack of coordination: Secondary | ICD-10-CM | POA: Diagnosis present

## 2019-10-09 DIAGNOSIS — R29818 Other symptoms and signs involving the nervous system: Secondary | ICD-10-CM | POA: Diagnosis not present

## 2019-10-09 DIAGNOSIS — R2689 Other abnormalities of gait and mobility: Secondary | ICD-10-CM | POA: Diagnosis present
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
## 2019-10-10 ENCOUNTER — Telehealth: Payer: Self-pay | Admitting: Adult Health

## 2019-10-10 NOTE — Telephone Encounter (Signed)
I talk with patients daughter about move from 12/18 to 12/22

## 2019-10-11 ENCOUNTER — Encounter (HOSPITAL_COMMUNITY): Payer: Self-pay | Admitting: Physical Therapy

## 2019-10-11 ENCOUNTER — Ambulatory Visit (HOSPITAL_COMMUNITY): Payer: Medicare Other | Admitting: Physical Therapy

## 2019-10-11 ENCOUNTER — Other Ambulatory Visit: Payer: Self-pay

## 2019-10-11 DIAGNOSIS — R278 Other lack of coordination: Secondary | ICD-10-CM

## 2019-10-11 DIAGNOSIS — R2689 Other abnormalities of gait and mobility: Secondary | ICD-10-CM

## 2019-10-11 DIAGNOSIS — R29818 Other symptoms and signs involving the nervous system: Secondary | ICD-10-CM

## 2019-10-11 NOTE — Therapy (Signed)
[place], [place], [address] Phone: [phone]   Fax:  [phone]  Physical Therapy Treatment  Patient Details  Name: Jessica Harrell MRN: [id number] Date of Birth: [birth year] Referring Provider (PT): Kathryne Eriksson MD   Encounter Date: 10/11/2019  PT End of Session - 10/11/19 1244    Visit Number  17    Number of Visits  24    Date for PT Re-Evaluation  10/25/19   Mini-reassess completed 09/22/19   Authorization Type  [employer] Medicare Visits based on Medical Necessity    Authorization Time Period  08/30/19 - 10/30/19    Authorization - Visit Number  17    Authorization - Number of Visits  20    PT Start Time  [id number]    PT Stop Time  1158    PT Time Calculation (min)  41 min    Equipment Utilized During Treatment  Gait belt;Other (comment)   Rolling walker; weighted gait belt   Activity Tolerance  Patient tolerated treatment well    Behavior During Therapy  WFL for tasks assessed/performed       Past Medical History:  Diagnosis Date  . Breast cancer ([place])   . Right foot pain Nov. 2014    Past Surgical History:  Procedure Laterality Date  . BREAST BIOPSY    . BUBBLE STUDY  05/18/2019   Procedure: BUBBLE STUDY;  Surgeon: Elouise Munroe, MD;  Location: [facility] ENDOSCOPY;  Service: Cardiology;;  . CATARACT EXTRACTION     both eyes  . EYE SURGERY Left   . MASTECTOMY W/ SENTINEL NODE BIOPSY Left 05/09/2019   Procedure: LEFT MASTECTOMY WITH LEFT AXILLARY SENTINEL LYMPH NODE BIOPSY;  Surgeon: Coralie Keens, MD;  Location: [place];  Service: General;  Laterality: Left;  . TEE WITHOUT CARDIOVERSION N/A 05/18/2019   Procedure: TRANSESOPHAGEAL ECHOCARDIOGRAM (TEE);  Surgeon: Elouise Munroe, MD;  Location: [place];  Service: Cardiology;  Laterality: N/A;    There were no vitals filed for this visit.  Subjective Assessment - 10/11/19 1243    Subjective  Patient denied any pain and reported back discomfort  has improved. Reported that patient has seemed more active at home since beginning cardiovascular exercise in therapy.    Patient is accompained by:  Family member    Pertinent History  CVA 05/14/19    Limitations  House hold activities;Standing;Walking    How long can you stand comfortably?  Requires assistance and support of walker    How long can you walk comfortably?  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  No/denies                       [facility] Adult PT Treatment/Exercise - 10/11/19 0001      Ambulation/Gait   Gait Comments  gait training with RW and weighted gait belt (3 2# weights) to improve trunk proprioception, therapist encouraging pt to walk as fast as possible to improve weight shifting, equal bil step length, and weight shift anteriorly therapist providing moderate assistance with verbal cues for sequence of "walker, foot, other foot" 1x35' and 1x60'; ambulation without AD and HHA to recumbent bike and from recumbent bike and with mod A for balance  Knee/Hip Exercises: Aerobic   Recumbent Bike  5 minutes, level 4   Cues to increase RPM      Knee/Hip Exercises: Seated   Sit to Sand  --   6 repetitions with lateral weight shifting in standing mod A            PT Education - 10/11/19 1244    Education Details  Educated on purpose and technique of interventions throughout session.    Person(s) Educated  Patient;Child(ren);Spouse    Methods  Explanation    Comprehension  Verbalized understanding       PT Short Term Goals - 09/22/19 1131      PT SHORT TERM GOAL #1   Title  Patient will be educated on HEP and report regular compliance to improve safety and independence with functional mobility.    Time  4    Period  Weeks    Status  Achieved     Target Date  09/27/19        PT Long Term Goals - 09/22/19 1132      PT LONG TERM GOAL #1   Title  Patient will demonstrate ability to perform sit to stand transfer with modified independence to improve overall functional mobility.    Baseline  09/22/19- Min A, cueing for forward momentum    Time  8    Period  Weeks    Status  On-going      PT LONG TERM GOAL #2   Title  Patient will demonstrate ability to ambulate 100 feet with LRAD with no more than minimal assistance and minimal tactile and verbal cues for form.    Baseline  1-/16/20- able to ambulate 50' and 7' with rest break, using RW on level surface with Min A and frequent cueing for BLE sequencing, and keeping BOS within RW.    Time  8    Period  Weeks    Status  On-going      PT LONG TERM GOAL #3   Title  Patient will demonstrate improvement on the BBS of at least 6 points indicating improved balance and independence with functional mobility.    Time  8    Period  Weeks    Status  Deferred      PT LONG TERM GOAL #4   Title  Patient will demonstrate ability to correct standing posture and maintain standing balance for at least 1 minute with no more than minimal verbal cueing to improve independence with functional mobility.    Baseline  Able to do, but needs frequent cueing to avoid left leaning    Time  8    Period  Weeks    Status  On-going            Plan - 10/11/19 1256    Clinical Impression Statement  Patient continued to demonstrate improvement with cues to speed up in terms of step length and weight shifting. However, patient did require more assistance this session to maintain balance anteriorly and to reduce lateral lean to the left. Trialed weighted gait belt this session to improve trunk proprioception. Patient would benefit from continued skilled physical therapy in order to continue progressing towards functional goals.    Personal Factors and Comorbidities  Age;Comorbidity 1    Comorbidities  Hx  Breast Cancer, S/P CVA    Examination-Activity Limitations  Bathing;Hygiene/Grooming;Squat;Stairs;Bend;Lift;Locomotion Level;Stand;Transfers;Dressing    Examination-Participation Restrictions  Cleaning;Shop;Laundry;Community Activity;Meal Prep    Stability/Clinical Decision Making  Evolving/Moderate complexity    Rehab  Potential  Good    PT Frequency  3x / week    PT Duration  8 weeks    PT Treatment/Interventions  ADLs/Self Care Home Management;Aquatic Therapy;Cryotherapy;Electrical Stimulation;Moist Heat;DME Instruction;Gait training;Stair training;Functional mobility training;Therapeutic activities;Therapeutic exercise;Balance training;Neuromuscular re-education;Patient/family education;Manual techniques;Passive range of motion;Dry needling;Energy conservation;Taping    PT Next Visit Plan  Continue increased gait velocity with ambulation. Trial weighted vest again.    PT Home Exercise Plan  9/28: seated stepping towards sticky note targets for coordination; 10/12: gait training 3x/day before/after sleep depending on fatigue, heel raises and mini squats at countertop for UE support with family at side; 10/19: lateral scooting while seated EOB from HOB<>foot of bed    Consulted and Agree with Plan of Care  Patient;Family member/caregiver    Family Member Consulted  spouse [name]), daughter ([place])       Patient will benefit from skilled therapeutic intervention in order to improve the following deficits and impairments:  Abnormal gait, Impaired sensation, Improper body mechanics, Decreased coordination, Decreased mobility, Postural dysfunction, Decreased activity tolerance, Decreased endurance, Decreased strength, Decreased balance, Decreased safety awareness, Difficulty walking, Impaired vision/preception  Visit Diagnosis: Other symptoms and signs involving the nervous system  Other lack of coordination  Other abnormalities of gait and mobility     Problem List Patient Active Problem  List   Diagnosis Date Noted  . Acute ischemic stroke ([place]) 05/15/2019  . Acute metabolic encephalopathy [id number]  . Encephalopathy   . History of left breast cancer 05/09/2019  . Malignant neoplasm of lower-inner quadrant of left breast in female, estrogen receptor positive ([place]) 04/26/2018   Clarene Critchley PT, DPT 1:01 PM, 10/11/19 [place], [place], [address] Phone: [phone]   Fax:  [phone]  Name: Jessica Harrell MRN: [id number] Date of Birth: Apr 13, 1941

## 2019-10-13 ENCOUNTER — Ambulatory Visit (HOSPITAL_COMMUNITY): Payer: Medicare Other | Admitting: Physical Therapy

## 2019-10-13 ENCOUNTER — Encounter (HOSPITAL_COMMUNITY): Payer: Self-pay | Admitting: Physical Therapy

## 2019-10-13 ENCOUNTER — Other Ambulatory Visit: Payer: Self-pay

## 2019-10-13 DIAGNOSIS — R29818 Other symptoms and signs involving the nervous system: Secondary | ICD-10-CM

## 2019-10-13 DIAGNOSIS — R2689 Other abnormalities of gait and mobility: Secondary | ICD-10-CM

## 2019-10-13 DIAGNOSIS — R278 Other lack of coordination: Secondary | ICD-10-CM

## 2019-10-13 NOTE — Therapy (Addendum)
[place], [place], [address] Phone: [phone]   Fax:  [phone]  Physical Therapy Treatment  Patient Details  Name: Jessica Harrell MRN: [id number] Date of Birth: Apr 19, 1941 Referring Provider (PT): Kathryne Eriksson MD   Encounter Date: 10/13/2019     10/13/19 1246  PT Visits / Re-Eval  Visit Number 18  Number of Visits 24  Date for PT Re-Evaluation 10/25/19 (Mini-reassess completed 09/22/19)  Authorization  Authorization Type [facility] Medicare Visits based on Medical Necessity  Authorization Time Period 08/30/19 - 10/30/19  Authorization - Visit Number 18  Authorization - Number of Visits 20  PT Time Calculation  PT Start Time [id number]  PT Stop Time 1157  PT Time Calculation (min) 41 min  PT - End of Session  Equipment Utilized During Treatment Gait belt;Other (comment) (Rolling walker; weighted gait belt)  Activity Tolerance Patient tolerated treatment well  Behavior During Therapy WFL for tasks assessed/performed    Past Medical History:  Diagnosis Date  . Breast cancer ([name])   . Right foot pain Nov. 2014    Past Surgical History:  Procedure Laterality Date  . BREAST BIOPSY    . BUBBLE STUDY  05/18/2019   Procedure: BUBBLE STUDY;  Surgeon: Elouise Munroe, MD;  Location: [facility] ENDOSCOPY;  Service: Cardiology;;  . CATARACT EXTRACTION     both eyes  . EYE SURGERY Left   . MASTECTOMY W/ SENTINEL NODE BIOPSY Left 05/09/2019   Procedure: LEFT MASTECTOMY WITH LEFT AXILLARY SENTINEL LYMPH NODE BIOPSY;  Surgeon: Coralie Keens, MD;  Location: [place];  Service: General;  Laterality: Left;  . TEE WITHOUT CARDIOVERSION N/A 05/18/2019   Procedure: TRANSESOPHAGEAL ECHOCARDIOGRAM (TEE);  Surgeon: Elouise Munroe, MD;  Location: [place];  Service: Cardiology;  Laterality: N/A;    There were no vitals filed for this visit.  Subjective Assessment - 10/13/19 1113    Subjective  Patient denies any pain  today. Family states everything has been alright at home. She was dizzy when she first gets up in the morning. They tell her to march when her feet are getting "stuck".    Patient is accompained by:  Family member    Pertinent History  CVA 05/14/19    Limitations  House hold activities;Standing;Walking    How long can you stand comfortably?  Requires assistance and support of walker    How long can you walk comfortably?  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  No/denies                       [facility] Adult PT Treatment/Exercise - 10/13/19 0001      Ambulation/Gait   Gait Comments  gait training with RW and weighted gait belt (2 2# weights) to improve trunk proprioception, therapist encouraging standing up tall, decreasing posterior lean, weight shifting, equal bil step length, and weight shift anteriorly therapist providing moderate assistance with verbal cues for sequence of BLE 1x35' and 1x25'; ambulation without AD and HHA to recumbent bike and from recumbent bike and with mod A for balance, provided tactile cueing for weight shift to stance leg to advance contralateral LE      Knee/Hip Exercises: Aerobic   Recumbent Bike  5 minutes, level 4  Knee/Hip Exercises: Seated   Sit to Sand  1 set;10 reps;with UE support;Other (comment)   min/mod assist. followed by lateral weight shifting x10 x30"            PT Education - 10/13/19 1233    Education Details  Educated on purpose and technique of interventions throughout session and facilitating weight shift during ambulation    Person(s) Educated  Patient;Spouse;Child(ren)    Methods  Explanation    Comprehension  Verbalized understanding       PT Short Term Goals - 09/22/19 1131      PT SHORT TERM GOAL #1    Title  Patient will be educated on HEP and report regular compliance to improve safety and independence with functional mobility.    Time  4    Period  Weeks    Status  Achieved    Target Date  09/27/19        PT Long Term Goals - 09/22/19 1132      PT LONG TERM GOAL #1   Title  Patient will demonstrate ability to perform sit to stand transfer with modified independence to improve overall functional mobility.    Baseline  09/22/19- Min A, cueing for forward momentum    Time  8    Period  Weeks    Status  On-going      PT LONG TERM GOAL #2   Title  Patient will demonstrate ability to ambulate 100 feet with LRAD with no more than minimal assistance and minimal tactile and verbal cues for form.    Baseline  1-/16/20- able to ambulate 50' and 32' with rest break, using RW on level surface with Min A and frequent cueing for BLE sequencing, and keeping BOS within RW.    Time  8    Period  Weeks    Status  On-going      PT LONG TERM GOAL #3   Title  Patient will demonstrate improvement on the BBS of at least 6 points indicating improved balance and independence with functional mobility.    Time  8    Period  Weeks    Status  Deferred      PT LONG TERM GOAL #4   Title  Patient will demonstrate ability to correct standing posture and maintain standing balance for at least 1 minute with no more than minimal verbal cueing to improve independence with functional mobility.    Baseline  Able to do, but needs frequent cueing to avoid left leaning    Time  8    Period  Weeks    Status  On-going            Plan - 10/13/19 1234    Clinical Impression Statement  Patient demonstrates improvement with transitions from sit to and from standing to wheelchair and uses arms with intermittent verbal cueing for UE assist. She requires verbal and tactile cueing while ambulating for appropriate lateral weight shifting, decreased posterior lean, and proper foot/walker sequencing. She requires  frequent rest breaks secondary to limited activity tolerance but only requires short breaks. Patient will continue to benefit from skilled physical therapy in order to improve function and reduce impairment.    Personal Factors and Comorbidities  Age;Comorbidity 1    Comorbidities  Hx Breast Cancer, S/P CVA    Examination-Activity Limitations  Bathing;Hygiene/Grooming;Squat;Stairs;Bend;Lift;Locomotion Level;Stand;Transfers;Dressing    Examination-Participation Restrictions  Cleaning;Shop;Laundry;Community Activity;Meal Prep    Stability/Clinical Decision Making  Evolving/Moderate complexity    Rehab Potential  Good    PT Frequency  3x / week    PT Duration  8 weeks    PT Treatment/Interventions  ADLs/Self Care Home Management;Aquatic Therapy;Cryotherapy;Electrical Stimulation;Moist Heat;DME Instruction;Gait training;Stair training;Functional mobility training;Therapeutic activities;Therapeutic exercise;Balance training;Neuromuscular re-education;Patient/family education;Manual techniques;Passive range of motion;Dry needling;Energy conservation;Taping    PT Next Visit Plan  Continue increased gait velocity with ambulation. Trial weighted vest again.    PT Home Exercise Plan  9/28: seated stepping towards sticky note targets for coordination; 10/12: gait training 3x/day before/after sleep depending on fatigue, heel raises and mini squats at countertop for UE support with family at side; 10/19: lateral scooting while seated EOB from HOB<>foot of bed    Consulted and Agree with Plan of Care  Patient;Family member/caregiver    Family Member Consulted  spouse [name]), daughter ([place])       Patient will benefit from skilled therapeutic intervention in order to improve the following deficits and impairments:  Abnormal gait, Impaired sensation, Improper body mechanics, Decreased coordination, Decreased mobility, Postural dysfunction, Decreased activity tolerance, Decreased endurance, Decreased strength,  Decreased balance, Decreased safety awareness, Difficulty walking, Impaired vision/preception  Visit Diagnosis: Other symptoms and signs involving the nervous system  Other lack of coordination  Other abnormalities of gait and mobility     Problem List Patient Active Problem List   Diagnosis Date Noted  . Acute ischemic stroke ([place]) 05/15/2019  . Acute metabolic encephalopathy [id number]  . Encephalopathy   . History of left breast cancer 05/09/2019  . Malignant neoplasm of lower-inner quadrant of left breast in female, estrogen receptor positive ([place]) 04/26/2018    12:45 PM, 10/13/19 Mearl Latin PT, DPT Physical Therapist at [place], [place], [address] Phone: [phone]   Fax:  [phone]  Name: Jessica Harrell MRN: [id number] Date of Birth: [birth year]

## 2019-10-16 ENCOUNTER — Ambulatory Visit (HOSPITAL_COMMUNITY): Payer: Medicare Other

## 2019-10-17 ENCOUNTER — Telehealth (HOSPITAL_COMMUNITY): Payer: Self-pay | Admitting: Physical Therapy

## 2019-10-17 ENCOUNTER — Ambulatory Visit (HOSPITAL_COMMUNITY): Payer: Medicare Other | Admitting: Physical Therapy

## 2019-10-17 NOTE — Telephone Encounter (Signed)
pt's dtr called to cancel todqy's appt due to her mom fell and hurt her foot

## 2019-10-18 ENCOUNTER — Encounter (HOSPITAL_COMMUNITY): Payer: Self-pay | Admitting: Physical Therapy

## 2019-10-18 ENCOUNTER — Ambulatory Visit (HOSPITAL_COMMUNITY): Payer: Medicare Other | Admitting: Physical Therapy

## 2019-10-18 ENCOUNTER — Other Ambulatory Visit: Payer: Self-pay

## 2019-10-18 DIAGNOSIS — R2689 Other abnormalities of gait and mobility: Secondary | ICD-10-CM

## 2019-10-18 DIAGNOSIS — R29818 Other symptoms and signs involving the nervous system: Secondary | ICD-10-CM

## 2019-10-18 DIAGNOSIS — R278 Other lack of coordination: Secondary | ICD-10-CM

## 2019-10-18 NOTE — Therapy (Signed)
[place], [place], [address] Phone: [phone]   Fax:  [phone]  Physical Therapy Treatment  Patient Details  Name: Jessica Harrell MRN: [id number] Date of Birth: Aug 25, 1941 Referring Provider (PT): Kathryne Eriksson MD   Encounter Date: 10/18/2019  PT End of Session - 10/18/19 1136    Visit Number  19    Number of Visits  24    Date for PT Re-Evaluation  10/25/19   Mini-reassess completed 09/22/19   Authorization Type  [employer] Medicare Visits based on Medical Necessity    Authorization Time Period  08/30/19 - 10/30/19    Authorization - Visit Number  19    Authorization - Number of Visits  20    PT Start Time  [id number]    PT Stop Time  1159    PT Time Calculation (min)  42 min    Equipment Utilized During Treatment  Gait belt;Other (comment)   Rolling walker   Activity Tolerance  Patient tolerated treatment well    Behavior During Therapy  WFL for tasks assessed/performed       Past Medical History:  Diagnosis Date  . Breast cancer ([employer])   . Right foot pain Nov. 2014    Past Surgical History:  Procedure Laterality Date  . BREAST BIOPSY    . BUBBLE STUDY  05/18/2019   Procedure: BUBBLE STUDY;  Surgeon: Elouise Munroe, MD;  Location: [facility] ENDOSCOPY;  Service: Cardiology;;  . CATARACT EXTRACTION     both eyes  . EYE SURGERY Left   . MASTECTOMY W/ SENTINEL NODE BIOPSY Left 05/09/2019   Procedure: LEFT MASTECTOMY WITH LEFT AXILLARY SENTINEL LYMPH NODE BIOPSY;  Surgeon: Coralie Keens, MD;  Location: [place];  Service: General;  Laterality: Left;  . TEE WITHOUT CARDIOVERSION N/A 05/18/2019   Procedure: TRANSESOPHAGEAL ECHOCARDIOGRAM (TEE);  Surgeon: Elouise Munroe, MD;  Location: [place];  Service: Cardiology;  Laterality: N/A;    There were no vitals filed for this visit.  Subjective Assessment - 10/18/19 1135    Subjective  Patient fell on Monday when trying to get out of bed and now she has  bruising on her R ankle/top of foot and there is a knot on top of her ankle. She denies any pain today. They have been bruising and wrapping her ankle. She has not had any imaging. She almost had several other falls now when her R leg appears to "give out".    Patient is accompained by:  Family member    Pertinent History  CVA 05/14/19    Limitations  House hold activities;Standing;Walking    How long can you stand comfortably?  Requires assistance and support of walker    How long can you walk comfortably?  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  Yes    Pain Score  --   hurts a little bit   Pain Location  Ankle                       [facility] Adult PT Treatment/Exercise - 10/18/19 0001      Ambulation/Gait   Gait Comments  gait training with RW, therapist encouraging standing up tall, decreasing posterior lean, weight shifting, equal bil step length, and  weight shift anteriorly therapist providing moderate assistance with verbal cues for sequence of BLE 1x35''; ambulation with AD to recumbent bike and from recumbent bike and with mod A for balance, provided tactile cueing for weight shift to stance leg to advance contralateral LE      Knee/Hip Exercises: Aerobic   Recumbent Bike  5 minutes, level 4      Knee/Hip Exercises: Seated   Sit to Sand  1 set;10 reps;with UE support;Other (comment)   min assist and verbal cueing, weight shifting in standing            PT Education - 10/18/19 1136    Education Details  Educated on purpose and technique of interventions throughout session and facilitating weight shift during ambulation    Person(s) Educated  Patient;Spouse;Child(ren)    Methods  Explanation    Comprehension  Verbalized understanding       PT Short Term  Goals - 09/22/19 1131      PT SHORT TERM GOAL #1   Title  Patient will be educated on HEP and report regular compliance to improve safety and independence with functional mobility.    Time  4    Period  Weeks    Status  Achieved    Target Date  09/27/19        PT Long Term Goals - 09/22/19 1132      PT LONG TERM GOAL #1   Title  Patient will demonstrate ability to perform sit to stand transfer with modified independence to improve overall functional mobility.    Baseline  09/22/19- Min A, cueing for forward momentum    Time  8    Period  Weeks    Status  On-going      PT LONG TERM GOAL #2   Title  Patient will demonstrate ability to ambulate 100 feet with LRAD with no more than minimal assistance and minimal tactile and verbal cues for form.    Baseline  1-/16/20- able to ambulate 50' and 38' with rest break, using RW on level surface with Min A and frequent cueing for BLE sequencing, and keeping BOS within RW.    Time  8    Period  Weeks    Status  On-going      PT LONG TERM GOAL #3   Title  Patient will demonstrate improvement on the BBS of at least 6 points indicating improved balance and independence with functional mobility.    Time  8    Period  Weeks    Status  Deferred      PT LONG TERM GOAL #4   Title  Patient will demonstrate ability to correct standing posture and maintain standing balance for at least 1 minute with no more than minimal verbal cueing to improve independence with functional mobility.    Baseline  Able to do, but needs frequent cueing to avoid left leaning    Time  8    Period  Weeks    Status  On-going            Plan - 10/18/19 1138    Clinical Impression Statement  Patient has minimal bruising and swelling around R ATFL and lateral malleolus which hurts minimally with palpation as a result of a fall on Monday. She is able to weight bear and ambulate to bike with frequent verbal and tactile cueing and AD. She experiences increase in ankle  symptoms while attempting to ambulate longer distance but is able to perform sit  to stand/weight shifting without increase in symptoms. Patient showing improvement with less verbal cueing needed for use of UE for reaching for chair and pushing up with UE. Patient will continue to benefit from skilled physical therapy in order to improve function and reduce impairment.    Personal Factors and Comorbidities  Age;Comorbidity 1    Comorbidities  Hx Breast Cancer, S/P CVA    Examination-Activity Limitations  Bathing;Hygiene/Grooming;Squat;Stairs;Bend;Lift;Locomotion Level;Stand;Transfers;Dressing    Examination-Participation Restrictions  Cleaning;Shop;Laundry;Community Activity;Meal Prep    Stability/Clinical Decision Making  Evolving/Moderate complexity    Rehab Potential  Good    PT Frequency  3x / week    PT Duration  8 weeks    PT Treatment/Interventions  ADLs/Self Care Home Management;Aquatic Therapy;Cryotherapy;Electrical Stimulation;Moist Heat;DME Instruction;Gait training;Stair training;Functional mobility training;Therapeutic activities;Therapeutic exercise;Balance training;Neuromuscular re-education;Patient/family education;Manual techniques;Passive range of motion;Dry needling;Energy conservation;Taping    PT Next Visit Plan  Continue increased gait velocity with ambulation. Trial weighted vest again.    PT Home Exercise Plan  9/28: seated stepping towards sticky note targets for coordination; 10/12: gait training 3x/day before/after sleep depending on fatigue, heel raises and mini squats at countertop for UE support with family at side; 10/19: lateral scooting while seated EOB from HOB<>foot of bed    Consulted and Agree with Plan of Care  Patient;Family member/caregiver    Family Member Consulted  spouse [name]), daughter ([place])       Patient will benefit from skilled therapeutic intervention in order to improve the following deficits and impairments:  Abnormal gait, Impaired sensation,  Improper body mechanics, Decreased coordination, Decreased mobility, Postural dysfunction, Decreased activity tolerance, Decreased endurance, Decreased strength, Decreased balance, Decreased safety awareness, Difficulty walking, Impaired vision/preception  Visit Diagnosis: Other symptoms and signs involving the nervous system  Other lack of coordination  Other abnormalities of gait and mobility     Problem List Patient Active Problem List   Diagnosis Date Noted  . Acute ischemic stroke ([place]) 05/15/2019  . Acute metabolic encephalopathy [id number]  . Encephalopathy   . History of left breast cancer 05/09/2019  . Malignant neoplasm of lower-inner quadrant of left breast in female, estrogen receptor positive ([place]) 04/26/2018    12:14 PM, 10/18/19 Mearl Latin PT, DPT Physical Therapist at [place], [place], [address] Phone: [phone]   Fax:  [phone]  Name: Jessica Harrell MRN: [id number] Date of Birth: 03-15-1941

## 2019-10-20 ENCOUNTER — Ambulatory Visit (HOSPITAL_COMMUNITY): Payer: Medicare Other | Admitting: Physical Therapy

## 2019-10-20 ENCOUNTER — Encounter (HOSPITAL_COMMUNITY): Payer: Self-pay | Admitting: Physical Therapy

## 2019-10-20 ENCOUNTER — Other Ambulatory Visit: Payer: Self-pay

## 2019-10-20 DIAGNOSIS — R29818 Other symptoms and signs involving the nervous system: Secondary | ICD-10-CM

## 2019-10-20 DIAGNOSIS — R278 Other lack of coordination: Secondary | ICD-10-CM

## 2019-10-20 DIAGNOSIS — R2689 Other abnormalities of gait and mobility: Secondary | ICD-10-CM

## 2019-10-20 NOTE — Therapy (Signed)
[place], [place], [address] Phone: [phone]   Fax:  [phone]  Physical Therapy Treatment/ Progress Note  Patient Details  Name: Jessica Harrell MRN: [id number] Date of Birth: 12-05-1941 Referring Provider (PT): Kathryne Eriksson MD   Encounter Date: 10/20/2019   Progress Note   Reporting Period 09/22/2019 to 10/20/2019   See note below for Objective Data and Assessment of Progress/Goals   PT End of Session - 10/20/19 1140    Visit Number  20    Number of Visits  34    Date for PT Re-Evaluation  11/24/19    Authorization Type  [facility] Medicare Visits based on Medical Necessity    Authorization Time Period  08/30/19 - 10/30/19; 10/20/19-11/24/19    Authorization - Visit Number  20    Authorization - Number of Visits  30    PT Start Time  5997    PT Stop Time  1158    PT Time Calculation (min)  41 min    Equipment Utilized During Treatment  Gait belt;Other (comment)   Rolling walker   Activity Tolerance  Patient tolerated treatment well    Behavior During Therapy  WFL for tasks assessed/performed       Past Medical History:  Diagnosis Date  . Breast cancer ([place])   . Right foot pain Nov. 2014    Past Surgical History:  Procedure Laterality Date  . BREAST BIOPSY    . BUBBLE STUDY  05/18/2019   Procedure: BUBBLE STUDY;  Surgeon: Elouise Munroe, MD;  Location: [facility] ENDOSCOPY;  Service: Cardiology;;  . CATARACT EXTRACTION     both eyes  . EYE SURGERY Left   . MASTECTOMY W/ SENTINEL NODE BIOPSY Left 05/09/2019   Procedure: LEFT MASTECTOMY WITH LEFT AXILLARY SENTINEL LYMPH NODE BIOPSY;  Surgeon: Coralie Keens, MD;  Location: [place];  Service: General;  Laterality: Left;  . TEE WITHOUT CARDIOVERSION N/A 05/18/2019   Procedure: TRANSESOPHAGEAL ECHOCARDIOGRAM (TEE);  Surgeon: Elouise Munroe, MD;  Location: [place];  Service: Cardiology;  Laterality: N/A;    There were no vitals filed for this  visit.  Subjective Assessment - 10/20/19 1115    Subjective  Patient's family states she has not had any more falls. She's been able to walk with them at home some. Her foot is doing well and not painful. Patient goal is still to walk and she feels she has made a lot of progress. She is starting to sequence things better. Her family would like for her to be able to walk again.    Patient is accompained by:  Family member    Pertinent History  CVA 05/14/19    Limitations  House hold activities;Standing;Walking    How long can you stand comfortably?  Requires assistance and support of walker    How long can you walk comfortably?  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  No/denies         [facility] PT Assessment - 10/20/19 0001      Assessment   Medical Diagnosis  s/p CVA    Referring Provider (PT)  Kathryne Eriksson MD    Onset Date/Surgical Date  05/14/19    Hand Dominance  Right    Prior Therapy  Yes 30 days at [facility], and 60 days of [facility] 2-3 times per week      Precautions   Precautions  Fall;Other (comment)    Precaution Comments  Visual deficit- cortical blindness. Use contrast for objects during session.       Restrictions   Weight Bearing Restrictions  No      Prior Function   Level of Independence  Independent      Cognition   Overall Cognitive Status  Impaired/Different from baseline    Area of Impairment  Orientation;Attention;Memory;Safety/judgement    Orientation Level  Time                   [facility] Adult PT Treatment/Exercise - 10/20/19 0001      Ambulation/Gait   Gait Comments  gait training with HHA and min/mod assist for balance and provided tactile cueing for weight shifting provided by therapist ambulating to/from recumbent bike x 30 feet;  gait with RW, therapist encouraging standing up tall, decreasing posterior lean, weight shifting, equal bil step length, and weight shift anteriorly therapist providing moderate assistance with verbal cues for sequence of BLE 3x75'      Knee/Hip Exercises: Aerobic   Recumbent Bike  8 minutes, level 4      Knee/Hip Exercises: Seated   Sit to Sand  1 set;10 reps;with UE support;Other (comment)   min assist and verbal cueing              PT Short Term Goals - 10/20/19 1123      PT SHORT TERM GOAL #1   Title  Patient will be educated on HEP and report regular compliance to improve safety and independence with functional mobility.    Time  4    Period  Weeks    Status  Achieved    Target Date  09/27/19        PT Long Term Goals - 10/20/19 1123      PT LONG TERM GOAL #1   Title  Patient will demonstrate ability to perform sit to stand transfer with modified independence to improve overall functional mobility.    Baseline  09/22/19- Min A, cueing for forward momentum 10/20/19: min A with verbal cueing to push off WC and shift hips anteriorly    Time  5    Period  Weeks    Status  On-going      PT LONG TERM GOAL #2   Title  Patient will demonstrate ability to ambulate 100 feet with LRAD with no more than minimal assistance and minimal tactile and verbal cues for form.    Baseline  1-/16/20- able to ambulate 56' and 38' with rest break, using RW on level surface with Min A and frequent cueing for BLE sequencing, and keeping BOS within RW. 10/20/19: min to mod assist    Time  5    Period  Weeks    Status  On-going      PT LONG TERM GOAL #3   Title  Patient's caregivers/family will demonstrate ability to transfer and ambulate with patient with proper body mechanics and techniques for proper exercise at home.    Time  5    Period  Weeks    Status  New      PT LONG TERM GOAL #4   Title  Patient will demonstrate ability to correct standing posture and maintain standing balance  for at least 1 minute with no more than minimal verbal cueing to improve independence  with functional mobility.    Baseline  Able to do, but needs frequent cueing to avoid left leaning    Time  5    Period  Weeks    Status  On-going            Plan - 10/20/19 1141    Clinical Impression Statement  Patient has met 1/1 short term goal and 0/4 long term goals, with one prior goal being removed and one being added. Patient has improved gait pattern and improved sequencing with ambulation and transfers but continues to require min to mod assist as well as verbal and tactile cueing. Patient able to demonstrate carry over between sessions with activities being focused on during sessions. Patient able to ambulate increased distance today before experiencing fatigue. Patient demonstrates improving functional strength, gait, and mobility but still remains limited at this time. Patient will continue to benefit from skilled physical therapy in order to improve function and reduce impairment.    Personal Factors and Comorbidities  Age;Comorbidity 1    Comorbidities  Hx Breast Cancer, S/P CVA    Examination-Activity Limitations  Bathing;Hygiene/Grooming;Squat;Stairs;Bend;Lift;Locomotion Level;Stand;Transfers;Dressing    Examination-Participation Restrictions  Cleaning;Shop;Laundry;Community Activity;Meal Prep    Stability/Clinical Decision Making  Evolving/Moderate complexity    Rehab Potential  Good    PT Frequency  2x / week    PT Duration  Other (comment)   5 weeks   PT Treatment/Interventions  ADLs/Self Care Home Management;Aquatic Therapy;Cryotherapy;Electrical Stimulation;Moist Heat;DME Instruction;Gait training;Stair training;Functional mobility training;Therapeutic activities;Therapeutic exercise;Balance training;Neuromuscular re-education;Patient/family education;Manual techniques;Passive range of motion;Dry needling;Energy conservation;Taping    PT Next Visit Plan  Continue increased gait  velocity with ambulation.    PT Home Exercise Plan  9/28: seated stepping towards sticky note targets for coordination; 10/12: gait training 3x/day before/after sleep depending on fatigue, heel raises and mini squats at countertop for UE support with family at side; 10/19: lateral scooting while seated EOB from HOB<>foot of bed    Consulted and Agree with Plan of Care  Patient;Family member/caregiver    Family Member Consulted  spouse [name]), daughter ([place])       Patient will benefit from skilled therapeutic intervention in order to improve the following deficits and impairments:  Abnormal gait, Impaired sensation, Improper body mechanics, Decreased coordination, Decreased mobility, Postural dysfunction, Decreased activity tolerance, Decreased endurance, Decreased strength, Decreased balance, Decreased safety awareness, Difficulty walking, Impaired vision/preception  Visit Diagnosis: Other symptoms and signs involving the nervous system  Other lack of coordination  Other abnormalities of gait and mobility     Problem List Patient Active Problem List   Diagnosis Date Noted  . Acute ischemic stroke ([place]) 05/15/2019  . Acute metabolic encephalopathy 74/14/2395  . Encephalopathy   . History of left breast cancer 05/09/2019  . Malignant neoplasm of lower-inner quadrant of left breast in female, estrogen receptor positive ([place]) 04/26/2018    2:24 PM, 10/20/19 Mearl Latin PT, DPT Physical Therapist at [facility] [place], [place], [address] Phone: [phone]   Fax:  [phone]  Name: Jessica Harrell MRN: [id number] Date of Birth: 1941-09-23

## 2019-10-21 LAB — CUP PACEART REMOTE DEVICE CHECK
Date Time Interrogation Session: 20201114152917
Implantable Pulse Generator Implant Date: 20200903

## 2019-10-23 ENCOUNTER — Encounter (HOSPITAL_COMMUNITY): Payer: Self-pay | Admitting: Physical Therapy

## 2019-10-23 ENCOUNTER — Ambulatory Visit (INDEPENDENT_AMBULATORY_CARE_PROVIDER_SITE_OTHER): Payer: Medicare Other | Admitting: *Deleted

## 2019-10-23 ENCOUNTER — Ambulatory Visit (HOSPITAL_COMMUNITY): Payer: Medicare Other | Admitting: Physical Therapy

## 2019-10-23 ENCOUNTER — Other Ambulatory Visit: Payer: Self-pay

## 2019-10-23 DIAGNOSIS — R2689 Other abnormalities of gait and mobility: Secondary | ICD-10-CM

## 2019-10-23 DIAGNOSIS — R29818 Other symptoms and signs involving the nervous system: Secondary | ICD-10-CM

## 2019-10-23 DIAGNOSIS — R278 Other lack of coordination: Secondary | ICD-10-CM

## 2019-10-23 DIAGNOSIS — I639 Cerebral infarction, unspecified: Secondary | ICD-10-CM

## 2019-10-23 NOTE — Therapy (Signed)
[facility] [place], [place], [address] Phone: [phone]   Fax:  [phone]  Physical Therapy Treatment  Patient Details  Name: Jessica Harrell MRN: [id number] Date of Birth: [birth year] Referring Provider (PT): Kathryne Eriksson MD   Encounter Date: 10/23/2019  PT End of Session - 10/23/19 1217    Visit Number  21    Number of Visits  34    Date for PT Re-Evaluation  11/24/19    Authorization Type  [facility] Medicare Visits based on Medical Necessity    Authorization Time Period  08/30/19 - 10/30/19; 10/20/19-11/24/19    Authorization - Visit Number  21    Authorization - Number of Visits  30    PT Start Time  1120    PT Stop Time  [id number]    PT Time Calculation (min)  45 min    Equipment Utilized During Treatment  Gait belt;Other (comment)   Rolling walker   Activity Tolerance  Patient tolerated treatment well    Behavior During Therapy  WFL for tasks assessed/performed       Past Medical History:  Diagnosis Date  . Breast cancer ([place])   . Right foot pain Nov. 2014    Past Surgical History:  Procedure Laterality Date  . BREAST BIOPSY    . BUBBLE STUDY  05/18/2019   Procedure: BUBBLE STUDY;  Surgeon: Elouise Munroe, MD;  Location: [facility] ENDOSCOPY;  Service: Cardiology;;  . CATARACT EXTRACTION     both eyes  . EYE SURGERY Left   . MASTECTOMY W/ SENTINEL NODE BIOPSY Left 05/09/2019   Procedure: LEFT MASTECTOMY WITH LEFT AXILLARY SENTINEL LYMPH NODE BIOPSY;  Surgeon: Coralie Keens, MD;  Location: [place];  Service: General;  Laterality: Left;  . TEE WITHOUT CARDIOVERSION N/A 05/18/2019   Procedure: TRANSESOPHAGEAL ECHOCARDIOGRAM (TEE);  Surgeon: Elouise Munroe, MD;  Location: [place];  Service: Cardiology;  Laterality: N/A;    There were no vitals filed for this visit.  Subjective Assessment - 10/23/19 1125    Subjective  Patient says she fell off the bed on Saturday. Patient says was sitting on the EOB when  she began to fall forward. Patients husband says this is similar in manner to how patient fell several weeks ago. Patient husband says he had to help her from floor. Patient and husband deny any injuries.    Patient is accompained by:  Family member    Pertinent History  CVA 05/14/19    Limitations  House hold activities;Standing;Walking    How long can you stand comfortably?  Requires assistance and support of walker    How long can you walk comfortably?  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  No/denies                       [facility] Adult PT Treatment/Exercise - 10/23/19 0001      Ambulation/Gait   Gait Comments  Gait training 40 feet x 2, with RW, Min A, Max verbal cueing for turns; Mod cueing for weight shift and sequencing of BLE      Knee/Hip Exercises: Aerobic   Recumbent Bike  8 minutes, level 4      Knee/Hip Exercises: Seated   Sit to [employer]  2 sets;5 reps;with UE support   Min A, verbal cueing for hand placment          Balance Exercises - 10/23/19 1227      Balance Exercises: Standing   Marching Limitations  HHA x 2 with RW; both; x15    Other Standing Exercises  standing weighshifts; staggered stance using RW; both; x20 each; Min A for balance          PT Short Term Goals - 10/20/19 1123      PT SHORT TERM GOAL #1   Title  Patient will be educated on HEP and report regular compliance to improve safety and independence with functional mobility.    Time  4    Period  Weeks    Status  Achieved    Target Date  09/27/19        PT Long Term Goals - 10/20/19 1123      PT LONG TERM GOAL #1   Title  Patient will demonstrate ability to perform sit to stand transfer with modified independence to improve overall functional mobility.    Baseline   09/22/19- Min A, cueing for forward momentum 10/20/19: min A with verbal cueing to push off WC and shift hips anteriorly    Time  5    Period  Weeks    Status  On-going      PT LONG TERM GOAL #2   Title  Patient will demonstrate ability to ambulate 100 feet with LRAD with no more than minimal assistance and minimal tactile and verbal cues for form.    Baseline  1-/16/20- able to ambulate 49' and 16' with rest break, using RW on level surface with Min A and frequent cueing for BLE sequencing, and keeping BOS within RW. 10/20/19: min to mod assist    Time  5    Period  Weeks    Status  On-going      PT LONG TERM GOAL #3   Title  Patient's caregivers/family will demonstrate ability to transfer and ambulate with patient with proper body mechanics and techniques for proper exercise at home.    Time  5    Period  Weeks    Status  New      PT LONG TERM GOAL #4   Title  Patient will demonstrate ability to correct standing posture and maintain standing balance for at least 1 minute with no more than minimal verbal cueing to improve independence with functional mobility.    Baseline  Able to do, but needs frequent cueing to avoid left leaning    Time  5    Period  Weeks    Status  On-going            Plan - 10/23/19 1217    Clinical Impression Statement  Patient demos improvement with return for weight shifting during ambualtion this session. Patient did have difficulty with forward momentum for sit to stands, but was able to improve with verbal cueing and Min A. Patient able to ambulate 40 feet x 2 in clinic using RW, and Min A, but required therapist correct for LOB x 3. Patient has improved with return for sequencing forward walking, but had increased difficulty this session with navigating turns and minor obstacles. Patient required Max verbal and min tactile cueing for sequencing of BLEs during turns using RW. Patient noted slight fatigue at end of session.    Personal Factors and  Comorbidities  Age;Comorbidity 1    Comorbidities  Hx  Breast Cancer, S/P CVA    Examination-Activity Limitations  Bathing;Hygiene/Grooming;Squat;Stairs;Bend;Lift;Locomotion Level;Stand;Transfers;Dressing    Examination-Participation Restrictions  Cleaning;Shop;Laundry;Community Activity;Meal Prep    Stability/Clinical Decision Making  Evolving/Moderate complexity    Rehab Potential  Good    PT Frequency  2x / week    PT Duration  Other (comment)   5 weeks   PT Treatment/Interventions  ADLs/Self Care Home Management;Aquatic Therapy;Cryotherapy;Electrical Stimulation;Moist Heat;DME Instruction;Gait training;Stair training;Functional mobility training;Therapeutic activities;Therapeutic exercise;Balance training;Neuromuscular re-education;Patient/family education;Manual techniques;Passive range of motion;Dry needling;Energy conservation;Taping    PT Next Visit Plan  Progress sequencing with gait as able, add obstracle navigation (focus on turning) if able    PT Home Exercise Plan  9/28: seated stepping towards sticky note targets for coordination; 10/12: gait training 3x/day before/after sleep depending on fatigue, heel raises and mini squats at countertop for UE support with family at side; 10/19: lateral scooting while seated EOB from HOB<>foot of bed    Consulted and Agree with Plan of Care  Patient;Family member/caregiver    Family Member Consulted  spouse [name]), daughter ([place])       Patient will benefit from skilled therapeutic intervention in order to improve the following deficits and impairments:  Abnormal gait, Impaired sensation, Improper body mechanics, Decreased coordination, Decreased mobility, Postural dysfunction, Decreased activity tolerance, Decreased endurance, Decreased strength, Decreased balance, Decreased safety awareness, Difficulty walking, Impaired vision/preception  Visit Diagnosis: Other symptoms and signs involving the nervous system  Other lack of  coordination  Other abnormalities of gait and mobility     Problem List Patient Active Problem List   Diagnosis Date Noted  . Acute ischemic stroke ([place]) 05/15/2019  . Acute metabolic encephalopathy [id number]  . Encephalopathy   . History of left breast cancer 05/09/2019  . Malignant neoplasm of lower-inner quadrant of left breast in female, estrogen receptor positive ([place]) 04/26/2018   12:29 PM, 10/23/19 Josue Hector PT DPT  Physical Therapist with [facility]  [phone] [place], [place], [address] Phone: [phone]   Fax:  [phone]  Name: Jessica Harrell MRN: [id number] Date of Birth: [birth year]

## 2019-10-25 ENCOUNTER — Ambulatory Visit (HOSPITAL_COMMUNITY): Payer: Medicare Other | Admitting: Physical Therapy

## 2019-10-25 ENCOUNTER — Encounter (HOSPITAL_COMMUNITY): Payer: Self-pay | Admitting: Physical Therapy

## 2019-10-25 ENCOUNTER — Other Ambulatory Visit: Payer: Self-pay

## 2019-10-25 DIAGNOSIS — R29818 Other symptoms and signs involving the nervous system: Secondary | ICD-10-CM

## 2019-10-25 DIAGNOSIS — R2689 Other abnormalities of gait and mobility: Secondary | ICD-10-CM

## 2019-10-25 DIAGNOSIS — R278 Other lack of coordination: Secondary | ICD-10-CM

## 2019-10-25 NOTE — Therapy (Signed)
[place], [place], [address] Phone: [phone]   Fax:  [phone]  Physical Therapy Treatment  Patient Details  Name: Jessica Harrell MRN: [id number] Date of Birth: 31-May-1941 Referring Provider (PT): Kathryne Eriksson MD   Encounter Date: 10/25/2019  PT End of Session - 10/25/19 1024    Visit Number  22    Number of Visits  34    Date for PT Re-Evaluation  11/24/19    Authorization Type  [facility] Medicare Visits based on Medical Necessity    Authorization Time Period  08/30/19 - 10/30/19; 10/20/19-11/24/19    Authorization - Visit Number  22    Authorization - Number of Visits  30    PT Start Time  1024    PT Stop Time  1106    PT Time Calculation (min)  42 min    Equipment Utilized During Treatment  Gait belt;Other (comment)   Rolling walker   Activity Tolerance  Patient tolerated treatment well    Behavior During Therapy  WFL for tasks assessed/performed       Past Medical History:  Diagnosis Date  . Breast cancer ([place])   . Right foot pain Nov. 2014    Past Surgical History:  Procedure Laterality Date  . BREAST BIOPSY    . BUBBLE STUDY  05/18/2019   Procedure: BUBBLE STUDY;  Surgeon: Elouise Munroe, MD;  Location: [facility] ENDOSCOPY;  Service: Cardiology;;  . CATARACT EXTRACTION     both eyes  . EYE SURGERY Left   . MASTECTOMY W/ SENTINEL NODE BIOPSY Left 05/09/2019   Procedure: LEFT MASTECTOMY WITH LEFT AXILLARY SENTINEL LYMPH NODE BIOPSY;  Surgeon: Coralie Keens, MD;  Location: [place];  Service: General;  Laterality: Left;  . TEE WITHOUT CARDIOVERSION N/A 05/18/2019   Procedure: TRANSESOPHAGEAL ECHOCARDIOGRAM (TEE);  Surgeon: Elouise Munroe, MD;  Location: [place];  Service: Cardiology;  Laterality: N/A;    There were no vitals filed for this visit.  Subjective Assessment - 10/25/19 1024    Subjective  Patient's husband says there hasn't been any recent falls since last weekend. There has  not been any new issues at this time. Patient states she's feeling good.    Patient is accompained by:  Family member    Pertinent History  CVA 05/14/19    Limitations  House hold activities;Standing;Walking    How long can you stand comfortably?  Requires assistance and support of walker    How long can you walk comfortably?  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  No/denies                       [facility] Adult PT Treatment/Exercise - 10/25/19 0001      Ambulation/Gait   Gait Comments  gait training with HHA and min/mod assist for balance and provided tactile cueing for weight shifting provided by therapist ambulating to/from recumbent bike x 30 feet; gait with RW, therapist encouraging standing up tall, decreasing posterior lean, weight shifting, equal bil step length, and weight shift anteriorly therapist providing moderate assistance with verbal cues for sequence of BLE 4x75'      Knee/Hip Exercises: Aerobic   Recumbent Bike  8 minutes, level 4  Knee/Hip Exercises: Standing   Other Standing Knee Exercises  standing weight shifts with verbal and tactile cueing, marching with mod assist for balance      Knee/Hip Exercises: Seated   Sit to Sand  2 sets;5 reps;with UE support   with min/mod assist and frequent verbal and tactile cueing            PT Education - 10/25/19 1035    Education Details  Educated on purpose and technique of interventions throughout session and importance of HEP    Person(s) Educated  Patient    Methods  Explanation    Comprehension  Verbalized understanding       PT Short Term Goals - 10/20/19 1123      PT SHORT TERM GOAL #1   Title  Patient will be educated on HEP and report regular compliance to improve safety and  independence with functional mobility.    Time  4    Period  Weeks    Status  Achieved    Target Date  09/27/19        PT Long Term Goals - 10/20/19 1123      PT LONG TERM GOAL #1   Title  Patient will demonstrate ability to perform sit to stand transfer with modified independence to improve overall functional mobility.    Baseline  09/22/19- Min A, cueing for forward momentum 10/20/19: min A with verbal cueing to push off WC and shift hips anteriorly    Time  5    Period  Weeks    Status  On-going      PT LONG TERM GOAL #2   Title  Patient will demonstrate ability to ambulate 100 feet with LRAD with no more than minimal assistance and minimal tactile and verbal cues for form.    Baseline  1-/16/20- able to ambulate 20' and 61' with rest break, using RW on level surface with Min A and frequent cueing for BLE sequencing, and keeping BOS within RW. 10/20/19: min to mod assist    Time  5    Period  Weeks    Status  On-going      PT LONG TERM GOAL #3   Title  Patient's caregivers/family will demonstrate ability to transfer and ambulate with patient with proper body mechanics and techniques for proper exercise at home.    Time  5    Period  Weeks    Status  New      PT LONG TERM GOAL #4   Title  Patient will demonstrate ability to correct standing posture and maintain standing balance for at least 1 minute with no more than minimal verbal cueing to improve independence with functional mobility.    Baseline  Able to do, but needs frequent cueing to avoid left leaning    Time  5    Period  Weeks    Status  On-going            Plan - 10/25/19 1025    Clinical Impression Statement  Patient able to ambulate further distance today but continues to fatigue quickly secondary impaired activity tolerance. She ambulates frequently with step to pattern with right LE when she fatigues. She demonstrates improving step through gait pattern with RW and min/mod assist but continues to require  moderate verbal cueing for step length, staying within walker, and standing up tall. She demonstrates improving ability to sit to stand but continues to require min verbal cueing for using UE on wheel chair. Patient  will continue to benefit from skilled physical therapy in order to reduce impairment and improve function.    Personal Factors and Comorbidities  Age;Comorbidity 1    Comorbidities  Hx Breast Cancer, S/P CVA    Examination-Activity Limitations  Bathing;Hygiene/Grooming;Squat;Stairs;Bend;Lift;Locomotion Level;Stand;Transfers;Dressing    Examination-Participation Restrictions  Cleaning;Shop;Laundry;Community Activity;Meal Prep    Stability/Clinical Decision Making  Evolving/Moderate complexity    Rehab Potential  Good    PT Frequency  2x / week    PT Duration  Other (comment)   5 weeks   PT Treatment/Interventions  ADLs/Self Care Home Management;Aquatic Therapy;Cryotherapy;Electrical Stimulation;Moist Heat;DME Instruction;Gait training;Stair training;Functional mobility training;Therapeutic activities;Therapeutic exercise;Balance training;Neuromuscular re-education;Patient/family education;Manual techniques;Passive range of motion;Dry needling;Energy conservation;Taping    PT Next Visit Plan  Progress sequencing with gait as able, add obstracle navigation (focus on turning) if able    PT Home Exercise Plan  9/28: seated stepping towards sticky note targets for coordination; 10/12: gait training 3x/day before/after sleep depending on fatigue, heel raises and mini squats at countertop for UE support with family at side; 10/19: lateral scooting while seated EOB from HOB<>foot of bed    Consulted and Agree with Plan of Care  Patient;Family member/caregiver    Family Member Consulted  spouse [name]), daughter ([place])       Patient will benefit from skilled therapeutic intervention in order to improve the following deficits and impairments:  Abnormal gait, Impaired sensation, Improper body  mechanics, Decreased coordination, Decreased mobility, Postural dysfunction, Decreased activity tolerance, Decreased endurance, Decreased strength, Decreased balance, Decreased safety awareness, Difficulty walking, Impaired vision/preception  Visit Diagnosis: Other symptoms and signs involving the nervous system  Other lack of coordination  Other abnormalities of gait and mobility     Problem List Patient Active Problem List   Diagnosis Date Noted  . Acute ischemic stroke ([place]) 05/15/2019  . Acute metabolic encephalopathy [id number]  . Encephalopathy   . History of left breast cancer 05/09/2019  . Malignant neoplasm of lower-inner quadrant of left breast in female, estrogen receptor positive ([place]) 04/26/2018    11:39 AM, 10/25/19 Mearl Latin PT, DPT Physical Therapist at [address] [place], [place], [address] Phone: [phone]   Fax:  [phone]  Name: Jessica Harrell MRN: [id number] Date of Birth: Nov 20, 1941

## 2019-10-27 ENCOUNTER — Ambulatory Visit (HOSPITAL_COMMUNITY): Payer: Medicare Other | Admitting: Physical Therapy

## 2019-10-31 ENCOUNTER — Ambulatory Visit (HOSPITAL_COMMUNITY): Payer: Medicare Other | Admitting: Physical Therapy

## 2019-10-31 ENCOUNTER — Encounter (HOSPITAL_COMMUNITY): Payer: Self-pay | Admitting: Physical Therapy

## 2019-10-31 ENCOUNTER — Other Ambulatory Visit: Payer: Self-pay

## 2019-10-31 DIAGNOSIS — R29818 Other symptoms and signs involving the nervous system: Secondary | ICD-10-CM | POA: Diagnosis not present

## 2019-10-31 DIAGNOSIS — R2689 Other abnormalities of gait and mobility: Secondary | ICD-10-CM

## 2019-10-31 DIAGNOSIS — R278 Other lack of coordination: Secondary | ICD-10-CM

## 2019-10-31 NOTE — Therapy (Signed)
[place], [place], [address] Phone: [phone]   Fax:  [phone]  Physical Therapy Treatment  Patient Details  Name: Jessica Harrell MRN: [id number] Date of Birth: [birth year] Referring Provider (PT): Kathryne Eriksson MD   Encounter Date: 10/31/2019  PT End of Session - 10/31/19 1042    Visit Number  23    Number of Visits  34    Date for PT Re-Evaluation  11/24/19    Authorization Type  [facility] Medicare Visits based on Medical Necessity    Authorization Time Period  08/30/19 - 10/30/19; 10/20/19-11/24/19    Authorization - Visit Number  23    Authorization - Number of Visits  30    PT Start Time  1034    PT Stop Time  1115    PT Time Calculation (min)  41 min    Equipment Utilized During Treatment  Gait belt;Other (comment)   Rolling walker   Activity Tolerance  Patient tolerated treatment well    Behavior During Therapy  WFL for tasks assessed/performed       Past Medical History:  Diagnosis Date  . Breast cancer ([place])   . Right foot pain Nov. 2014    Past Surgical History:  Procedure Laterality Date  . BREAST BIOPSY    . BUBBLE STUDY  05/18/2019   Procedure: BUBBLE STUDY;  Surgeon: Elouise Munroe, MD;  Location: [facility] ENDOSCOPY;  Service: Cardiology;;  . CATARACT EXTRACTION     both eyes  . EYE SURGERY Left   . MASTECTOMY W/ SENTINEL NODE BIOPSY Left 05/09/2019   Procedure: LEFT MASTECTOMY WITH LEFT AXILLARY SENTINEL LYMPH NODE BIOPSY;  Surgeon: Coralie Keens, MD;  Location: [place];  Service: General;  Laterality: Left;  . TEE WITHOUT CARDIOVERSION N/A 05/18/2019   Procedure: TRANSESOPHAGEAL ECHOCARDIOGRAM (TEE);  Surgeon: Elouise Munroe, MD;  Location: [place];  Service: Cardiology;  Laterality: N/A;    There were no vitals filed for this visit.  Subjective Assessment - 10/31/19 1039    Subjective  Patients husband and daughter say that this week has been a good week. They deny any  recent falls and patients husband says he noticed improvement in patients ability to stand from bed at home. Patients daughter says she has been working more with patient on cueing body position with sit to stand and walking which she says has been helping a lot.    Patient is accompained by:  Family member    Pertinent History  CVA 05/14/19    Limitations  House hold activities;Standing;Walking    How long can you stand comfortably?  Requires assistance and support of walker    How long can you walk comfortably?  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  No/denies                       [facility] Adult PT Treatment/Exercise - 10/31/19 0001      Ambulation/Gait   Gait Comments  Gait training 50 feet x 2, with RW, Min A, Max verbal cueing for turns; Min cueing for weight shift and sequencing of BLE      Knee/Hip Exercises: Aerobic   Recumbent Bike  8 minutes, level 3  Knee/Hip Exercises: Seated   Sit to Sand  2 sets;5 reps;with UE support          Balance Exercises - 10/31/19 1218      Balance Exercises: Standing   Step Over Hurdles / Cones  Figure 8 through cones (4 cones) x 2 RT; Max verbal cues for foot placement; Mod A for LOB corrections    Other Standing Exercises  standing weighshifts; staggered stance using RW; both; x20 each; Min A for balance          PT Short Term Goals - 10/20/19 1123      PT SHORT TERM GOAL #1   Title  Patient will be educated on HEP and report regular compliance to improve safety and independence with functional mobility.    Time  4    Period  Weeks    Status  Achieved    Target Date  09/27/19        PT Long Term Goals - 10/20/19 1123      PT LONG TERM GOAL #1   Title  Patient will demonstrate ability to perform sit  to stand transfer with modified independence to improve overall functional mobility.    Baseline  09/22/19- Min A, cueing for forward momentum 10/20/19: min A with verbal cueing to push off WC and shift hips anteriorly    Time  5    Period  Weeks    Status  On-going      PT LONG TERM GOAL #2   Title  Patient will demonstrate ability to ambulate 100 feet with LRAD with no more than minimal assistance and minimal tactile and verbal cues for form.    Baseline  1-/16/20- able to ambulate 85' and 62' with rest break, using RW on level surface with Min A and frequent cueing for BLE sequencing, and keeping BOS within RW. 10/20/19: min to mod assist    Time  5    Period  Weeks    Status  On-going      PT LONG TERM GOAL #3   Title  Patient's caregivers/family will demonstrate ability to transfer and ambulate with patient with proper body mechanics and techniques for proper exercise at home.    Time  5    Period  Weeks    Status  New      PT LONG TERM GOAL #4   Title  Patient will demonstrate ability to correct standing posture and maintain standing balance for at least 1 minute with no more than minimal verbal cueing to improve independence with functional mobility.    Baseline  Able to do, but needs frequent cueing to avoid left leaning    Time  5    Period  Weeks    Status  On-going            Plan - 10/31/19 1211    Clinical Impression Statement  Patient shows improved return of cueing for hand placement and sequencing of sit to stand transfers. Patient also shows improved return ith weigth shifting during ambulation, though does continue to require min A and tactile cueing to improve limb advance. Patient was well challenged with addition of cone obstacles for navigating turns. Patient required max verbal and tactile cueing for proper sequencing of feet to navigate turns and showed increased tendency to lean leftward as previously seen with linear ambulation. Patient required therapist  assist for correction of LOB several times during turn navigation.    Personal Factors and Comorbidities  Age;Comorbidity  1    Comorbidities  Hx Breast Cancer, S/P CVA    Examination-Activity Limitations  Bathing;Hygiene/Grooming;Squat;Stairs;Bend;Lift;Locomotion Level;Stand;Transfers;Dressing    Examination-Participation Restrictions  Cleaning;Shop;Laundry;Community Activity;Meal Prep    Stability/Clinical Decision Making  Evolving/Moderate complexity    Rehab Potential  Good    PT Frequency  2x / week    PT Duration  Other (comment)   5 weeks   PT Treatment/Interventions  ADLs/Self Care Home Management;Aquatic Therapy;Cryotherapy;Electrical Stimulation;Moist Heat;DME Instruction;Gait training;Stair training;Functional mobility training;Therapeutic activities;Therapeutic exercise;Balance training;Neuromuscular re-education;Patient/family education;Manual techniques;Passive range of motion;Dry needling;Energy conservation;Taping    PT Next Visit Plan  Continue to progress ambulation and focus on turning    PT Home Exercise Plan  9/28: seated stepping towards sticky note targets for coordination; 10/12: gait training 3x/day before/after sleep depending on fatigue, heel raises and mini squats at countertop for UE support with family at side; 10/19: lateral scooting while seated EOB from HOB<>foot of bed    Consulted and Agree with Plan of Care  Patient;Family member/caregiver    Family Member Consulted  spouse [name]), daughter ([place])       Patient will benefit from skilled therapeutic intervention in order to improve the following deficits and impairments:  Abnormal gait, Impaired sensation, Improper body mechanics, Decreased coordination, Decreased mobility, Postural dysfunction, Decreased activity tolerance, Decreased endurance, Decreased strength, Decreased balance, Decreased safety awareness, Difficulty walking, Impaired vision/preception  Visit Diagnosis: Other symptoms and signs  involving the nervous system  Other lack of coordination  Other abnormalities of gait and mobility     Problem List Patient Active Problem List   Diagnosis Date Noted  . Acute ischemic stroke ([place]) 05/15/2019  . Acute metabolic encephalopathy [id number]  . Encephalopathy   . History of left breast cancer 05/09/2019  . Malignant neoplasm of lower-inner quadrant of left breast in female, estrogen receptor positive ([place]) 04/26/2018   12:21 PM, 10/31/19 Josue Hector PT DPT  Physical Therapist with [facility]  [phone] [place], [place], [address] Phone: [phone]   Fax:  [phone]  Name: Jessica Harrell MRN: [id number] Date of Birth: 1940/12/09

## 2019-11-07 ENCOUNTER — Other Ambulatory Visit: Payer: Self-pay

## 2019-11-07 ENCOUNTER — Encounter (HOSPITAL_COMMUNITY): Payer: Self-pay

## 2019-11-07 ENCOUNTER — Ambulatory Visit (HOSPITAL_COMMUNITY): Payer: Medicare Other | Attending: Family Medicine

## 2019-11-07 DIAGNOSIS — R2689 Other abnormalities of gait and mobility: Secondary | ICD-10-CM | POA: Diagnosis present

## 2019-11-07 DIAGNOSIS — R278 Other lack of coordination: Secondary | ICD-10-CM | POA: Diagnosis present

## 2019-11-07 DIAGNOSIS — R29898 Other symptoms and signs involving the musculoskeletal system: Secondary | ICD-10-CM | POA: Insufficient documentation

## 2019-11-07 DIAGNOSIS — R29818 Other symptoms and signs involving the nervous system: Secondary | ICD-10-CM | POA: Insufficient documentation

## 2019-11-07 NOTE — Therapy (Signed)
[place], [place], [address] Phone: [phone]   Fax:  [phone]  Physical Therapy Treatment  Patient Details  Name: Jessica Harrell MRN: [id number] Date of Birth: Dec 25, 1940 Referring Provider (PT): Kathryne Eriksson MD   Encounter Date: 11/07/2019  PT End of Session - 11/07/19 1212    Visit Number  24    Number of Visits  34    Date for PT Re-Evaluation  11/24/19    Authorization Type  [facility] Medicare Visits based on Medical Necessity    Authorization Time Period  08/30/19 - 10/30/19; 10/20/19-11/24/19    Authorization - Visit Number  24    Authorization - Number of Visits  30    PT Start Time  1119    PT Stop Time  [id number]    PT Time Calculation (min)  46 min    Equipment Utilized During Treatment  Gait belt;Other (comment)   Rolling walker   Activity Tolerance  Patient tolerated treatment well    Behavior During Therapy  WFL for tasks assessed/performed       Past Medical History:  Diagnosis Date  . Breast cancer ([place])   . Right foot pain Nov. 2014    Past Surgical History:  Procedure Laterality Date  . BREAST BIOPSY    . BUBBLE STUDY  05/18/2019   Procedure: BUBBLE STUDY;  Surgeon: Elouise Munroe, MD;  Location: [facility] ENDOSCOPY;  Service: Cardiology;;  . CATARACT EXTRACTION     both eyes  . EYE SURGERY Left   . MASTECTOMY W/ SENTINEL NODE BIOPSY Left 05/09/2019   Procedure: LEFT MASTECTOMY WITH LEFT AXILLARY SENTINEL LYMPH NODE BIOPSY;  Surgeon: Coralie Keens, MD;  Location: [facility];  Service: General;  Laterality: Left;  . TEE WITHOUT CARDIOVERSION N/A 05/18/2019   Procedure: TRANSESOPHAGEAL ECHOCARDIOGRAM (TEE);  Surgeon: Elouise Munroe, MD;  Location: [facility];  Service: Cardiology;  Laterality: N/A;    There were no vitals filed for this visit.  Subjective Assessment - 11/07/19 1123    Subjective  Pt reports she feels "wonderful". Pt's daughter reports they got her a sitting, stationary  bike for christmas. Pt's spouse reports pt fell out of bed Friday night, but didn't hit her head or hurt anything.    Patient is accompained by:  Family member    Pertinent History  CVA 05/14/19    Limitations  House hold activities;Standing;Walking    How long can you stand comfortably?  Requires assistance and support of walker    How long can you walk comfortably?  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  No/denies               [facility] Adult PT Treatment/Exercise - 11/07/19 0001      Transfers   Transfers  Sit to Stand    Comments  STS reps from elevated mat, varying assistance from contact guard to mod A with BLE bracing on mat table, contstant verbal cues for "nose over toes" and mod A once standing to decrease posterior lean into heels; verbal cues for controlled lowering to prevent uncontrolled returned to sitting but continues to require BLE bracing on seated surface and occasional min A      Ambulation/Gait   Gait Comments  gait  training, 50 ft x2 reps with RW, mod-max A for trunk uprighting to prevent loss of balance backwards, mod A with RW management; sidestepping at blue mat, x4RT, mod A to prevent loss of balance laterally, bil HHA      Knee/Hip Exercises: Standing   Gait Training  Gait trainer, 0.69mph-0.5mph, x2 min, BUE support on treadmill and contact guard for balance; min assist to get on/off treadmill with constant verbal cues for hand placement and sequencing step up/down             PT Education - 11/07/19 1211    Education Details  Educated on treadmill training for gait, verbal cues for transfers and gait, and technique for interventions    Person(s) Educated  Patient;Spouse;Child(ren)    Methods  Explanation;Demonstration;Tactile cues;Verbal cues     Comprehension  Verbalized understanding;Verbal cues required;Tactile cues required       PT Short Term Goals - 10/20/19 1123      PT SHORT TERM GOAL #1   Title  Patient will be educated on HEP and report regular compliance to improve safety and independence with functional mobility.    Time  4    Period  Weeks    Status  Achieved    Target Date  09/27/19        PT Long Term Goals - 10/20/19 1123      PT LONG TERM GOAL #1   Title  Patient will demonstrate ability to perform sit to stand transfer with modified independence to improve overall functional mobility.    Baseline  09/22/19- Min A, cueing for forward momentum 10/20/19: min A with verbal cueing to push off WC and shift hips anteriorly    Time  5    Period  Weeks    Status  On-going      PT LONG TERM GOAL #2   Title  Patient will demonstrate ability to ambulate 100 feet with LRAD with no more than minimal assistance and minimal tactile and verbal cues for form.    Baseline  1-/16/20- able to ambulate 1' and 18' with rest break, using RW on level surface with Min A and frequent cueing for BLE sequencing, and keeping BOS within RW. 10/20/19: min to mod assist    Time  5    Period  Weeks    Status  On-going      PT LONG TERM GOAL #3   Title  Patient's caregivers/family will demonstrate ability to transfer and ambulate with patient with proper body mechanics and techniques for proper exercise at home.    Time  5    Period  Weeks    Status  New      PT LONG TERM GOAL #4   Title  Patient will demonstrate ability to correct standing posture and maintain standing balance for at least 1 minute with no more than minimal verbal cueing to improve independence with functional mobility.    Baseline  Able to do, but needs frequent cueing to avoid left leaning    Time  5    Period  Weeks    Status  On-going            Plan - 11/07/19 1217    Clinical Impression Statement  Continued with POC with focus on gait training,  proprioception during gait and appopriate weight shifting in gait this session. Pt continues to demonstrate ataxia with weight shifting during lateral and forward stepping. Pt with increased difficulty with R foot step progress laterally  and with retro gait demonstrating R foot circling motion due to coordination deficits with step progression. Pt requires mod-max assist with straightline gait and turning to R and L with mod assist with RW management to maintain body within frame.  Pt demonstrates severe posterior trunk lean with ambulation that improves with RW pushed further in front of pt, but unable to maintain and returns to posterior trunk lean requiring mod-max assist to prevent falls backwards. Tactile cues through bil hips to increase weight bearing down, assisting in weight shifting laterally to allow opposite foot to step, and tactile cues at R leg to assist in increasing R step length. Added gait trainer this date, pt able to maintain trunk over feet and slightly anterior, but continues to demonstrate L step greater than R and CGA for overall balance on gait trainer. Continue to progress gait trainer to improve trunk position over BOS with ambulation.    Personal Factors and Comorbidities  Age;Comorbidity 1    Comorbidities  Hx Breast Cancer, S/P CVA    Examination-Activity Limitations  Bathing;Hygiene/Grooming;Squat;Stairs;Bend;Lift;Locomotion Level;Stand;Transfers;Dressing    Examination-Participation Restrictions  Cleaning;Shop;Laundry;Community Activity;Meal Prep    Stability/Clinical Decision Making  Evolving/Moderate complexity    Rehab Potential  Good    PT Frequency  2x / week    PT Duration  Other (comment)   5 weeks   PT Treatment/Interventions  ADLs/Self Care Home Management;Aquatic Therapy;Cryotherapy;Electrical Stimulation;Moist Heat;DME Instruction;Gait training;Stair training;Functional mobility training;Therapeutic activities;Therapeutic exercise;Balance training;Neuromuscular  re-education;Patient/family education;Manual techniques;Passive range of motion;Dry needling;Energy conservation;Taping    PT Next Visit Plan  Continue to progress ambulation and focus on turning. Gait trainer with visual cues for equal step length or auditory beeps to improve equal step length.    PT Home Exercise Plan  9/28: seated stepping towards sticky note targets for coordination; 10/12: gait training 3x/day before/after sleep depending on fatigue, heel raises and mini squats at countertop for UE support with family at side; 10/19: lateral scooting while seated EOB from HOB<>foot of bed    Consulted and Agree with Plan of Care  Patient;Family member/caregiver    Family Member Consulted  spouse [name]), daughter ([place])       Patient will benefit from skilled therapeutic intervention in order to improve the following deficits and impairments:  Abnormal gait, Impaired sensation, Improper body mechanics, Decreased coordination, Decreased mobility, Postural dysfunction, Decreased activity tolerance, Decreased endurance, Decreased strength, Decreased balance, Decreased safety awareness, Difficulty walking, Impaired vision/preception  Visit Diagnosis: Other symptoms and signs involving the nervous system  Other lack of coordination  Other abnormalities of gait and mobility     Problem List Patient Active Problem List   Diagnosis Date Noted  . Acute ischemic stroke ([place]) 05/15/2019  . Acute metabolic encephalopathy [id number]  . Encephalopathy   . History of left breast cancer 05/09/2019  . Malignant neoplasm of lower-inner quadrant of left breast in female, estrogen receptor positive ([place]) 04/26/2018     Talbot Grumbling PT, DPT 11/07/19, 12:25 PM [place], [place], [address] Phone: [phone]   Fax:  [phone]  Name: Jessica Harrell MRN: [id number] Date of Birth: 1941/01/07

## 2019-11-10 ENCOUNTER — Ambulatory Visit (HOSPITAL_COMMUNITY): Payer: Medicare Other | Admitting: Physical Therapy

## 2019-11-10 ENCOUNTER — Other Ambulatory Visit: Payer: Self-pay

## 2019-11-10 ENCOUNTER — Encounter (HOSPITAL_COMMUNITY): Payer: Self-pay | Admitting: Physical Therapy

## 2019-11-10 DIAGNOSIS — R278 Other lack of coordination: Secondary | ICD-10-CM

## 2019-11-10 DIAGNOSIS — R2689 Other abnormalities of gait and mobility: Secondary | ICD-10-CM

## 2019-11-10 DIAGNOSIS — R29818 Other symptoms and signs involving the nervous system: Secondary | ICD-10-CM

## 2019-11-10 NOTE — Therapy (Signed)
[place], [place], [address] Phone: [phone]   Fax:  [phone]  Physical Therapy Treatment  Patient Details  Name: Jessica Harrell MRN: [id number] Date of Birth: Mar 29, 1941 Referring Provider (PT): Kathryne Eriksson MD   Encounter Date: 11/10/2019  PT End of Session - 11/10/19 1115    Visit Number  25    Number of Visits  34    Date for PT Re-Evaluation  11/24/19    Authorization Type  [facility] Medicare Visits based on Medical Necessity    Authorization Time Period  08/30/19 - 10/30/19; 10/20/19-11/24/19    Authorization - Visit Number  25    Authorization - Number of Visits  30    PT Start Time  1108    PT Stop Time  [id number]    PT Time Calculation (min)  48 min    Equipment Utilized During Treatment  Gait belt;Other (comment)   Rolling walker   Activity Tolerance  Patient tolerated treatment well    Behavior During Therapy  WFL for tasks assessed/performed       Past Medical History:  Diagnosis Date  . Breast cancer ([place])   . Right foot pain Nov. 2014    Past Surgical History:  Procedure Laterality Date  . BREAST BIOPSY    . BUBBLE STUDY  05/18/2019   Procedure: BUBBLE STUDY;  Surgeon: Elouise Munroe, MD;  Location: [facility] ENDOSCOPY;  Service: Cardiology;;  . CATARACT EXTRACTION     both eyes  . EYE SURGERY Left   . MASTECTOMY W/ SENTINEL NODE BIOPSY Left 05/09/2019   Procedure: LEFT MASTECTOMY WITH LEFT AXILLARY SENTINEL LYMPH NODE BIOPSY;  Surgeon: Coralie Keens, MD;  Location: [facility];  Service: General;  Laterality: Left;  . TEE WITHOUT CARDIOVERSION N/A 05/18/2019   Procedure: TRANSESOPHAGEAL ECHOCARDIOGRAM (TEE);  Surgeon: Elouise Munroe, MD;  Location: [facility];  Service: Cardiology;  Laterality: N/A;    There were no vitals filed for this visit.  Subjective Assessment - 11/10/19 1250    Subjective  Patient denied any pain. Patient's daughter reported that when she provides cueing she  often pulls back on the left side if the patient is taking too large of steps. Discussed that patient does have difficulty with scooting at times at home.    Patient is accompained by:  Family member    Pertinent History  CVA 05/14/19    Limitations  House hold activities;Standing;Walking    How long can you stand comfortably?  Requires assistance and support of walker    How long can you walk comfortably?  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  No/denies                       [facility] Adult PT Treatment/Exercise - 11/10/19 0001      Transfers   Comments  STS from elevated mat table between each direction of sidestepping at mat. Sidestepping at mat x 4 therapist providing tactile cues and facilitation for weight shift at trunk on the opposite side of the direction patient is sidestepping and then tactile cues for which LE to step with and to take larger steps. Caregiver education on guarding and ambulating with patient, with therapist providing stand by assist  with daughter demonstrating ambulation with patient, slow cadence for 75 feet, therapist providing tips on tactile cueing.              PT Education - 11/10/19 1251    Education Details  Discussed cueing with ambulation including weight shifting.    Person(s) Educated  Patient;Child(ren)    Methods  Explanation;Demonstration;Verbal cues    Comprehension  Verbalized understanding;Returned demonstration       PT Short Term Goals - 10/20/19 1123      PT SHORT TERM GOAL #1   Title  Patient will be educated on HEP and report regular compliance to improve safety and independence with functional mobility.    Time  4    Period  Weeks    Status  Achieved    Target Date  09/27/19        PT Long Term Goals -  10/20/19 1123      PT LONG TERM GOAL #1   Title  Patient will demonstrate ability to perform sit to stand transfer with modified independence to improve overall functional mobility.    Baseline  09/22/19- Min A, cueing for forward momentum 10/20/19: min A with verbal cueing to push off WC and shift hips anteriorly    Time  5    Period  Weeks    Status  On-going      PT LONG TERM GOAL #2   Title  Patient will demonstrate ability to ambulate 100 feet with LRAD with no more than minimal assistance and minimal tactile and verbal cues for form.    Baseline  1-/16/20- able to ambulate 55' and 52' with rest break, using RW on level surface with Min A and frequent cueing for BLE sequencing, and keeping BOS within RW. 10/20/19: min to mod assist    Time  5    Period  Weeks    Status  On-going      PT LONG TERM GOAL #3   Title  Patient's caregivers/family will demonstrate ability to transfer and ambulate with patient with proper body mechanics and techniques for proper exercise at home.    Time  5    Period  Weeks    Status  New      PT LONG TERM GOAL #4   Title  Patient will demonstrate ability to correct standing posture and maintain standing balance for at least 1 minute with no more than minimal verbal cueing to improve independence with functional mobility.    Baseline  Able to do, but needs frequent cueing to avoid left leaning    Time  5    Period  Weeks    Status  On-going            Plan - 11/10/19 1301    Clinical Impression Statement  Focused this session on family education with gait training. Patient's daughter demonstrated performance of gait training with verbal cues and tactile cueing with therapist stand by assist and providing instruction on how to perform tactile cues to improve weight shift. Patient demonstrated ability to ambulate 75 feet this date. Plan next session to work on scooting as patient's family reported that is difficult at home at times.    Personal  Factors and Comorbidities  Age;Comorbidity 1    Comorbidities  Hx Breast Cancer, S/P CVA    Examination-Activity Limitations  Bathing;Hygiene/Grooming;Squat;Stairs;Bend;Lift;Locomotion Level;Stand;Transfers;Dressing    Examination-Participation Restrictions  Cleaning;Shop;Laundry;Community Activity;Meal Prep    Stability/Clinical Decision Making  Evolving/Moderate complexity    Rehab Potential  Good    PT Frequency  2x / week    PT Duration  Other (comment)   5 weeks   PT Treatment/Interventions  ADLs/Self Care Home Management;Aquatic Therapy;Cryotherapy;Electrical Stimulation;Moist Heat;DME Instruction;Gait training;Stair training;Functional mobility training;Therapeutic activities;Therapeutic exercise;Balance training;Neuromuscular re-education;Patient/family education;Manual techniques;Passive range of motion;Dry needling;Energy conservation;Taping    PT Next Visit Plan  Scooting practice next session. Continue to progress ambulation and focus on turning. Gait trainer with visual cues for equal step length or auditory beeps to improve equal step length.    PT Home Exercise Plan  9/28: seated stepping towards sticky note targets for coordination; 10/12: gait training 3x/day before/after sleep depending on fatigue, heel raises and mini squats at countertop for UE support with family at side; 10/19: lateral scooting while seated EOB from HOB<>foot of bed    Consulted and Agree with Plan of Care  Patient;Family member/caregiver    Family Member Consulted  spouse [name]), daughter ([place])       Patient will benefit from skilled therapeutic intervention in order to improve the following deficits and impairments:  Abnormal gait, Impaired sensation, Improper body mechanics, Decreased coordination, Decreased mobility, Postural dysfunction, Decreased activity tolerance, Decreased endurance, Decreased strength, Decreased balance, Decreased safety awareness, Difficulty walking, Impaired  vision/preception  Visit Diagnosis: Other symptoms and signs involving the nervous system  Other lack of coordination  Other abnormalities of gait and mobility     Problem List Patient Active Problem List   Diagnosis Date Noted  . Acute ischemic stroke ([place]) 05/15/2019  . Acute metabolic encephalopathy [id number]  . Encephalopathy   . History of left breast cancer 05/09/2019  . Malignant neoplasm of lower-inner quadrant of left breast in female, estrogen receptor positive ([place]) 04/26/2018   Clarene Critchley PT, DPT 1:05 PM, 11/10/19 [place], [place], [address] Phone: [phone]   Fax:  [phone]  Name: Jessica Harrell MRN: [id number] Date of Birth: 1941/11/06

## 2019-11-14 ENCOUNTER — Other Ambulatory Visit: Payer: Self-pay

## 2019-11-14 ENCOUNTER — Ambulatory Visit (HOSPITAL_COMMUNITY): Payer: Medicare Other | Admitting: Physical Therapy

## 2019-11-14 ENCOUNTER — Encounter (HOSPITAL_COMMUNITY): Payer: Self-pay | Admitting: Physical Therapy

## 2019-11-14 DIAGNOSIS — R29818 Other symptoms and signs involving the nervous system: Secondary | ICD-10-CM | POA: Diagnosis not present

## 2019-11-14 DIAGNOSIS — R2689 Other abnormalities of gait and mobility: Secondary | ICD-10-CM

## 2019-11-14 DIAGNOSIS — R278 Other lack of coordination: Secondary | ICD-10-CM

## 2019-11-14 NOTE — Therapy (Signed)
[place], [place], [address] Phone: [phone]   Fax:  [phone]  Physical Therapy Treatment  Patient Details  Name: Jessica Harrell MRN: [id number] Date of Birth: 12-24-1940 Referring Provider (PT): Kathryne Eriksson MD   Encounter Date: 11/14/2019  PT End of Session - 11/14/19 1507    Visit Number  26    Number of Visits  34    Date for PT Re-Evaluation  11/24/19    Authorization Type  [facility] Medicare Visits based on Medical Necessity    Authorization Time Period  08/30/19 - 10/30/19; 10/20/19-11/24/19    Authorization - Visit Number  26    Authorization - Number of Visits  30    PT Start Time  1120    PT Stop Time  1200    PT Time Calculation (min)  40 min    Equipment Utilized During Treatment  Gait belt;Other (comment)   Rolling walker   Activity Tolerance  Patient tolerated treatment well    Behavior During Therapy  WFL for tasks assessed/performed       Past Medical History:  Diagnosis Date  . Breast cancer ([place])   . Right foot pain Nov. 2014    Past Surgical History:  Procedure Laterality Date  . BREAST BIOPSY    . BUBBLE STUDY  05/18/2019   Procedure: BUBBLE STUDY;  Surgeon: Elouise Munroe, MD;  Location: [facility] ENDOSCOPY;  Service: Cardiology;;  . CATARACT EXTRACTION     both eyes  . EYE SURGERY Left   . MASTECTOMY W/ SENTINEL NODE BIOPSY Left 05/09/2019   Procedure: LEFT MASTECTOMY WITH LEFT AXILLARY SENTINEL LYMPH NODE BIOPSY;  Surgeon: Coralie Keens, MD;  Location: [place];  Service: General;  Laterality: Left;  . TEE WITHOUT CARDIOVERSION N/A 05/18/2019   Procedure: TRANSESOPHAGEAL ECHOCARDIOGRAM (TEE);  Surgeon: Elouise Munroe, MD;  Location: [place];  Service: Cardiology;  Laterality: N/A;    There were no vitals filed for this visit.  Subjective Assessment - 11/14/19 1459    Subjective  Patient denied any pain this session. Patient's daughter reported that patient saw  opthamologist and that opthamologist did not mention anything about corrective lenses.    Patient is accompained by:  Family member    Pertinent History  CVA 05/14/19    Limitations  House hold activities;Standing;Walking    How long can you stand comfortably?  Requires assistance and support of walker    How long can you walk comfortably?  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  No/denies                       [facility] Adult PT Treatment/Exercise - 11/14/19 0001      Transfers   Comments  STS from wheelchair and or mat table with moderate assistance for sequencing and for weight shifting anteriorly. Caregiver education on guarding and ambulating with patient performing 2 turns with patient this session, with therapist providing stand by assist with daughter demonstrating ambulation with patient, slow cadence for 60 feet, therapist providing tips on tactile cueing. Education on safe techniques of assisting patient with scooting and principles of good body mechanics for safe lifting with patient's daughter demonstrating understanding.  PT Education - 11/14/19 1504    Education Details  Educated on safety when assisting patient with scooting.    Person(s) Educated  Child(ren);Spouse    Methods  Explanation;Demonstration;Verbal cues    Comprehension  Verbalized understanding;Returned demonstration       PT Short Term Goals - 10/20/19 1123      PT SHORT TERM GOAL #1   Title  Patient will be educated on HEP and report regular compliance to improve safety and independence with functional mobility.    Time  4    Period  Weeks    Status  Achieved    Target Date  09/27/19        PT Long Term Goals - 10/20/19 1123      PT LONG TERM GOAL #1   Title   Patient will demonstrate ability to perform sit to stand transfer with modified independence to improve overall functional mobility.    Baseline  09/22/19- Min A, cueing for forward momentum 10/20/19: min A with verbal cueing to push off WC and shift hips anteriorly    Time  5    Period  Weeks    Status  On-going      PT LONG TERM GOAL #2   Title  Patient will demonstrate ability to ambulate 100 feet with LRAD with no more than minimal assistance and minimal tactile and verbal cues for form.    Baseline  1-/16/20- able to ambulate 48' and 7' with rest break, using RW on level surface with Min A and frequent cueing for BLE sequencing, and keeping BOS within RW. 10/20/19: min to mod assist    Time  5    Period  Weeks    Status  On-going      PT LONG TERM GOAL #3   Title  Patient's caregivers/family will demonstrate ability to transfer and ambulate with patient with proper body mechanics and techniques for proper exercise at home.    Time  5    Period  Weeks    Status  New      PT LONG TERM GOAL #4   Title  Patient will demonstrate ability to correct standing posture and maintain standing balance for at least 1 minute with no more than minimal verbal cueing to improve independence with functional mobility.    Baseline  Able to do, but needs frequent cueing to avoid left leaning    Time  5    Period  Weeks    Status  On-going            Plan - 11/14/19 1514    Clinical Impression Statement  This session continued to focus on family education for safety while working with patient. This session educated patient's family on proper techniques and tips to assist patient with weight shifting and scooting as well as continuing to work on education regarding cueing with ambulation. Patient's daughter demonstrated understanding of techniques following instruction.    Personal Factors and Comorbidities  Age;Comorbidity 1    Comorbidities  Hx Breast Cancer, S/P CVA    Examination-Activity  Limitations  Bathing;Hygiene/Grooming;Squat;Stairs;Bend;Lift;Locomotion Level;Stand;Transfers;Dressing    Examination-Participation Restrictions  Cleaning;Shop;Laundry;Community Activity;Meal Prep    Stability/Clinical Decision Making  Evolving/Moderate complexity    Rehab Potential  Good    PT Frequency  2x / week    PT Duration  Other (comment)   5 weeks   PT Treatment/Interventions  ADLs/Self Care Home Management;Aquatic Therapy;Cryotherapy;Electrical Stimulation;Moist Heat;DME Instruction;Gait training;Stair training;Functional mobility training;Therapeutic activities;Therapeutic exercise;Balance training;Neuromuscular re-education;Patient/family education;Manual  techniques;Passive range of motion;Dry needling;Energy conservation;Taping    PT Next Visit Plan  Trial using sticky notes next session for stride length. Continue to progress ambulation and focus on turning. Gait trainer with visual cues for equal step length or auditory beeps to improve equal step length.    PT Home Exercise Plan  9/28: seated stepping towards sticky note targets for coordination; 10/12: gait training 3x/day before/after sleep depending on fatigue, heel raises and mini squats at countertop for UE support with family at side; 10/19: lateral scooting while seated EOB from HOB<>foot of bed    Consulted and Agree with Plan of Care  Patient;Family member/caregiver    Family Member Consulted  spouse [name]), daughter ([place])       Patient will benefit from skilled therapeutic intervention in order to improve the following deficits and impairments:  Abnormal gait, Impaired sensation, Improper body mechanics, Decreased coordination, Decreased mobility, Postural dysfunction, Decreased activity tolerance, Decreased endurance, Decreased strength, Decreased balance, Decreased safety awareness, Difficulty walking, Impaired vision/preception  Visit Diagnosis: Other symptoms and signs involving the nervous system  Other lack  of coordination  Other abnormalities of gait and mobility     Problem List Patient Active Problem List   Diagnosis Date Noted  . Acute ischemic stroke ([place]) 05/15/2019  . Acute metabolic encephalopathy [id number]  . Encephalopathy   . History of left breast cancer 05/09/2019  . Malignant neoplasm of lower-inner quadrant of left breast in female, estrogen receptor positive ([place]) 04/26/2018   Clarene Critchley PT, DPT 3:17 PM, 11/14/19 [place], [place], [address] Phone: [phone]   Fax:  [phone]  Name: Jessica Harrell MRN: [id number] Date of Birth: 06-02-1941

## 2019-11-17 ENCOUNTER — Other Ambulatory Visit: Payer: Self-pay

## 2019-11-17 ENCOUNTER — Ambulatory Visit (HOSPITAL_COMMUNITY): Payer: Medicare Other | Admitting: Physical Therapy

## 2019-11-17 ENCOUNTER — Encounter (HOSPITAL_COMMUNITY): Payer: Self-pay | Admitting: Physical Therapy

## 2019-11-17 DIAGNOSIS — R29818 Other symptoms and signs involving the nervous system: Secondary | ICD-10-CM

## 2019-11-17 DIAGNOSIS — R278 Other lack of coordination: Secondary | ICD-10-CM

## 2019-11-17 DIAGNOSIS — R2689 Other abnormalities of gait and mobility: Secondary | ICD-10-CM

## 2019-11-17 NOTE — Progress Notes (Signed)
Carelink Summary Report / Loop Recorder 

## 2019-11-17 NOTE — Therapy (Signed)
[place], [place], [address] Phone: [phone]   Fax:  [phone]  Physical Therapy Treatment  Patient Details  Name: Jessica Harrell MRN: [id number] Date of Birth: 1941-06-24 Referring Provider (PT): Kathryne Eriksson MD   Encounter Date: 11/17/2019  PT End of Session - 11/17/19 1323    Visit Number  27    Number of Visits  34    Date for PT Re-Evaluation  11/24/19    Authorization Type  [facility] Medicare Visits based on Medical Necessity    Authorization Time Period  08/30/19 - 10/30/19; 10/20/19-11/24/19    Authorization - Visit Number  27    Authorization - Number of Visits  30    PT Start Time  [id number]    PT Stop Time  1200    PT Time Calculation (min)  39 min    Equipment Utilized During Treatment  Gait belt;Other (comment)   Rolling walker   Activity Tolerance  Patient tolerated treatment well    Behavior During Therapy  WFL for tasks assessed/performed       Past Medical History:  Diagnosis Date  . Breast cancer ([place])   . Right foot pain Nov. 2014    Past Surgical History:  Procedure Laterality Date  . BREAST BIOPSY    . BUBBLE STUDY  05/18/2019   Procedure: BUBBLE STUDY;  Surgeon: Elouise Munroe, MD;  Location: [facility] ENDOSCOPY;  Service: Cardiology;;  . CATARACT EXTRACTION     both eyes  . EYE SURGERY Left   . MASTECTOMY W/ SENTINEL NODE BIOPSY Left 05/09/2019   Procedure: LEFT MASTECTOMY WITH LEFT AXILLARY SENTINEL LYMPH NODE BIOPSY;  Surgeon: Coralie Keens, MD;  Location: [place];  Service: General;  Laterality: Left;  . TEE WITHOUT CARDIOVERSION N/A 05/18/2019   Procedure: TRANSESOPHAGEAL ECHOCARDIOGRAM (TEE);  Surgeon: Elouise Munroe, MD;  Location: [place];  Service: Cardiology;  Laterality: N/A;    There were no vitals filed for this visit.  Subjective Assessment - 11/17/19 1320    Subjective  Patient denied any pain. Patient's daughter reported that scooting has improved at home.     Patient is accompained by:  Family member    Pertinent History  CVA 05/14/19    Limitations  House hold activities;Standing;Walking    How long can you stand comfortably?  Requires assistance and support of walker    How long can you walk comfortably?  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  No/denies                       [facility] Adult PT Treatment/Exercise - 11/17/19 0001               Knee/Hip Exercises: Standing   Gait Training  Ambulation with yellow sticky notes as visual cues for foot placement 30 feet with turn after first 15 feet requiring intermittent moderate and maximal assistance for balance and maximal verbal cues for sequencing x 2 repetitions. Noted improved ability for step-through pattern with this however. Ambulation down 1 straight hallway 70 feet x 1 with metronome set at 50 BPM to improve sequencing and cadence with ambulation continuing to require intermittent moderate to maximal assistance for balance and maximal verbal cues for sequencing  and to correct posture, repeated 30 feet before patient fatigued.              PT Education - 11/17/19 1322    Education Details  Educated on benefits of metronome use with ambulation.    Person(s) Educated  Spouse;Patient;Child(ren)    Methods  Explanation    Comprehension  Verbalized understanding       PT Short Term Goals - 10/20/19 1123      PT SHORT TERM GOAL #1   Title  Patient will be educated on HEP and report regular compliance to improve safety and independence with functional mobility.    Time  4    Period  Weeks    Status  Achieved    Target Date  09/27/19        PT Long Term Goals - 10/20/19 1123      PT LONG TERM GOAL #1   Title  Patient will demonstrate ability to perform  sit to stand transfer with modified independence to improve overall functional mobility.    Baseline  09/22/19- Min A, cueing for forward momentum 10/20/19: min A with verbal cueing to push off WC and shift hips anteriorly    Time  5    Period  Weeks    Status  On-going      PT LONG TERM GOAL #2   Title  Patient will demonstrate ability to ambulate 100 feet with LRAD with no more than minimal assistance and minimal tactile and verbal cues for form.    Baseline  1-/16/20- able to ambulate 22' and 51' with rest break, using RW on level surface with Min A and frequent cueing for BLE sequencing, and keeping BOS within RW. 10/20/19: min to mod assist    Time  5    Period  Weeks    Status  On-going      PT LONG TERM GOAL #3   Title  Patient's caregivers/family will demonstrate ability to transfer and ambulate with patient with proper body mechanics and techniques for proper exercise at home.    Time  5    Period  Weeks    Status  New      PT LONG TERM GOAL #4   Title  Patient will demonstrate ability to correct standing posture and maintain standing balance for at least 1 minute with no more than minimal verbal cueing to improve independence with functional mobility.    Baseline  Able to do, but needs frequent cueing to avoid left leaning    Time  5    Period  Weeks    Status  On-going            Plan - 11/17/19 1340    Clinical Impression Statement  Trialed gait training with sticky notes for visual cues on step length as well as metronome use for improved cadence with ambulation. Patient demonstrated improvement in step-through gait pattern with the sticky notes. With the use of the metronome set at 50 BPM, patient seemed to demonstrate improved sequencing intermittently. However, on second trial of this, patient required frequent cueing for form and assistance for balance eventually requiring a rest break. Patient would benefit from continued progress with trialing these methods of  gait training cueing.    Personal Factors and Comorbidities  Age;Comorbidity 1    Comorbidities  Hx Breast Cancer, S/P CVA    Examination-Activity Limitations  Bathing;Hygiene/Grooming;Squat;Stairs;Bend;Lift;Locomotion Level;Stand;Transfers;Dressing    Examination-Participation Restrictions  Cleaning;Shop;Laundry;Community Activity;Meal Prep    Stability/Clinical Decision  Making  Evolving/Moderate complexity    Rehab Potential  Good    PT Frequency  2x / week    PT Duration  Other (comment)   5 weeks   PT Treatment/Interventions  ADLs/Self Care Home Management;Aquatic Therapy;Cryotherapy;Electrical Stimulation;Moist Heat;DME Instruction;Gait training;Stair training;Functional mobility training;Therapeutic activities;Therapeutic exercise;Balance training;Neuromuscular re-education;Patient/family education;Manual techniques;Passive range of motion;Dry needling;Energy conservation;Taping    PT Next Visit Plan  Continue to progress ambulation and focus on turning. Continue practice with sticky notes or metronome.    PT Home Exercise Plan  9/28: seated stepping towards sticky note targets for coordination; 10/12: gait training 3x/day before/after sleep depending on fatigue, heel raises and mini squats at countertop for UE support with family at side; 10/19: lateral scooting while seated EOB from HOB<>foot of bed    Consulted and Agree with Plan of Care  Patient;Family member/caregiver    Family Member Consulted  spouse [name]), daughter ([place])       Patient will benefit from skilled therapeutic intervention in order to improve the following deficits and impairments:  Abnormal gait, Impaired sensation, Improper body mechanics, Decreased coordination, Decreased mobility, Postural dysfunction, Decreased activity tolerance, Decreased endurance, Decreased strength, Decreased balance, Decreased safety awareness, Difficulty walking, Impaired vision/preception  Visit Diagnosis: Other symptoms and signs  involving the nervous system  Other lack of coordination  Other abnormalities of gait and mobility     Problem List Patient Active Problem List   Diagnosis Date Noted  . Acute ischemic stroke ([place]) 05/15/2019  . Acute metabolic encephalopathy [id number]  . Encephalopathy   . History of left breast cancer 05/09/2019  . Malignant neoplasm of lower-inner quadrant of left breast in female, estrogen receptor positive ([place]) 04/26/2018   Clarene Critchley PT, DPT 1:44 PM, 11/17/19 [place], [place], [address] Phone: [phone]   Fax:  [phone]  Name: Dinia Zimmerle MRN: [id number] Date of Birth: [birth year]

## 2019-11-21 ENCOUNTER — Other Ambulatory Visit: Payer: Self-pay

## 2019-11-21 ENCOUNTER — Encounter (HOSPITAL_COMMUNITY): Payer: Self-pay | Admitting: Physical Therapy

## 2019-11-21 ENCOUNTER — Ambulatory Visit (HOSPITAL_COMMUNITY): Payer: Medicare Other | Admitting: Physical Therapy

## 2019-11-21 DIAGNOSIS — R29818 Other symptoms and signs involving the nervous system: Secondary | ICD-10-CM | POA: Diagnosis not present

## 2019-11-21 DIAGNOSIS — R2689 Other abnormalities of gait and mobility: Secondary | ICD-10-CM

## 2019-11-21 DIAGNOSIS — R278 Other lack of coordination: Secondary | ICD-10-CM

## 2019-11-21 NOTE — Therapy (Signed)
[place], [place], [address] Phone: [phone]   Fax:  [phone]  Physical Therapy Treatment  Patient Details  Name: Jessica Harrell MRN: [id number] Date of Birth: 1941-02-12 Referring Provider (PT): Kathryne Eriksson MD   Encounter Date: 11/21/2019  PT End of Session - 11/21/19 1259    Visit Number  28    Number of Visits  34    Date for PT Re-Evaluation  11/24/19    Authorization Type  [facility] Medicare Visits based on Medical Necessity    Authorization Time Period  08/30/19 - 10/30/19; 10/20/19-11/24/19    Authorization - Visit Number  28    Authorization - Number of Visits  30    PT Start Time  1113    PT Stop Time  1200    PT Time Calculation (min)  47 min    Equipment Utilized During Treatment  Gait belt;Other (comment)   Rolling walker   Activity Tolerance  Patient tolerated treatment well    Behavior During Therapy  WFL for tasks assessed/performed       Past Medical History:  Diagnosis Date  . Breast cancer ([place])   . Right foot pain Nov. 2014    Past Surgical History:  Procedure Laterality Date  . BREAST BIOPSY    . BUBBLE STUDY  05/18/2019   Procedure: BUBBLE STUDY;  Surgeon: Elouise Munroe, MD;  Location: [facility];  Service: Cardiology;;  . CATARACT EXTRACTION     both eyes  . EYE SURGERY Left   . MASTECTOMY W/ SENTINEL NODE BIOPSY Left 05/09/2019   Procedure: LEFT MASTECTOMY WITH LEFT AXILLARY SENTINEL LYMPH NODE BIOPSY;  Surgeon: Coralie Keens, MD;  Location: [place];  Service: General;  Laterality: Left;  . TEE WITHOUT CARDIOVERSION N/A 05/18/2019   Procedure: TRANSESOPHAGEAL ECHOCARDIOGRAM (TEE);  Surgeon: Elouise Munroe, MD;  Location: [place];  Service: Cardiology;  Laterality: N/A;    There were no vitals filed for this visit.  Subjective Assessment - 11/21/19 1255    Subjective  Patient reported feeling alright today. Patient's daughter stated patient has been quiet  today.    Patient is accompained by:  Family member    Pertinent History  CVA 05/14/19    Limitations  House hold activities;Standing;Walking    How long can you stand comfortably?  Requires assistance and support of walker    How long can you walk comfortably?  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  No/denies                       [facility] Adult PT Treatment/Exercise - 11/21/19 0001      Knee/Hip Exercises: Standing   Gait Training  Ambulation with yellow sticky notes as visual cues for foot placement 30 feet with turn after first 15 feet requiring intermittent moderate and maximal assistance for balance and maximal verbal cues for sequencing x 2 repetitions. Noted improved ability for step-through pattern with this however. Ambulation down 1 straight hallway 70 feet x 1 with metronome set at 50 BPM to improve sequencing and cadence with ambulation continuing to require intermittent moderate to maximal assistance for balance and maximal verbal cues for sequencing and to correct posture, repeated 30 feet before patient  fatigued.                PT Short Term Goals - 10/20/19 1123      PT SHORT TERM GOAL #1   Title  Patient will be educated on HEP and report regular compliance to improve safety and independence with functional mobility.    Time  4    Period  Weeks    Status  Achieved    Target Date  09/27/19        PT Long Term Goals - 10/20/19 1123      PT LONG TERM GOAL #1   Title  Patient will demonstrate ability to perform sit to stand transfer with modified independence to improve overall functional mobility.    Baseline  09/22/19- Min A, cueing for forward momentum 10/20/19: min A with verbal cueing to push off WC and shift hips anteriorly    Time  5     Period  Weeks    Status  On-going      PT LONG TERM GOAL #2   Title  Patient will demonstrate ability to ambulate 100 feet with LRAD with no more than minimal assistance and minimal tactile and verbal cues for form.    Baseline  1-/16/20- able to ambulate 49' and 71' with rest break, using RW on level surface with Min A and frequent cueing for BLE sequencing, and keeping BOS within RW. 10/20/19: min to mod assist    Time  5    Period  Weeks    Status  On-going      PT LONG TERM GOAL #3   Title  Patient's caregivers/family will demonstrate ability to transfer and ambulate with patient with proper body mechanics and techniques for proper exercise at home.    Time  5    Period  Weeks    Status  New      PT LONG TERM GOAL #4   Title  Patient will demonstrate ability to correct standing posture and maintain standing balance for at least 1 minute with no more than minimal verbal cueing to improve independence with functional mobility.    Baseline  Able to do, but needs frequent cueing to avoid left leaning    Time  5    Period  Weeks    Status  On-going            Plan - 11/21/19 1323    Clinical Impression Statement  This session patient required increased assistance with ambulation requiring assistance constantly while performing metronome ambulation. Patient's response to cueing and facilitation improved through session and she showed better response to ambulation with sticky notes this session. Also added standing with press outs using blue weighted ball to encourage anterior weight shift in standing, which patient demonstrated improved ability to perform standing with decreased assistance while performing press outs with blue weighted ball.    Personal Factors and Comorbidities  Age;Comorbidity 1    Comorbidities  Hx Breast Cancer, S/P CVA    Examination-Activity Limitations  Bathing;Hygiene/Grooming;Squat;Stairs;Bend;Lift;Locomotion Level;Stand;Transfers;Dressing     Examination-Participation Restrictions  Cleaning;Shop;Laundry;Community Activity;Meal Prep    Stability/Clinical Decision Making  Evolving/Moderate complexity    Rehab Potential  Good    PT Frequency  2x / week    PT Duration  Other (comment)   5 weeks   PT Treatment/Interventions  ADLs/Self Care Home Management;Aquatic Therapy;Cryotherapy;Electrical Stimulation;Moist Heat;DME Instruction;Gait training;Stair training;Functional mobility training;Therapeutic activities;Therapeutic exercise;Balance training;Neuromuscular re-education;Patient/family education;Manual techniques;Passive range of motion;Dry needling;Energy conservation;Taping    PT Next  Visit Plan  Continue to progress ambulation and focus on turning. Continue practice with sticky notes or metronome. Trial purple sticky notes (or another color which contrasts more with floor) and standing press outs with weighted ball to work on standing balance add weighted press outs to HEP if tolerates well.    PT Home Exercise Plan  9/28: seated stepping towards sticky note targets for coordination; 10/12: gait training 3x/day before/after sleep depending on fatigue, heel raises and mini squats at countertop for UE support with family at side; 10/19: lateral scooting while seated EOB from HOB<>foot of bed    Consulted and Agree with Plan of Care  Patient;Family member/caregiver    Family Member Consulted  spouse [name]), daughter ([place])       Patient will benefit from skilled therapeutic intervention in order to improve the following deficits and impairments:  Abnormal gait, Impaired sensation, Improper body mechanics, Decreased coordination, Decreased mobility, Postural dysfunction, Decreased activity tolerance, Decreased endurance, Decreased strength, Decreased balance, Decreased safety awareness, Difficulty walking, Impaired vision/preception  Visit Diagnosis: Other symptoms and signs involving the nervous system  Other lack of  coordination  Other abnormalities of gait and mobility     Problem List Patient Active Problem List   Diagnosis Date Noted  . Acute ischemic stroke ([place]) 05/15/2019  . Acute metabolic encephalopathy [id number]  . Encephalopathy   . History of left breast cancer 05/09/2019  . Malignant neoplasm of lower-inner quadrant of left breast in female, estrogen receptor positive ([place]) 04/26/2018   Clarene Critchley PT, DPT 1:29 PM, 11/21/19 [place], [place], [address] Phone: [phone]   Fax:  [phone]  Name: Aleine Durall MRN: [id number] Date of Birth: Feb 04, 1941

## 2019-11-23 ENCOUNTER — Other Ambulatory Visit: Payer: Self-pay

## 2019-11-23 ENCOUNTER — Ambulatory Visit (INDEPENDENT_AMBULATORY_CARE_PROVIDER_SITE_OTHER): Payer: Medicare Other | Admitting: *Deleted

## 2019-11-23 ENCOUNTER — Encounter (HOSPITAL_COMMUNITY): Payer: Self-pay | Admitting: Physical Therapy

## 2019-11-23 ENCOUNTER — Ambulatory Visit (HOSPITAL_COMMUNITY): Payer: Medicare Other | Admitting: Physical Therapy

## 2019-11-23 DIAGNOSIS — I639 Cerebral infarction, unspecified: Secondary | ICD-10-CM | POA: Diagnosis not present

## 2019-11-23 DIAGNOSIS — R29898 Other symptoms and signs involving the musculoskeletal system: Secondary | ICD-10-CM

## 2019-11-23 DIAGNOSIS — R2689 Other abnormalities of gait and mobility: Secondary | ICD-10-CM

## 2019-11-23 DIAGNOSIS — R278 Other lack of coordination: Secondary | ICD-10-CM

## 2019-11-23 DIAGNOSIS — R29818 Other symptoms and signs involving the nervous system: Secondary | ICD-10-CM | POA: Diagnosis not present

## 2019-11-23 LAB — CUP PACEART REMOTE DEVICE CHECK
Date Time Interrogation Session: 20201217133750
Implantable Pulse Generator Implant Date: 20200903

## 2019-11-23 NOTE — Therapy (Signed)
[place] [address] [place], [place], [address] Phone: [phone]   Fax:  [phone]  Physical Therapy Treatment/ Progress Note   Patient Details  Name: Jessica Harrell MRN: [id number] Date of Birth: 07-09-1941 Referring Provider (PT): Kathryne Eriksson MD   Encounter Date: 11/23/2019   Progress Note Reporting Period 10/20/19 to 11/23/19  See note below for Objective Data and Assessment of Progress/Goals.       PT End of Session - 11/23/19 1632    Visit Number  29    Number of Visits  37    Date for PT Re-Evaluation  12/21/19    Authorization Type  [facility] Medicare Visits based on Medical Necessity    Authorization Time Period  08/30/19 - 10/30/19; 10/20/19-11/24/19; 11/23/19-12/21/19    Authorization - Visit Number  1    Authorization - Number of Visits  10    PT Start Time  1300    PT Stop Time  1345    PT Time Calculation (min)  45 min    Equipment Utilized During Treatment  Gait belt;Other (comment)   Rolling walker   Activity Tolerance  Patient tolerated treatment well    Behavior During Therapy  WFL for tasks assessed/performed       Past Medical History:  Diagnosis Date  . Breast cancer ([place])   . Right foot pain Nov. 2014    Past Surgical History:  Procedure Laterality Date  . BREAST BIOPSY    . BUBBLE STUDY  05/18/2019   Procedure: BUBBLE STUDY;  Surgeon: Elouise Munroe, MD;  Location: [facility] ENDOSCOPY;  Service: Cardiology;;  . CATARACT EXTRACTION     both eyes  . EYE SURGERY Left   . MASTECTOMY W/ SENTINEL NODE BIOPSY Left 05/09/2019   Procedure: LEFT MASTECTOMY WITH LEFT AXILLARY SENTINEL LYMPH NODE BIOPSY;  Surgeon: Coralie Keens, MD;  Location: [place];  Service: General;  Laterality: Left;  . TEE WITHOUT CARDIOVERSION N/A 05/18/2019   Procedure: TRANSESOPHAGEAL ECHOCARDIOGRAM (TEE);  Surgeon: Elouise Munroe, MD;  Location: [place];  Service: Cardiology;  Laterality: N/A;    There were no  vitals filed for this visit.  Subjective Assessment - 11/23/19 1853    Subjective  Patient family members say patient is doing well, no falls lately. They say she has been getting out of bed and walking better.    Patient is accompained by:  Family member    Pertinent History  CVA 05/14/19    Limitations  House hold activities;Standing;Walking    How long can you stand comfortably?  Requires assistance and support of walker    How long can you walk comfortably?  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  No/denies         [facility] PT Assessment - 11/23/19 0001      Assessment   Medical Diagnosis  s/p CVA    Referring Provider (PT)  Kathryne Eriksson MD    Onset Date/Surgical Date  05/14/19    Prior Therapy  Yes 30 days at [facility], and 60 days of [facility] 2-3 times per week      Precautions   Precautions  Fall;Other (comment)    Precaution Comments  Visual deficit- cortical blindness. Use contrast for objects during session.  Restrictions   Weight Bearing Restrictions  No      Balance Screen   Has the patient fallen in the past 6 months  Yes      [place] residence    Living Arrangements  Spouse/significant other    Available Help at Discharge  Family    Type of [place] entrance    [place] to live on main level with bedroom/bathroom    [place] - 2 wheels;Wheelchair - manual;Bedside commode;Grab bars - tub/shower;Grab bars - toilet      Prior Function   Level of Independence  Independent      Cognition   Overall Cognitive Status  Impaired/Different from baseline      Transfers   Sit to Stand  4: Min guard    Sit to Stand Details  Verbal cues for sequencing;Verbal cues for  technique      Ambulation/Gait   Ambulation/Gait  Yes    Ambulation/Gait Assistance  4: Min assist    Ambulation Distance (Feet)  50 Feet    Assistive device  Rolling walker    Gait Pattern  Ataxic;Narrow base of support;Poor foot clearance - right;Left steppage    Ambulation Surface  Level;Indoor                   [facility] Adult PT Treatment/Exercise - 11/23/19 0001      Knee/Hip Exercises: Standing   Gait Training  50 feet x 2; with rolling walker; daughter assisted; tactile cues for weight shifting; verbal cues for step length, ground clearance and sequencing      Knee/Hip Exercises: Seated   Sit to Sand  2 sets;5 reps;with UE support   daughter assisting with verbal cues for momentum and hands         Balance Exercises - 11/23/19 1906      Balance Exercises: Standing   Other Standing Exercises  standing weighshifts; staggered stance using RW; both; x10 each; Min A for balance        PT Education - 11/23/19 1630    Education Details  Patient and family members educated on reassessment findings, POC and plan on increasing their involvment in remaining treatment sessions.    Person(s) Educated  Patient;Child(ren);Spouse    Methods  Explanation    Comprehension  Verbalized understanding       PT Short Term Goals - 10/20/19 1123      PT SHORT TERM GOAL #1   Title  Patient will be educated on HEP and report regular compliance to improve safety and independence with functional mobility.    Time  4    Period  Weeks    Status  Achieved    Target Date  09/27/19        PT Long Term Goals - 11/23/19 1855      PT LONG TERM GOAL #1   Title  Patient will demonstrate ability to perform sit to stand transfer with modified independence to improve overall functional mobility.    Baseline  09/22/19- Min A, cueing for forward momentum 10/20/19: min A with verbal cueing to push off WC and shift hips anteriorly; 11/23/19: Requires Min A/ Min gaurd and min verbal cueing  for momentum and hand placement    Time  5    Period  Weeks    Status  On-going    Target Date  12/21/19  PT LONG TERM GOAL #2   Title  Patient will demonstrate ability to ambulate 100 feet with LRAD with no more than minimal assistance and minimal tactile and verbal cues for form.    Baseline  11/23/19: able to perform 50 and 50 with rest break due to fatigue, using rolling walker and Min A for balance, tactile cueing for weight shifting, verbal cueing for sequencing of BLEs    Time  5    Period  Weeks    Status  On-going      PT LONG TERM GOAL #3   Title  Patient's caregivers/family will demonstrate ability to transfer and ambulate with patient with proper body mechanics and techniques for proper exercise at home.    Time  5    Period  Weeks    Status  On-going      PT LONG TERM GOAL #4   Title  Patient will demonstrate ability to correct standing posture and maintain standing balance for at least 1 minute with no more than minimal verbal cueing to improve independence with functional mobility.    Baseline  Able to do, but needs frequent cueing to avoid left leaning    Time  5    Period  Weeks    Status  On-going            Plan - 11/23/19 1633    Clinical Impression Statement  Patient has made some functional progress since last reassessment. Patient still has much difficulty with proprioception and being able to maintain standing balance independently. Patient shows continued tendency to lean backward, and has difficulty correcting even with verbal cueing. Patient is now able perform sit to stand with Min A/ Min guard, but with limited return on hand placement and body momentum. Patient able to improve overall ambulation distance, but requires tactile cueing for weight shifting to advance limbs and frequent verbal cueing for RT or LT limb advancement, which patient appears to have difficulty with differentiating, especially depending on level of fatigue. Patient's family  members, who have been present, and are increasingly active in all of patient's treatments, have expressed that they do not yet feel fully confident with patient transition to IND with home exercise. At this point patient would benefit from continued physical therapy, to address remaining deficits, with focus on family/ caregiver training with cues, transfers, functional mobility assist, so that family members may feel confident for patient to be discharged to home care.    Personal Factors and Comorbidities  Age;Comorbidity 1    Comorbidities  Hx Breast Cancer, S/P CVA    Examination-Activity Limitations  Bathing;Hygiene/Grooming;Squat;Stairs;Bend;Lift;Locomotion Level;Stand;Transfers;Dressing    Examination-Participation Restrictions  Cleaning;Shop;Laundry;Community Activity;Meal Prep    Stability/Clinical Decision Making  Evolving/Moderate complexity    Rehab Potential  Good    PT Frequency  2x / week    PT Duration  4 weeks   5 weeks   PT Treatment/Interventions  ADLs/Self Care Home Management;Aquatic Therapy;Cryotherapy;Electrical Stimulation;Moist Heat;DME Instruction;Gait training;Stair training;Functional mobility training;Therapeutic activities;Therapeutic exercise;Balance training;Neuromuscular re-education;Patient/family education;Manual techniques;Passive range of motion;Dry needling;Energy conservation;Taping    PT Next Visit Plan  Continue to progress gait distance, limb sequencing, and family involvement/ education on patient care    PT Home Exercise Plan  9/28: seated stepping towards sticky note targets for coordination; 10/12: gait training 3x/day before/after sleep depending on fatigue, heel raises and mini squats at countertop for UE support with family at side; 10/19: lateral scooting while seated EOB from HOB<>foot of bed    Consulted and  Agree with Plan of Care  Patient;Family member/caregiver    Family Member Consulted  spouse [name]), daughter ([place])       Patient will  benefit from skilled therapeutic intervention in order to improve the following deficits and impairments:  Abnormal gait, Impaired sensation, Improper body mechanics, Decreased coordination, Decreased mobility, Postural dysfunction, Decreased activity tolerance, Decreased endurance, Decreased strength, Decreased balance, Decreased safety awareness, Difficulty walking, Impaired vision/preception  Visit Diagnosis: Other symptoms and signs involving the nervous system  Other lack of coordination  Other abnormalities of gait and mobility  Other symptoms and signs involving the musculoskeletal system     Problem List Patient Active Problem List   Diagnosis Date Noted  . Acute ischemic stroke ([place]) 05/15/2019  . Acute metabolic encephalopathy [id number]  . Encephalopathy   . History of left breast cancer 05/09/2019  . Malignant neoplasm of lower-inner quadrant of left breast in female, estrogen receptor positive ([place]) 04/26/2018   7:08 PM, 11/23/19 Josue Hector PT DPT  Physical Therapist with [facility]  [phone] [place], [place], [address] Phone: [phone]   Fax:  [phone]  Name: Katheren Mowell MRN: [id number] Date of Birth: January 08, 1941

## 2019-11-24 ENCOUNTER — Ambulatory Visit (HOSPITAL_COMMUNITY): Payer: Medicare Other

## 2019-11-24 ENCOUNTER — Encounter: Payer: Medicare Other | Admitting: Adult Health

## 2019-11-27 ENCOUNTER — Ambulatory Visit (HOSPITAL_COMMUNITY): Payer: Medicare Other | Admitting: Physical Therapy

## 2019-11-27 ENCOUNTER — Other Ambulatory Visit: Payer: Self-pay

## 2019-11-27 ENCOUNTER — Encounter (HOSPITAL_COMMUNITY): Payer: Self-pay | Admitting: Physical Therapy

## 2019-11-27 ENCOUNTER — Telehealth: Payer: Self-pay | Admitting: Adult Health

## 2019-11-27 DIAGNOSIS — R29818 Other symptoms and signs involving the nervous system: Secondary | ICD-10-CM | POA: Diagnosis not present

## 2019-11-27 DIAGNOSIS — R2689 Other abnormalities of gait and mobility: Secondary | ICD-10-CM

## 2019-11-27 DIAGNOSIS — R29898 Other symptoms and signs involving the musculoskeletal system: Secondary | ICD-10-CM

## 2019-11-27 DIAGNOSIS — R278 Other lack of coordination: Secondary | ICD-10-CM

## 2019-11-27 NOTE — Therapy (Signed)
[place], [place], [address] Phone: [phone]   Fax:  [phone]  Physical Therapy Treatment  Patient Details  Name: Jessica Harrell MRN: [id number] Date of Birth: 12-30-1940 Referring Provider (PT): Kathryne Eriksson MD   Encounter Date: 11/27/2019  PT End of Session - 11/27/19 1038    Visit Number  30    Number of Visits  37    Date for PT Re-Evaluation  12/21/19    Authorization Type  [facility] Medicare Visits based on Medical Necessity    Authorization Time Period  08/30/19 - 10/30/19; 10/20/19-11/24/19; 11/23/19-12/21/19    Authorization - Visit Number  2    Authorization - Number of Visits  10    PT Start Time  1031    PT Stop Time  1120    PT Time Calculation (min)  49 min    Equipment Utilized During Treatment  Gait belt;Other (comment)   Rolling walker   Activity Tolerance  Patient tolerated treatment well    Behavior During Therapy  WFL for tasks assessed/performed       Past Medical History:  Diagnosis Date  . Breast cancer ([place])   . Right foot pain Nov. 2014    Past Surgical History:  Procedure Laterality Date  . BREAST BIOPSY    . BUBBLE STUDY  05/18/2019   Procedure: BUBBLE STUDY;  Surgeon: Elouise Munroe, MD;  Location: [facility] ENDOSCOPY;  Service: Cardiology;;  . CATARACT EXTRACTION     both eyes  . EYE SURGERY Left   . MASTECTOMY W/ SENTINEL NODE BIOPSY Left 05/09/2019   Procedure: LEFT MASTECTOMY WITH LEFT AXILLARY SENTINEL LYMPH NODE BIOPSY;  Surgeon: Coralie Keens, MD;  Location: [place];  Service: General;  Laterality: Left;  . TEE WITHOUT CARDIOVERSION N/A 05/18/2019   Procedure: TRANSESOPHAGEAL ECHOCARDIOGRAM (TEE);  Surgeon: Elouise Munroe, MD;  Location: [place];  Service: Cardiology;  Laterality: N/A;    There were no vitals filed for this visit.  Subjective Assessment - 11/27/19 1037    Subjective  Patient family members reports no new issues, no falls since last  visit. Patient's husband says getting out of bed was easier this morning, and that he was able to walk with her a little this morning.    Patient is accompained by:  Family member    Pertinent History  CVA 05/14/19    Limitations  House hold activities;Standing;Walking    How long can you stand comfortably?  Requires assistance and support of walker    How long can you walk comfortably?  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  No/denies                       [facility] Adult PT Treatment/Exercise - 11/27/19 0001      Knee/Hip Exercises: Aerobic   Recumbent Bike  5 minutes, level 3      Knee/Hip Exercises: Standing   Gait Training  50 feet x 2; with rolling walker; daughter assisted; tactile cues for weight shifting; verbal cues for step length, ground clearance and sequencing      Knee/Hip Exercises: Seated   Sit to Sand  2 sets;5 reps;with UE support   daughter assits with verbal cues for momentum  and hands              PT Short Term Goals - 10/20/19 1123      PT SHORT TERM GOAL #1   Title  Patient will be educated on HEP and report regular compliance to improve safety and independence with functional mobility.    Time  4    Period  Weeks    Status  Achieved    Target Date  09/27/19        PT Long Term Goals - 11/23/19 1855      PT LONG TERM GOAL #1   Title  Patient will demonstrate ability to perform sit to stand transfer with modified independence to improve overall functional mobility.    Baseline  09/22/19- Min A, cueing for forward momentum 10/20/19: min A with verbal cueing to push off WC and shift hips anteriorly; 11/23/19: Requires Min A/ Min gaurd and min verbal cueing for momentum and hand placement    Time  5    Period  Weeks    Status   On-going    Target Date  12/21/19      PT LONG TERM GOAL #2   Title  Patient will demonstrate ability to ambulate 100 feet with LRAD with no more than minimal assistance and minimal tactile and verbal cues for form.    Baseline  11/23/19: able to perform 50 and 50 with rest break due to fatigue, using rolling walker and Min A for balance, tactile cueing for weight shifting, verbal cueing for sequencing of BLEs    Time  5    Period  Weeks    Status  On-going      PT LONG TERM GOAL #3   Title  Patient's caregivers/family will demonstrate ability to transfer and ambulate with patient with proper body mechanics and techniques for proper exercise at home.    Time  5    Period  Weeks    Status  On-going      PT LONG TERM GOAL #4   Title  Patient will demonstrate ability to correct standing posture and maintain standing balance for at least 1 minute with no more than minimal verbal cueing to improve independence with functional mobility.    Baseline  Able to do, but needs frequent cueing to avoid left leaning    Time  5    Period  Weeks    Status  On-going            Plan - 11/27/19 1250    Clinical Impression Statement  Patient tolerated session well overall. Patient showed some improved return for hand placement with sit to stands, but had trouble initiating forward momentum. Patient showed improved return with gait training today, but with noted decrease in RLE ground clearance upon fatigue. Patient appears to have better return for step through gait pattern after using recumbent bike. Patient daughter participated in today's session and was cued on proper tactile cueing for facilitating weight shift during gait training.    Personal Factors and Comorbidities  Age;Comorbidity 1    Comorbidities  Hx Breast Cancer, S/P CVA    Examination-Activity Limitations  Bathing;Hygiene/Grooming;Squat;Stairs;Bend;Lift;Locomotion Level;Stand;Transfers;Dressing    Examination-Participation  Restrictions  Cleaning;Shop;Laundry;Community Activity;Meal Prep    Stability/Clinical Decision Making  Evolving/Moderate complexity    Rehab Potential  Good    PT Frequency  2x / week    PT Duration  4 weeks    PT Treatment/Interventions  ADLs/Self Care Home Management;Aquatic Therapy;Cryotherapy;Electrical Stimulation;Moist Heat;DME Instruction;Gait  training;Stair training;Functional mobility training;Therapeutic activities;Therapeutic exercise;Balance training;Neuromuscular re-education;Patient/family education;Manual techniques;Passive range of motion;Dry needling;Energy conservation;Taping    PT Next Visit Plan  Continue to progress gait distance, limb sequencing, and family involvement/ education on patient care    PT Home Exercise Plan  9/28: seated stepping towards sticky note targets for coordination; 10/12: gait training 3x/day before/after sleep depending on fatigue, heel raises and mini squats at countertop for UE support with family at side; 10/19: lateral scooting while seated EOB from HOB<>foot of bed    Consulted and Agree with Plan of Care  Patient;Family member/caregiver    Family Member Consulted  spouse [name]), daughter ([place])       Patient will benefit from skilled therapeutic intervention in order to improve the following deficits and impairments:  Abnormal gait, Impaired sensation, Improper body mechanics, Decreased coordination, Decreased mobility, Postural dysfunction, Decreased activity tolerance, Decreased endurance, Decreased strength, Decreased balance, Decreased safety awareness, Difficulty walking, Impaired vision/preception  Visit Diagnosis: Other symptoms and signs involving the nervous system  Other lack of coordination  Other abnormalities of gait and mobility  Other symptoms and signs involving the musculoskeletal system     Problem List Patient Active Problem List   Diagnosis Date Noted  . Acute ischemic stroke ([place]) 05/15/2019  . Acute  metabolic encephalopathy [id number]  . Encephalopathy   . History of left breast cancer 05/09/2019  . Malignant neoplasm of lower-inner quadrant of left breast in female, estrogen receptor positive ([place]) 04/26/2018   12:58 PM, 11/27/19 Josue Hector PT DPT  Physical Therapist with [facility]  [phone] [place], [place], [address] Phone: [phone]   Fax:  [phone]  Name: Janyah Kirschenmann MRN: [id number] Date of Birth: 01-23-1941

## 2019-11-28 ENCOUNTER — Encounter: Payer: Self-pay | Admitting: General Practice

## 2019-11-28 ENCOUNTER — Inpatient Hospital Stay: Payer: Medicare Other | Attending: Adult Health | Admitting: Adult Health

## 2019-11-28 ENCOUNTER — Encounter: Payer: Self-pay | Admitting: Adult Health

## 2019-11-28 DIAGNOSIS — R911 Solitary pulmonary nodule: Secondary | ICD-10-CM | POA: Diagnosis not present

## 2019-11-28 NOTE — Progress Notes (Signed)
[place] CSW Progress Notes  Spoke w patient's daughter.  Explained what Medicare will pay for in terms of home health support.  Family feels patient and husband need non skilled help a few days/week.  Daughter had connected w Aging, Disability [facility] Transportation Services in [place] - they were on a list to get a private pay home health aide who was available to work in their area.  When call came that an aide had been identified, family was not able to take advantage of this person at that time.  Daughter aware that [employer]TS is the best option for finding private pay aide in their area.  Will reconnect w Jake Seats and get back on the list.  Are with [facility], [facility] Pulling [facility] [place] is their PCP.  HH orders have been placed through Dr Redmond Pulling.  Edwyna Shell, LCSW Clinical Social Worker Phone:  [phone] Cell:  [phone]

## 2019-11-28 NOTE — Progress Notes (Signed)
SURVIVORSHIP VIRTUAL VISIT:  I connected with Jessica Harrell on 11/28/19 at 11:30 AM EST by Telephone and verified that I am speaking with the correct person using two identifiers.  I discussed the limitations, risks, security and privacy concerns of performing an evaluation and management service by telephone and the availability of in person appointments. I also discussed with the patient that there may be a patient responsible charge related to this service. The patient expressed understanding and agreed to proceed.   Patient location: at home in private location Provider Location: [place] office Others participating in call: daughter Jessica Harrell, husband Jessica Harrell   BRIEF ONCOLOGIC HISTORY:  Oncology History  Malignant neoplasm of lower-inner quadrant of left breast in female, estrogen receptor positive ([place])  04/12/2018 Initial Diagnosis   Palpable mass lower inner quadrant left breast approximately measures 6 cm, biopsy ([id number]) revealed grade 1 IDC with extracellular mucin, ER 100%, PR 5%, HER-2 negative ratio 1.23, Ki-67 15% 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; stage II a clinical stage AJCC 8   04/27/2018 Cancer Staging   Staging form: Breast, AJCC 8th Edition - Clinical: Stage IIA (cT3, cN0, cM0, G2, ER+, PR+, HER2-) - Signed by Eppie Gibson, MD on 04/27/2018   04/27/2018 - 04/2025 Anti-estrogen oral therapy   Neoadjuvant antiestrogen therapy with letrozole 2.5 mg daily   05/09/2019 Surgery   Left mastectomy Ninfa Linden) [phone]): residual IDC, grade 2, 1.4cm, HER-2 - (1+), ER+ 100%, PR -, Ki67 5%, clear margins, with a 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   08/09/2019 Cancer Staging   Staging form: Breast, AJCC 8th Edition - Pathologic stage from 08/09/2019: Stage IA (pT1c, pN0, cM0, G2, ER+, PR-, HER2-) - Signed by Nicholas Lose, MD on 08/09/2019     INTERVAL HISTORY:  [name] to review her survivorship care plan  detailing her treatment course for breast cancer, as well as monitoring long-term side effects of that treatment, education regarding health maintenance, screening, and overall wellness and health promotion.     Overall, [name] is doing moderately well.  She was hospitalized over the summer for a stroke.  Her vision was affected, however has returned to some extent of a visual field off center and to the left with both eyes, and this area is small.  She is working with PT and is not yet walking.  The left side was more affected with ataxia, the right side works but is a little bit weaker due to lack of use.  Jessica Harrell is feeding herself.  She initially had some mild dysphagia, so her food is cut up in small bites.  She has no other restrictions on her diet such as thickened liquids, etc. She is able to wash herself, and requires assistance with walking, etc.  Her daughter Jessica Harrell comes every day from 10-5 to help.  Her dad and her are trying to get someone to come and help at home.  Her dad Jessica Harrell is having some difficulty with dealing with all of the assistance [name] needs.    She had a loop recorder placed in 08/2019 to monitor for A fib.  There have been no concerns since that has happened.    Jessica Harrell is tolerating the Letrozole well. She is taking it daily.  She has not had a mammogram due to the stroke and mobility limitation.    REVIEW OF SYSTEMS:  Review of Systems  Constitutional: Negative for appetite change, chills, fatigue and fever.  HENT:  Negative for hearing loss and lump/mass.   Eyes: Positive for eye problems (see interval history).  Respiratory: Negative for chest tightness, cough and shortness of breath.   Cardiovascular: Negative for chest pain, leg swelling and palpitations.  Gastrointestinal: Negative for abdominal distention, abdominal pain, constipation, diarrhea, nausea and vomiting.  Endocrine: Negative for hot flashes.  Genitourinary: Negative for difficulty urinating.    Musculoskeletal: Negative for arthralgias.  Skin: Negative for itching and rash.  Neurological: Positive for extremity weakness and speech difficulty. Negative for dizziness, headaches and numbness.  Hematological: Negative for adenopathy. Does not bruise/bleed easily.  Psychiatric/Behavioral: Negative for depression. The patient is not nervous/anxious.   Breast: Denies any new nodularity, masses, tenderness, nipple changes, or nipple discharge.      ONCOLOGY TREATMENT TEAM:  1. Surgeon:  Dr. Ninfa Linden at [facility] 2. Medical Oncologist: Dr. Lindi Adie      PAST MEDICAL/SURGICAL HISTORY:  Past Medical History:  Diagnosis Date  . Breast cancer ([place])   . Right foot pain Nov. 2014   Past Surgical History:  Procedure Laterality Date  . BREAST BIOPSY    . BUBBLE STUDY  05/18/2019   Procedure: BUBBLE STUDY;  Surgeon: Elouise Munroe, MD;  Location: [facility] ENDOSCOPY;  Service: Cardiology;;  . CATARACT EXTRACTION     both eyes  . EYE SURGERY Left   . MASTECTOMY W/ SENTINEL NODE BIOPSY Left 05/09/2019   Procedure: LEFT MASTECTOMY WITH LEFT AXILLARY SENTINEL LYMPH NODE BIOPSY;  Surgeon: Coralie Keens, MD;  Location: [place];  Service: General;  Laterality: Left;  . TEE WITHOUT CARDIOVERSION N/A 05/18/2019   Procedure: TRANSESOPHAGEAL ECHOCARDIOGRAM (TEE);  Surgeon: Elouise Munroe, MD;  Location: [place];  Service: Cardiology;  Laterality: N/A;     ALLERGIES:  Allergies  Allergen Reactions  . Atorvastatin Other (See Comments)    Muscle pain      CURRENT MEDICATIONS:  Outpatient Encounter Medications as of 11/28/2019  Medication Sig  . amLODipine (NORVASC) 5 MG tablet Take 5 mg by mouth daily.  Marland Kitchen aspirin EC 81 MG tablet Take 2 tablets by mouth daily.  Marland Kitchen letrozole (FEMARA) 2.5 MG tablet TAKE 1 TABLET ONCE DAILY.  . mirtazapine (REMERON) 15 MG tablet Take 1 tablet (15 mg total) by mouth at bedtime.  . rosuvastatin (CRESTOR) 10 MG tablet Take 10 mg by mouth  daily.   No facility-administered encounter medications on file as of 11/28/2019.     ONCOLOGIC FAMILY HISTORY:  Family History  Problem Relation Age of Onset  . Cancer Mother        esophageal  . Hypertension Mother   . Varicose Veins Mother   . Cancer Sister        non hodgkins  . Cancer Other        unknown     GENETIC COUNSELING/TESTING: Not at this time  SOCIAL HISTORY:  Social History   Socioeconomic History  . Marital status: Married    Spouse name: Not on file  . Number of children: Not on file  . Years of education: Not on file  . Highest education level: Not on file  Occupational History    Comment: retired  Tobacco Use  . Smoking status: Current Every Day Smoker    Packs/day: 0.50    Years: 58.00    Pack years: 29.00    Types: Cigarettes  . Smokeless tobacco: Never Used  Substance and Sexual Activity  . Alcohol use: Yes    Alcohol/week: 7.0 standard drinks  Types: 7 Cans of beer per week    Comment: 1 beer every 1-2 days  . Drug use: No  . Sexual activity: Not Currently  Other Topics Concern  . Not on file  Social History Narrative   Resides in [place]. Has one grown daughter. No grandchildren. No pets.    Social Determinants of Health   Financial Resource Strain:   . Difficulty of Paying Living Expenses: Not on file  Food Insecurity:   . Worried About Charity fundraiser in the Last Year: Not on file  . Ran Out of Food in the Last Year: Not on file  Transportation Needs:   . Lack of Transportation (Medical): Not on file  . Lack of Transportation (Non-Medical): Not on file  Physical Activity:   . Days of Exercise per Week: Not on file  . Minutes of Exercise per Session: Not on file  Stress:   . Feeling of Stress : Not on file  Social Connections:   . Frequency of Communication with Friends and Family: Not on file  . Frequency of Social Gatherings with Friends and Family: Not on file  . Attends Religious Services: Not on file  .  Active Member of Clubs or Organizations: Not on file  . Attends Archivist Meetings: Not on file  . Marital Status: Not on file  Intimate Partner Violence:   . Fear of Current or Ex-Partner: Not on file  . Emotionally Abused: Not on file  . Physically Abused: Not on file  . Sexually Abused: Not on file     OBSERVATIONS/OBJECTIVE:  Patient answers direct questions.  Speech changes secondary to CVA.    LABORATORY DATA:  None for this visit.  DIAGNOSTIC IMAGING:  None for this visit.      ASSESSMENT AND PLAN:  Ms.. Andreoni is a pleasant 78 y.o. female with Stage IIA left breast invasive ductal carcinoma, ER+/PR+/HER2-, diagnosed in 04/2018, treated with neoadjuvant anti estrogen therapy, left mastectomy, and resumption of anti estrogen therapy.  She presents to the Survivorship Clinic for our initial meeting and routine follow-up post-completion of treatment for breast cancer.    1. Stage IIA left breast cancer:  Ms. Belay is continuing to recover from definitive treatment for breast cancer. She will follow-up with her medical oncologist, Dr. Lindi Adie in 08/2020 with history and physical exam per surveillance protocol.  She will continue her anti-estrogen therapy with Letrozole. Thus far, she is tolerating the Letrozole well, with minimal side effects. She was instructed to make Dr. Lindi Adie or myself aware if she begins to experience any worsening side effects of the medication and I could see her back in clinic to help manage those side effects, as needed. Her mammogram is due and this is on hold secondary to the CVA. Today, a comprehensive survivorship care plan and treatment summary was reviewed with the patient today detailing her breast cancer diagnosis, treatment course, potential late/long-term effects of treatment, appropriate follow-up care with recommendations for the future, and patient education resources.  A copy of this summary, along with a letter will be sent to the  patient's primary care provider via mail/fax/In Basket message after today's visit.    2.  CVA: Patient Is recovering and regaining strength.  She has great assistance at home, however could really use some assistance with a home care aid a couple of times a day, as well as continuing PT.  She is following up with Dr. Redmond Pulling about this.  I asked if our  social workers might be able to help reach out to a SW at PCP office to f/u.    3. Pulmonary nodules on CT scan: She had some pulmonary nodules noted on CT scan in June.  A 3 month f/u was recommended.  She and her family would like to postpone this until the spring.  I have placed orders for the CT scan in 03/2020.    4. Bone health:  Given Ms. Geeslin's age/history of breast cancer and her current treatment regimen including anti-estrogen therapy with Letrozole, she is at risk for bone demineralization.  She is unsure of her most recent bone density testing, however, her daughter is going to ask her PCP about this.  I let her know that once she feels strong enough to undergo mammogram, we can order bone density testing for that time as well.   In the meantime, she was encouraged to increase her consumption of foods rich in calcium, as well as increase her weight-bearing activities.  She was given education on specific activities to promote bone health.  5. Cancer screening:  Due to Ms. Olveda's history and her age, she should receive screening for skin cancers, colon cancer, and gynecologic cancers.  The information and recommendations are listed on the patient's comprehensive care plan/treatment summary and were reviewed in detail with the patient.    6. Health maintenance and wellness promotion: Ms. Nilson was encouraged to consume 5-7 servings of fruits and vegetables per day. We reviewed the "Nutrition Rainbow" handout, as well as the handout "Take Control of Your Health and Reduce Your Cancer Risk" from the [facility].  She was also encouraged  to engage in moderate to vigorous exercise for 30 minutes per day most days of the week. We discussed the [employer] fitness program, which is designed for cancer survivors to help them become more physically fit after cancer treatments.  She was instructed to limit her alcohol consumption and continue to abstain from tobacco use.     7. Support services/counseling: It is not uncommon for this period of the patient's cancer care trajectory to be one of many emotions and stressors.  We discussed how this can be increasingly difficult during the times of quarantine and social distancing due to the COVID-19 pandemic.   She was given information regarding our available services and encouraged to contact me with any questions or for help enrolling in any of our support group/programs.    Follow up instructions:    -Return to cancer center 08/2020 for f/u with Dr. Lindi Adie  -Mammogram due (delayed secondary to CVA) -Follow up with surgery 02/2020 -She is welcome to return back to the Survivorship Clinic at any time; no additional follow-up needed at this time.  -Consider referral back to survivorship as a long-term survivor for continued surveillance  The patient was provided an opportunity to ask questions and all were answered. The patient agreed with the plan and demonstrated an understanding of the instructions.   The patient was advised to call back or seek an in-person evaluation if the symptoms worsen or if the condition fails to improve as anticipated.   I provided 25 minutes of non face-to-face telephone visit time during this encounter, and > 50% was spent counseling as documented under my assessment & plan.  Scot Dock, NP

## 2019-11-30 ENCOUNTER — Encounter (HOSPITAL_COMMUNITY): Payer: Self-pay

## 2019-11-30 ENCOUNTER — Other Ambulatory Visit: Payer: Self-pay

## 2019-11-30 ENCOUNTER — Ambulatory Visit (HOSPITAL_COMMUNITY): Payer: Medicare Other

## 2019-11-30 DIAGNOSIS — R278 Other lack of coordination: Secondary | ICD-10-CM

## 2019-11-30 DIAGNOSIS — R2689 Other abnormalities of gait and mobility: Secondary | ICD-10-CM

## 2019-11-30 DIAGNOSIS — R29898 Other symptoms and signs involving the musculoskeletal system: Secondary | ICD-10-CM

## 2019-11-30 DIAGNOSIS — R29818 Other symptoms and signs involving the nervous system: Secondary | ICD-10-CM

## 2019-11-30 NOTE — Therapy (Signed)
[place], [place], [address] Phone: [phone]   Fax:  [phone]  Physical Therapy Treatment  Patient Details  Name: Jessica Harrell MRN: [id number] Date of Birth: [birth year] Referring Provider (PT): Kathryne Eriksson MD   Encounter Date: 11/30/2019  PT End of Session - 11/30/19 1148    Visit Number  31    Number of Visits  37    Date for PT Re-Evaluation  12/21/19    Authorization Type  [facility] Medicare Visits based on Medical Necessity    Authorization Time Period  08/30/19 - 10/30/19; 10/20/19-11/24/19; 11/23/19-12/21/19    Authorization - Visit Number  3    Authorization - Number of Visits  10    PT Start Time  [id number]    PT Stop Time  1220    PT Time Calculation (min)  45 min    Equipment Utilized During Treatment  Gait belt;Other (comment)   RW   Activity Tolerance  Patient tolerated treatment well;Patient limited by fatigue    Behavior During Therapy  [facility] for tasks assessed/performed       Past Medical History:  Diagnosis Date  . Breast cancer ([place])   . Right foot pain Nov. 2014    Past Surgical History:  Procedure Laterality Date  . BREAST BIOPSY    . BUBBLE STUDY  05/18/2019   Procedure: BUBBLE STUDY;  Surgeon: Elouise Munroe, MD;  Location: [facility] ENDOSCOPY;  Service: Cardiology;;  . CATARACT EXTRACTION     both eyes  . EYE SURGERY Left   . MASTECTOMY W/ SENTINEL NODE BIOPSY Left 05/09/2019   Procedure: LEFT MASTECTOMY WITH LEFT AXILLARY SENTINEL LYMPH NODE BIOPSY;  Surgeon: Coralie Keens, MD;  Location: [place];  Service: General;  Laterality: Left;  . TEE WITHOUT CARDIOVERSION N/A 05/18/2019   Procedure: TRANSESOPHAGEAL ECHOCARDIOGRAM (TEE);  Surgeon: Elouise Munroe, MD;  Location: [place];  Service: Cardiology;  Laterality: N/A;    There were no vitals filed for this visit.  Subjective Assessment - 11/30/19 1135    Subjective  Daughter and husband reports improvements following  every PT sessoin.  Daughters reports she will receive a bike for Christmas.    Pertinent History  CVA 05/14/19    Patient Stated Goals  Walk better    Currently in Pain?  No/denies                       [facility] Adult PT Treatment/Exercise - 11/30/19 0001      Knee/Hip Exercises: Aerobic   Recumbent Bike  5 minutes, level 3      Knee/Hip Exercises: Standing   Gait Training  50 feet x 2; with rolling walker; daughter assisted; tactile cues for weight shifting; verbal cues for step length, ground clearance and sequencing    Other Standing Knee Exercises  weight shifting R/L and Anterior/posture for balance; sidestep from of mat wiht BUE on therapist, tactile cueing for weight shifting    Other Standing Knee Exercises  alternated marching with UE on RW.        Knee/Hip Exercises: Seated   Sit to Sand  2 sets;5 reps;with UE support   daughter assits with verbal cues for momentum and hands            PT Education - 11/30/19 1243    Education Details  Family members educated on brain injury support group for community support    Person(s) Educated  Patient;Spouse;Child(ren)  Methods  Explanation    Comprehension  Verbalized understanding       PT Short Term Goals - 10/20/19 1123      PT SHORT TERM GOAL #1   Title  Patient will be educated on HEP and report regular compliance to improve safety and independence with functional mobility.    Time  4    Period  Weeks    Status  Achieved    Target Date  09/27/19        PT Long Term Goals - 11/23/19 1855      PT LONG TERM GOAL #1   Title  Patient will demonstrate ability to perform sit to stand transfer with modified independence to improve overall functional mobility.    Baseline  09/22/19- Min A, cueing for forward momentum 10/20/19: min A with verbal cueing to push off WC and shift hips anteriorly; 11/23/19: Requires Min A/ Min gaurd and min verbal cueing for momentum and hand placement    Time  5    Period   Weeks    Status  On-going    Target Date  12/21/19      PT LONG TERM GOAL #2   Title  Patient will demonstrate ability to ambulate 100 feet with LRAD with no more than minimal assistance and minimal tactile and verbal cues for form.    Baseline  11/23/19: able to perform 50 and 50 with rest break due to fatigue, using rolling walker and Min A for balance, tactile cueing for weight shifting, verbal cueing for sequencing of BLEs    Time  5    Period  Weeks    Status  On-going      PT LONG TERM GOAL #3   Title  Patient's caregivers/family will demonstrate ability to transfer and ambulate with patient with proper body mechanics and techniques for proper exercise at home.    Time  5    Period  Weeks    Status  On-going      PT LONG TERM GOAL #4   Title  Patient will demonstrate ability to correct standing posture and maintain standing balance for at least 1 minute with no more than minimal verbal cueing to improve independence with functional mobility.    Baseline  Able to do, but needs frequent cueing to avoid left leaning    Time  5    Period  Weeks    Status  On-going            Plan - 11/30/19 1233    Clinical Impression Statement  Husband and daughter present for therapy today and reports improvements followin each PT session.  Pt appears to have better return for step through gait after using recombent bike, daugther reports a bike for Christmas as family is very involved wiht therapy session.  Pt with improve weight bearing with verbal and tactile cueing for Lt LE stance phase and Rt LE advance.  Pt required min-mod A for safety with tendency to posterior lean and verbal cueing to improve awareness for balance and stance.  Pt able to verbalize appropriate hand placement prior sit<-->stand though inconsistant requiring cueing prior.  Daughter educated on tactile cueing to assist with weight bearing wiht gait.    Personal Factors and Comorbidities  Age;Comorbidity 1    Comorbidities   Hx Breast Cancer, S/P CVA    Examination-Activity Limitations  Bathing;Hygiene/Grooming;Squat;Stairs;Bend;Lift;Locomotion Level;Stand;Transfers;Dressing    Examination-Participation Restrictions  Cleaning;Shop;Laundry;Community Activity;Meal Prep    Stability/Clinical Decision Making  Evolving/Moderate complexity  Clinical Decision Making  High    Rehab Potential  Good    PT Frequency  2x / week    PT Duration  4 weeks    PT Treatment/Interventions  ADLs/Self Care Home Management;Aquatic Therapy;Cryotherapy;Electrical Stimulation;Moist Heat;DME Instruction;Gait training;Stair training;Functional mobility training;Therapeutic activities;Therapeutic exercise;Balance training;Neuromuscular re-education;Patient/family education;Manual techniques;Passive range of motion;Dry needling;Energy conservation;Taping    PT Next Visit Plan  Continue to progress gait distance, limb sequencing, and family involvement/ education on patient care    PT Home Exercise Plan  9/28: seated stepping towards sticky note targets for coordination; 10/12: gait training 3x/day before/after sleep depending on fatigue, heel raises and mini squats at countertop for UE support with family at side; 10/19: lateral scooting while seated EOB from HOB<>foot of bed    Family Member Consulted  spouse [name]), daughter ([place])       Patient will benefit from skilled therapeutic intervention in order to improve the following deficits and impairments:  Abnormal gait, Impaired sensation, Improper body mechanics, Decreased coordination, Decreased mobility, Postural dysfunction, Decreased activity tolerance, Decreased endurance, Decreased strength, Decreased balance, Decreased safety awareness, Difficulty walking, Impaired vision/preception  Visit Diagnosis: Other symptoms and signs involving the nervous system  Other lack of coordination  Other abnormalities of gait and mobility  Other symptoms and signs involving the  musculoskeletal system     Problem List Patient Active Problem List   Diagnosis Date Noted  . Acute ischemic stroke ([place]) 05/15/2019  . Acute metabolic encephalopathy [id number]  . Encephalopathy   . History of left breast cancer 05/09/2019  . Malignant neoplasm of lower-inner quadrant of left breast in female, estrogen receptor positive [facility]) 04/26/2018   Ihor Austin, [place]; [place]  Aldona Lento 11/30/2019, 12:44 PM  [place], [place], [address] Phone: [phone]   Fax:  [phone]  Name: Allysha Hodgson MRN: [id number] Date of Birth: 1941-10-13

## 2019-12-04 ENCOUNTER — Other Ambulatory Visit: Payer: Self-pay

## 2019-12-04 ENCOUNTER — Ambulatory Visit (HOSPITAL_COMMUNITY): Payer: Medicare Other | Admitting: Physical Therapy

## 2019-12-04 ENCOUNTER — Encounter (HOSPITAL_COMMUNITY): Payer: Self-pay | Admitting: Physical Therapy

## 2019-12-04 DIAGNOSIS — R2689 Other abnormalities of gait and mobility: Secondary | ICD-10-CM

## 2019-12-04 DIAGNOSIS — R29818 Other symptoms and signs involving the nervous system: Secondary | ICD-10-CM

## 2019-12-04 DIAGNOSIS — R278 Other lack of coordination: Secondary | ICD-10-CM

## 2019-12-04 DIAGNOSIS — R29898 Other symptoms and signs involving the musculoskeletal system: Secondary | ICD-10-CM

## 2019-12-04 NOTE — Therapy (Signed)
[place], [place], [address] Phone: [phone]   Fax:  [phone]  Physical Therapy Treatment  Patient Details  Name: Jessica Harrell MRN: [id number] Date of Birth: Nov 18, 1941 Referring Provider (PT): Kathryne Eriksson MD   Encounter Date: 12/04/2019  PT End of Session - 12/04/19 1134    Visit Number  32    Number of Visits  37    Date for PT Re-Evaluation  12/21/19    Authorization Type  [facility] Medicare Visits based on Medical Necessity    Authorization Time Period  08/30/19 - 10/30/19; 10/20/19-11/24/19; 11/23/19-12/21/19    Authorization - Visit Number  4    Authorization - Number of Visits  10    PT Start Time  1120    PT Stop Time  1205    PT Time Calculation (min)  45 min    Equipment Utilized During Treatment  Gait belt;Other (comment)   RW   Activity Tolerance  Patient tolerated treatment well;Patient limited by fatigue    Behavior During Therapy  [facility] for tasks assessed/performed       Past Medical History:  Diagnosis Date  . Breast cancer ([place])   . Right foot pain Nov. 2014    Past Surgical History:  Procedure Laterality Date  . BREAST BIOPSY    . BUBBLE STUDY  05/18/2019   Procedure: BUBBLE STUDY;  Surgeon: Elouise Munroe, MD;  Location: [facility] ENDOSCOPY;  Service: Cardiology;;  . CATARACT EXTRACTION     both eyes  . EYE SURGERY Left   . MASTECTOMY W/ SENTINEL NODE BIOPSY Left 05/09/2019   Procedure: LEFT MASTECTOMY WITH LEFT AXILLARY SENTINEL LYMPH NODE BIOPSY;  Surgeon: Coralie Keens, MD;  Location: [place];  Service: General;  Laterality: Left;  . TEE WITHOUT CARDIOVERSION N/A 05/18/2019   Procedure: TRANSESOPHAGEAL ECHOCARDIOGRAM (TEE);  Surgeon: Elouise Munroe, MD;  Location: [place];  Service: Cardiology;  Laterality: N/A;    There were no vitals filed for this visit.  Subjective Assessment - 12/04/19 1251    Subjective  Daughter and husband say that patient is doing well.  They say she got a stationary bike for Christmas and has been using it daily in 5 minute intervals.    Pertinent History  CVA 05/14/19    Patient Stated Goals  Walk better    Currently in Pain?  No/denies                       [facility] Adult PT Treatment/Exercise - 12/04/19 0001      Knee/Hip Exercises: Aerobic   Recumbent Bike  6 minutes, level 3   for endurance and BLE sequencing     Knee/Hip Exercises: Standing   Gait Training  50' x 3 with RW; 100' with RW; verbal and tactile cues for BLE sequencing and weight shifting   Daughter assist on 23' x1     Knee/Hip Exercises: Seated   Sit to Sand  2 sets;5 reps;with UE support               PT Short Term Goals - 10/20/19 1123      PT SHORT TERM GOAL #1   Title  Patient will be educated on HEP and report regular compliance to improve safety and independence with functional mobility.    Time  4    Period  Weeks    Status  Achieved    Target Date  09/27/19  PT Long Term Goals - 11/23/19 1855      PT LONG TERM GOAL #1   Title  Patient will demonstrate ability to perform sit to stand transfer with modified independence to improve overall functional mobility.    Baseline  09/22/19- Min A, cueing for forward momentum 10/20/19: min A with verbal cueing to push off WC and shift hips anteriorly; 11/23/19: Requires Min A/ Min gaurd and min verbal cueing for momentum and hand placement    Time  5    Period  Weeks    Status  On-going    Target Date  12/21/19      PT LONG TERM GOAL #2   Title  Patient will demonstrate ability to ambulate 100 feet with LRAD with no more than minimal assistance and minimal tactile and verbal cues for form.    Baseline  11/23/19: able to perform 50 and 50 with rest break due to fatigue, using rolling walker and Min A for balance, tactile cueing for weight shifting, verbal cueing for sequencing of BLEs    Time  5    Period  Weeks    Status  On-going      PT LONG TERM GOAL #3    Title  Patient's caregivers/family will demonstrate ability to transfer and ambulate with patient with proper body mechanics and techniques for proper exercise at home.    Time  5    Period  Weeks    Status  On-going      PT LONG TERM GOAL #4   Title  Patient will demonstrate ability to correct standing posture and maintain standing balance for at least 1 minute with no more than minimal verbal cueing to improve independence with functional mobility.    Baseline  Able to do, but needs frequent cueing to avoid left leaning    Time  5    Period  Weeks    Status  On-going            Plan - 12/04/19 1252    Clinical Impression Statement  Patient tolerated session well today. Patient with improved return for cueing with hand and foot placement with sit to stands. Patient still requires assistance and tactile cueing for getting forward momentum to obtain nose over toes. Patient did better today with maintaining standing balance, but requires min tactile cueing. Patient was able to progress ambulation distance and reps today with improved return for BLE sequencing and stride length. Educated patients daughter on hand placement for weight shifting at hips to facilitate lower limb advancement during gait training.    Personal Factors and Comorbidities  Age;Comorbidity 1    Comorbidities  Hx Breast Cancer, S/P CVA    Examination-Activity Limitations  Bathing;Hygiene/Grooming;Squat;Stairs;Bend;Lift;Locomotion Level;Stand;Transfers;Dressing    Examination-Participation Restrictions  Cleaning;Shop;Laundry;Community Activity;Meal Prep    Stability/Clinical Decision Making  Evolving/Moderate complexity    Rehab Potential  Good    PT Frequency  2x / week    PT Duration  4 weeks    PT Treatment/Interventions  ADLs/Self Care Home Management;Aquatic Therapy;Cryotherapy;Electrical Stimulation;Moist Heat;DME Instruction;Gait training;Stair training;Functional mobility training;Therapeutic  activities;Therapeutic exercise;Balance training;Neuromuscular re-education;Patient/family education;Manual techniques;Passive range of motion;Dry needling;Energy conservation;Taping    PT Next Visit Plan  Continue to progress gait distance, limb sequencing, and family involvement/ education on patient care    PT Home Exercise Plan  9/28: seated stepping towards sticky note targets for coordination; 10/12: gait training 3x/day before/after sleep depending on fatigue, heel raises and mini squats at countertop for UE support with family  at side; 10/19: lateral scooting while seated EOB from HOB<>foot of bed    Family Member Consulted  spouse [name]), daughter ([place])       Patient will benefit from skilled therapeutic intervention in order to improve the following deficits and impairments:  Abnormal gait, Impaired sensation, Improper body mechanics, Decreased coordination, Decreased mobility, Postural dysfunction, Decreased activity tolerance, Decreased endurance, Decreased strength, Decreased balance, Decreased safety awareness, Difficulty walking, Impaired vision/preception  Visit Diagnosis: Other symptoms and signs involving the nervous system  Other lack of coordination  Other abnormalities of gait and mobility  Other symptoms and signs involving the musculoskeletal system     Problem List Patient Active Problem List   Diagnosis Date Noted  . Acute ischemic stroke ([place]) 05/15/2019  . Acute metabolic encephalopathy [id number]  . Encephalopathy   . History of left breast cancer 05/09/2019  . Malignant neoplasm of lower-inner quadrant of left breast in female, estrogen receptor positive ([place]) 04/26/2018   1:01 PM, 12/04/19 Josue Hector PT DPT  Physical Therapist with [facility]  [phone] [place] [place], [place], [address] Phone: [phone]   Fax:  [phone]  Name: Happiness Maslin MRN: [id number] Date of Birth: Jun 14, 1941

## 2019-12-07 ENCOUNTER — Encounter (HOSPITAL_COMMUNITY): Payer: Self-pay | Admitting: Physical Therapy

## 2019-12-07 ENCOUNTER — Ambulatory Visit (HOSPITAL_COMMUNITY): Payer: Medicare Other | Admitting: Physical Therapy

## 2019-12-07 ENCOUNTER — Other Ambulatory Visit: Payer: Self-pay

## 2019-12-07 DIAGNOSIS — R278 Other lack of coordination: Secondary | ICD-10-CM

## 2019-12-07 DIAGNOSIS — R29818 Other symptoms and signs involving the nervous system: Secondary | ICD-10-CM

## 2019-12-07 DIAGNOSIS — R2689 Other abnormalities of gait and mobility: Secondary | ICD-10-CM

## 2019-12-07 DIAGNOSIS — R29898 Other symptoms and signs involving the musculoskeletal system: Secondary | ICD-10-CM

## 2019-12-07 NOTE — Therapy (Signed)
[place], [place], [address] Phone: [phone]   Fax:  [phone]  Physical Therapy Treatment  Patient Details  Name: Jessica Harrell MRN: [id number] Date of Birth: 1941/02/16 Referring Provider (PT): Kathryne Eriksson MD   Encounter Date: 12/07/2019  PT End of Session - 12/07/19 1053    Visit Number  33    Number of Visits  37    Date for PT Re-Evaluation  12/21/19    Authorization Type  [facility] Medicare Visits based on Medical Necessity    Authorization Time Period  08/30/19 - 10/30/19; 10/20/19-11/24/19; 11/23/19-12/21/19    Authorization - Visit Number  5    Authorization - Number of Visits  10    PT Start Time  0945    PT Stop Time  1030    PT Time Calculation (min)  45 min    Equipment Utilized During Treatment  Gait belt;Other (comment)   RW   Activity Tolerance  Patient tolerated treatment well;Patient limited by fatigue    Behavior During Therapy  [facility] for tasks assessed/performed       Past Medical History:  Diagnosis Date  . Breast cancer ([place])   . Right foot pain Nov. 2014    Past Surgical History:  Procedure Laterality Date  . BREAST BIOPSY    . BUBBLE STUDY  05/18/2019   Procedure: BUBBLE STUDY;  Surgeon: Elouise Munroe, MD;  Location: [facility] ENDOSCOPY;  Service: Cardiology;;  . CATARACT EXTRACTION     both eyes  . EYE SURGERY Left   . MASTECTOMY W/ SENTINEL NODE BIOPSY Left 05/09/2019   Procedure: LEFT MASTECTOMY WITH LEFT AXILLARY SENTINEL LYMPH NODE BIOPSY;  Surgeon: Coralie Keens, MD;  Location: [place];  Service: General;  Laterality: Left;  . TEE WITHOUT CARDIOVERSION N/A 05/18/2019   Procedure: TRANSESOPHAGEAL ECHOCARDIOGRAM (TEE);  Surgeon: Elouise Munroe, MD;  Location: [place];  Service: Cardiology;  Laterality: N/A;    There were no vitals filed for this visit.  Subjective Assessment - 12/07/19 1052    Subjective  Daughter and husband report patient had an accident last  night. They say she fell out of bed and hit her hand. Daughter said patient reported hand was sore but it is not bothering her now. She says she checked it and did not see any cause for concern.    Pertinent History  CVA 05/14/19    Patient Stated Goals  Walk better    Currently in Pain?  No/denies                       [facility] Adult PT Treatment/Exercise - 12/07/19 0001      Knee/Hip Exercises: Aerobic   Recumbent Bike  8 minutes, level 3      Knee/Hip Exercises: Standing   Hip Abduction  Both;2 sets;10 reps    Abduction Limitations  at //    Gait Training  100'; 50' x 2 using RW, verbal and tactile cues for limb advancement and weight shifting      Knee/Hip Exercises: Seated   Marching  Right;15 reps    Sit to Sand  2 sets;5 reps;with UE support             PT Education - 12/07/19 1101    Education Details  Educated on continued cueing for sequencing of sit to stands and how to better gaurd and cue for weightshifitng during ambulation. Talked about possible use of bed rail  or alarm clock set up to reduce frequency of patient falling out of bed.    Person(s) Educated  Patient;Spouse;Child(ren)    Methods  Explanation    Comprehension  Verbalized understanding       PT Short Term Goals - 10/20/19 1123      PT SHORT TERM GOAL #1   Title  Patient will be educated on HEP and report regular compliance to improve safety and independence with functional mobility.    Time  4    Period  Weeks    Status  Achieved    Target Date  09/27/19        PT Long Term Goals - 11/23/19 1855      PT LONG TERM GOAL #1   Title  Patient will demonstrate ability to perform sit to stand transfer with modified independence to improve overall functional mobility.    Baseline  09/22/19- Min A, cueing for forward momentum 10/20/19: min A with verbal cueing to push off WC and shift hips anteriorly; 11/23/19: Requires Min A/ Min gaurd and min verbal cueing for momentum and hand  placement    Time  5    Period  Weeks    Status  On-going    Target Date  12/21/19      PT LONG TERM GOAL #2   Title  Patient will demonstrate ability to ambulate 100 feet with LRAD with no more than minimal assistance and minimal tactile and verbal cues for form.    Baseline  11/23/19: able to perform 50 and 50 with rest break due to fatigue, using rolling walker and Min A for balance, tactile cueing for weight shifting, verbal cueing for sequencing of BLEs    Time  5    Period  Weeks    Status  On-going      PT LONG TERM GOAL #3   Title  Patient's caregivers/family will demonstrate ability to transfer and ambulate with patient with proper body mechanics and techniques for proper exercise at home.    Time  5    Period  Weeks    Status  On-going      PT LONG TERM GOAL #4   Title  Patient will demonstrate ability to correct standing posture and maintain standing balance for at least 1 minute with no more than minimal verbal cueing to improve independence with functional mobility.    Baseline  Able to do, but needs frequent cueing to avoid left leaning    Time  5    Period  Weeks    Status  On-going            Plan - 12/07/19 1053    Clinical Impression Statement  Patient was able to perform longer on recumbent bike for endurance today but had more difficulty lifting RLE during gait training. Patient required max verbal cues for lifting and advancing RLE in step through pattern. Patient continues to have trouble with initiating balance upon standing as she still tends to lean backward and is unable to correct with verbal cueing. For this patient requires Min A about 60-70% of the time, but once centered does fairly well. Patient tolerated added standing hip abduction at parallel bars well, but required cueing for abduction RLE instead of flexing.    Personal Factors and Comorbidities  Age;Comorbidity 1    Comorbidities  Hx Breast Cancer, S/P CVA    Examination-Activity Limitations   Bathing;Hygiene/Grooming;Squat;Stairs;Bend;Lift;Locomotion Level;Stand;Transfers;Dressing    Examination-Participation Restrictions  Cleaning;Shop;Laundry;Community Activity;Meal Prep  Stability/Clinical Decision Making  Evolving/Moderate complexity    Rehab Potential  Good    PT Frequency  2x / week    PT Duration  4 weeks    PT Treatment/Interventions  ADLs/Self Care Home Management;Aquatic Therapy;Cryotherapy;Electrical Stimulation;Moist Heat;DME Instruction;Gait training;Stair training;Functional mobility training;Therapeutic activities;Therapeutic exercise;Balance training;Neuromuscular re-education;Patient/family education;Manual techniques;Passive range of motion;Dry needling;Energy conservation;Taping    PT Next Visit Plan  Continue to progress gait distance, limb sequencing, and family involvement/ education on patient care    PT Home Exercise Plan  9/28: seated stepping towards sticky note targets for coordination; 10/12: gait training 3x/day before/after sleep depending on fatigue, heel raises and mini squats at countertop for UE support with family at side; 10/19: lateral scooting while seated EOB from HOB<>foot of bed    Family Member Consulted  spouse [name]), daughter ([place])       Patient will benefit from skilled therapeutic intervention in order to improve the following deficits and impairments:  Abnormal gait, Impaired sensation, Improper body mechanics, Decreased coordination, Decreased mobility, Postural dysfunction, Decreased activity tolerance, Decreased endurance, Decreased strength, Decreased balance, Decreased safety awareness, Difficulty walking, Impaired vision/preception  Visit Diagnosis: Other symptoms and signs involving the nervous system  Other lack of coordination  Other abnormalities of gait and mobility  Other symptoms and signs involving the musculoskeletal system     Problem List Patient Active Problem List   Diagnosis Date Noted  . Acute  ischemic stroke ([place]) 05/15/2019  . Acute metabolic encephalopathy [id number]  . Encephalopathy   . History of left breast cancer 05/09/2019  . Malignant neoplasm of lower-inner quadrant of left breast in female, estrogen receptor positive ([place]) 04/26/2018   11:03 AM, 12/07/19 Josue Hector PT DPT  Physical Therapist with [facility]  [phone] [place], [place], [address] Phone: [phone]   Fax:  [phone]  Name: Jessica Harrell MRN: [id number] Date of Birth: 08/06/1941

## 2019-12-12 ENCOUNTER — Other Ambulatory Visit: Payer: Self-pay

## 2019-12-12 ENCOUNTER — Ambulatory Visit (HOSPITAL_COMMUNITY): Payer: Medicare Other | Attending: Family Medicine | Admitting: Physical Therapy

## 2019-12-12 ENCOUNTER — Encounter (HOSPITAL_COMMUNITY): Payer: Self-pay | Admitting: Physical Therapy

## 2019-12-12 DIAGNOSIS — R2689 Other abnormalities of gait and mobility: Secondary | ICD-10-CM | POA: Diagnosis present

## 2019-12-12 DIAGNOSIS — R29898 Other symptoms and signs involving the musculoskeletal system: Secondary | ICD-10-CM | POA: Insufficient documentation

## 2019-12-12 DIAGNOSIS — R278 Other lack of coordination: Secondary | ICD-10-CM | POA: Diagnosis present

## 2019-12-12 DIAGNOSIS — R29818 Other symptoms and signs involving the nervous system: Secondary | ICD-10-CM | POA: Diagnosis not present

## 2019-12-12 NOTE — Therapy (Signed)
[place], [place], [address] Phone: [phone]   Fax:  [phone]  Physical Therapy Treatment  Patient Details  Name: Jessica Harrell MRN: [id number] Date of Birth: 04/08/1941 Referring Provider (PT): Kathryne Eriksson MD   Encounter Date: 12/12/2019  PT End of Session - 12/12/19 1643    Visit Number  34    Number of Visits  37    Date for PT Re-Evaluation  12/21/19    Authorization Type  [facility] Medicare Visits based on Medical Necessity    Authorization Time Period  08/30/19 - 10/30/19; 10/20/19-11/24/19; 11/23/19-12/21/19    Authorization - Visit Number  6    Authorization - Number of Visits  10    PT Start Time  [id number]    PT Stop Time  1430    PT Time Calculation (min)  45 min    Equipment Utilized During Treatment  Gait belt;Other (comment)   RW   Activity Tolerance  Patient tolerated treatment well;Patient limited by fatigue    Behavior During Therapy  [facility] for tasks assessed/performed       Past Medical History:  Diagnosis Date  . Breast cancer ([place])   . Right foot pain Nov. 2014    Past Surgical History:  Procedure Laterality Date  . BREAST BIOPSY    . BUBBLE STUDY  05/18/2019   Procedure: BUBBLE STUDY;  Surgeon: Elouise Munroe, MD;  Location: [facility] ENDOSCOPY;  Service: Cardiology;;  . CATARACT EXTRACTION     both eyes  . EYE SURGERY Left   . MASTECTOMY W/ SENTINEL NODE BIOPSY Left 05/09/2019   Procedure: LEFT MASTECTOMY WITH LEFT AXILLARY SENTINEL LYMPH NODE BIOPSY;  Surgeon: Coralie Keens, MD;  Location: [place];  Service: General;  Laterality: Left;  . TEE WITHOUT CARDIOVERSION N/A 05/18/2019   Procedure: TRANSESOPHAGEAL ECHOCARDIOGRAM (TEE);  Surgeon: Elouise Munroe, MD;  Location: [place];  Service: Cardiology;  Laterality: N/A;    There were no vitals filed for this visit.  Subjective Assessment - 12/12/19 1638    Subjective  Daughter and husband report patient fell out of bed  yesterday trying to get her shoes. They deny any resulting injury, and no reports of pain currently.    Pertinent History  CVA 05/14/19    Patient Stated Goals  Walk better    Currently in Pain?  No/denies                       [facility] Adult PT Treatment/Exercise - 12/12/19 0001      Knee/Hip Exercises: Aerobic   Recumbent Bike  5 min warm up level 3      Knee/Hip Exercises: Standing   Gait Training  50' x 3 with RW; 100' with RW; verbal and tactile cues for BLE sequencing and weight shifting   Daughter assist on 60' x1   Other Standing Knee Exercises  standing balance with RW; CG/ Min guard, up to 30 seconds x3      Knee/Hip Exercises: Seated   Sit to Sand  2 sets;5 reps;with UE support             PT Education - 12/12/19 1639    Education Details  Educated patient and family members on upcoming discharge and after plan. Talked with them to follow up with referring provider about setting up some form of home care after outpatient therapy ends. Talked about structuring daily activity to better suit patient's needs, scheduling time  that husband can help vs when daughter can help vs when nurse aid or home health assistant can help to improve patient function at home and reduce risk for falls. Educated patient and family members or potentially putting up a bed rail to reduce patient falling out of bed, or to put mat or soft cushion at bedside to reduce risk for injury.    Person(s) Educated  Patient;Spouse;Child(ren)    Methods  Explanation    Comprehension  Verbalized understanding       PT Short Term Goals - 10/20/19 1123      PT SHORT TERM GOAL #1   Title  Patient will be educated on HEP and report regular compliance to improve safety and independence with functional mobility.    Time  4    Period  Weeks    Status  Achieved    Target Date  09/27/19        PT Long Term Goals - 11/23/19 1855      PT LONG TERM GOAL #1   Title  Patient will demonstrate  ability to perform sit to stand transfer with modified independence to improve overall functional mobility.    Baseline  09/22/19- Min A, cueing for forward momentum 10/20/19: min A with verbal cueing to push off WC and shift hips anteriorly; 11/23/19: Requires Min A/ Min gaurd and min verbal cueing for momentum and hand placement    Time  5    Period  Weeks    Status  On-going    Target Date  12/21/19      PT LONG TERM GOAL #2   Title  Patient will demonstrate ability to ambulate 100 feet with LRAD with no more than minimal assistance and minimal tactile and verbal cues for form.    Baseline  11/23/19: able to perform 50 and 50 with rest break due to fatigue, using rolling walker and Min A for balance, tactile cueing for weight shifting, verbal cueing for sequencing of BLEs    Time  5    Period  Weeks    Status  On-going      PT LONG TERM GOAL #3   Title  Patient's caregivers/family will demonstrate ability to transfer and ambulate with patient with proper body mechanics and techniques for proper exercise at home.    Time  5    Period  Weeks    Status  On-going      PT LONG TERM GOAL #4   Title  Patient will demonstrate ability to correct standing posture and maintain standing balance for at least 1 minute with no more than minimal verbal cueing to improve independence with functional mobility.    Baseline  Able to do, but needs frequent cueing to avoid left leaning    Time  5    Period  Weeks    Status  On-going            Plan - 12/12/19 1643    Clinical Impression Statement  Spent significant time educating patient and family members on plan for DC next week. Educated them on devising plan to schedule times for family and assistants to help patient to better suit her needs at home, also discussed methods for reducing frequency of patient falling out of bed. Patient shows overall improvement in return for cueing on sit to stands, and to some degree with sequencing steps during  gait training, but continues to demo tendency for leaning backward, most notably upon standing. Patient was able to  improve static standing balance duration once positioned within base of support.    Personal Factors and Comorbidities  Age;Comorbidity 1    Comorbidities  Hx Breast Cancer, S/P CVA    Examination-Activity Limitations  Bathing;Hygiene/Grooming;Squat;Stairs;Bend;Lift;Locomotion Level;Stand;Transfers;Dressing    Examination-Participation Restrictions  Cleaning;Shop;Laundry;Community Activity;Meal Prep    Stability/Clinical Decision Making  Evolving/Moderate complexity    Rehab Potential  Good    PT Frequency  2x / week    PT Duration  4 weeks    PT Treatment/Interventions  ADLs/Self Care Home Management;Aquatic Therapy;Cryotherapy;Electrical Stimulation;Moist Heat;DME Instruction;Gait training;Stair training;Functional mobility training;Therapeutic activities;Therapeutic exercise;Balance training;Neuromuscular re-education;Patient/family education;Manual techniques;Passive range of motion;Dry needling;Energy conservation;Taping    PT Next Visit Plan  Continue to progress gait distance, limb sequencing, and family involvement/ education on patient care    PT Home Exercise Plan  9/28: seated stepping towards sticky note targets for coordination; 10/12: gait training 3x/day before/after sleep depending on fatigue, heel raises and mini squats at countertop for UE support with family at side; 10/19: lateral scooting while seated EOB from HOB<>foot of bed    Family Member Consulted  spouse [name]), daughter ([place])       Patient will benefit from skilled therapeutic intervention in order to improve the following deficits and impairments:  Abnormal gait, Impaired sensation, Improper body mechanics, Decreased coordination, Decreased mobility, Postural dysfunction, Decreased activity tolerance, Decreased endurance, Decreased strength, Decreased balance, Decreased safety awareness, Difficulty  walking, Impaired vision/preception  Visit Diagnosis: Other symptoms and signs involving the nervous system  Other lack of coordination  Other abnormalities of gait and mobility  Other symptoms and signs involving the musculoskeletal system     Problem List Patient Active Problem List   Diagnosis Date Noted  . Acute ischemic stroke ([place]) 05/15/2019  . Acute metabolic encephalopathy [id number]  . Encephalopathy   . History of left breast cancer 05/09/2019  . Malignant neoplasm of lower-inner quadrant of left breast in female, estrogen receptor positive ([place]) 04/26/2018    6:32 PM, 12/12/19 Josue Hector PT DPT  Physical Therapist with [facility]  [phone] [place], [place], [address] Phone: [phone]   Fax:  [phone]  Name: Jessica Harrell MRN: [id number] Date of Birth: 08/31/1941

## 2019-12-14 ENCOUNTER — Other Ambulatory Visit: Payer: Self-pay

## 2019-12-14 ENCOUNTER — Ambulatory Visit (HOSPITAL_COMMUNITY): Payer: Medicare Other | Admitting: Physical Therapy

## 2019-12-14 ENCOUNTER — Encounter (HOSPITAL_COMMUNITY): Payer: Self-pay | Admitting: Physical Therapy

## 2019-12-14 DIAGNOSIS — R29818 Other symptoms and signs involving the nervous system: Secondary | ICD-10-CM

## 2019-12-14 DIAGNOSIS — R278 Other lack of coordination: Secondary | ICD-10-CM

## 2019-12-14 DIAGNOSIS — R2689 Other abnormalities of gait and mobility: Secondary | ICD-10-CM

## 2019-12-14 DIAGNOSIS — R29898 Other symptoms and signs involving the musculoskeletal system: Secondary | ICD-10-CM

## 2019-12-14 NOTE — Therapy (Signed)
[place], [place], [address] Phone: [phone]   Fax:  [phone]  Physical Therapy Treatment  Patient Details  Name: Jessica Harrell MRN: [id number] Date of Birth: Jan 29, 1941 Referring Provider (PT): Kathryne Eriksson MD   Encounter Date: 12/14/2019  PT End of Session - 12/14/19 1544    Visit Number  35    Number of Visits  37    Date for PT Re-Evaluation  12/21/19    Authorization Type  [facility] Medicare Visits based on Medical Necessity    Authorization Time Period  08/30/19 - 10/30/19; 10/20/19-11/24/19; 11/23/19-12/21/19    Authorization - Visit Number  7    Authorization - Number of Visits  10    PT Start Time  1430    PT Stop Time  1515    PT Time Calculation (min)  45 min    Equipment Utilized During Treatment  Gait belt;Other (comment)   RW   Activity Tolerance  Patient tolerated treatment well;Patient limited by fatigue    Behavior During Therapy  [facility] for tasks assessed/performed       Past Medical History:  Diagnosis Date  . Breast cancer ([place])   . Right foot pain Nov. 2014    Past Surgical History:  Procedure Laterality Date  . BREAST BIOPSY    . BUBBLE STUDY  05/18/2019   Procedure: BUBBLE STUDY;  Surgeon: Elouise Munroe, MD;  [facility] ENDOSCOPY;  Service: Cardiology;;  . CATARACT EXTRACTION     both eyes  . EYE SURGERY Left   . MASTECTOMY W/ SENTINEL NODE BIOPSY Left 05/09/2019   Procedure: LEFT MASTECTOMY WITH LEFT AXILLARY SENTINEL LYMPH NODE BIOPSY;  Surgeon: Coralie Keens, MD;  Location: [place];  Service: General;  Laterality: Left;  . TEE WITHOUT CARDIOVERSION N/A 05/18/2019   Procedure: TRANSESOPHAGEAL ECHOCARDIOGRAM (TEE);  Surgeon: Elouise Munroe, MD;  Location: [place];  Service: Cardiology;  Laterality: N/A;    There were no vitals filed for this visit.  Subjective Assessment - 12/14/19 1436    Subjective  Patient and family members report no new issues today.    Pertinent History  CVA 05/14/19    Patient Stated Goals  Walk better    Currently in Pain?  No/denies                       [facility] Adult PT Treatment/Exercise - 12/14/19 0001      Knee/Hip Exercises: Aerobic   Recumbent Bike  5 min warm up level 3      Knee/Hip Exercises: Standing   Gait Training  50' x 3 with RW; verbal and tactile cues for BLE sequencing and weight shifting    Other Standing Knee Exercises  standing balance with RW; CG/ Min guard,5 rounds, up to 40 sec      Knee/Hip Exercises: Seated   Sit to Sand  2 sets;5 reps;with UE support               PT Short Term Goals - 10/20/19 1123      PT SHORT TERM GOAL #1   Title  Patient will be educated on HEP and report regular compliance to improve safety and independence with functional mobility.    Time  4    Period  Weeks    Status  Achieved    Target Date  09/27/19        PT Long Term Goals - 11/23/19 1855  PT LONG TERM GOAL #1   Title  Patient will demonstrate ability to perform sit to stand transfer with modified independence to improve overall functional mobility.    Baseline  09/22/19- Min A, cueing for forward momentum 10/20/19: min A with verbal cueing to push off WC and shift hips anteriorly; 11/23/19: Requires Min A/ Min gaurd and min verbal cueing for momentum and hand placement    Time  5    Period  Weeks    Status  On-going    Target Date  12/21/19      PT LONG TERM GOAL #2   Title  Patient will demonstrate ability to ambulate 100 feet with LRAD with no more than minimal assistance and minimal tactile and verbal cues for form.    Baseline  11/23/19: able to perform 50 and 50 with rest break due to fatigue, using rolling walker and Min A for balance, tactile cueing for weight shifting, verbal cueing for sequencing of BLEs    Time  5    Period  Weeks    Status  On-going      PT LONG TERM GOAL #3   Title  Patient's caregivers/family will demonstrate ability to transfer and  ambulate with patient with proper body mechanics and techniques for proper exercise at home.    Time  5    Period  Weeks    Status  On-going      PT LONG TERM GOAL #4   Title  Patient will demonstrate ability to correct standing posture and maintain standing balance for at least 1 minute with no more than minimal verbal cueing to improve independence with functional mobility.    Baseline  Able to do, but needs frequent cueing to avoid left leaning    Time  5    Period  Weeks    Status  On-going            Plan - 12/14/19 1544    Clinical Impression Statement  Patient was less steady with gait training today, displays much more tendency to lean RT and posteriorly. Patient required mod to max assist today with gait training and had LOB x 1 which has corrected by therapist assist. Educated and instructed patient husband on transfer to and from recumbent bike, as he says he has been having trouble helping patient with this on the bike they have at home. Instructed him on cueing for hand placement and foot placement, as well as his hand placement for guarding patient. Had patient's husband practice cueing patient and providing tactile cueing for sit to stands, and provided therapist assist. Patent was able to improve standing static balance today, despite being quite unsteady during gait, possibly due to increased fatigue today.    Personal Factors and Comorbidities  Age;Comorbidity 1    Comorbidities  Hx Breast Cancer, S/P CVA    Examination-Activity Limitations  Bathing;Hygiene/Grooming;Squat;Stairs;Bend;Lift;Locomotion Level;Stand;Transfers;Dressing    Examination-Participation Restrictions  Cleaning;Shop;Laundry;Community Activity;Meal Prep    Stability/Clinical Decision Making  Evolving/Moderate complexity    Rehab Potential  Good    PT Frequency  2x / week    PT Duration  4 weeks    PT Treatment/Interventions  ADLs/Self Care Home Management;Aquatic Therapy;Cryotherapy;Electrical  Stimulation;Moist Heat;DME Instruction;Gait training;Stair training;Functional mobility training;Therapeutic activities;Therapeutic exercise;Balance training;Neuromuscular re-education;Patient/family education;Manual techniques;Passive range of motion;Dry needling;Energy conservation;Taping    PT Next Visit Plan  Continue to progress gait distance, limb sequencing, and family involvement/ education on patient care    PT Home Exercise Plan  9/28: seated  stepping towards sticky note targets for coordination; 10/12: gait training 3x/day before/after sleep depending on fatigue, heel raises and mini squats at countertop for UE support with family at side; 10/19: lateral scooting while seated EOB from HOB<>foot of bed    Family Member Consulted  spouse [name]), daughter ([place])       Patient will benefit from skilled therapeutic intervention in order to improve the following deficits and impairments:  Abnormal gait, Impaired sensation, Improper body mechanics, Decreased coordination, Decreased mobility, Postural dysfunction, Decreased activity tolerance, Decreased endurance, Decreased strength, Decreased balance, Decreased safety awareness, Difficulty walking, Impaired vision/preception  Visit Diagnosis: Other symptoms and signs involving the nervous system  Other lack of coordination  Other abnormalities of gait and mobility  Other symptoms and signs involving the musculoskeletal system     Problem List Patient Active Problem List   Diagnosis Date Noted  . Acute ischemic stroke ([place]) 05/15/2019  . Acute metabolic encephalopathy [id number]  . Encephalopathy   . History of left breast cancer 05/09/2019  . Malignant neoplasm of lower-inner quadrant of left breast in female, estrogen receptor positive ([place]) 04/26/2018   3:53 PM, 12/14/19 Josue Hector PT DPT  Physical Therapist with [facility]  [phone] [place], [place], [address] Phone: [phone]   Fax:  [phone]  Name: Jessica Harrell MRN: [id number] Date of Birth: 1941/02/19

## 2019-12-19 ENCOUNTER — Ambulatory Visit (HOSPITAL_COMMUNITY): Payer: Medicare Other | Admitting: Physical Therapy

## 2019-12-19 ENCOUNTER — Encounter (HOSPITAL_COMMUNITY): Payer: Self-pay | Admitting: Physical Therapy

## 2019-12-19 ENCOUNTER — Other Ambulatory Visit: Payer: Self-pay

## 2019-12-19 DIAGNOSIS — R278 Other lack of coordination: Secondary | ICD-10-CM

## 2019-12-19 DIAGNOSIS — R29818 Other symptoms and signs involving the nervous system: Secondary | ICD-10-CM

## 2019-12-19 DIAGNOSIS — R29898 Other symptoms and signs involving the musculoskeletal system: Secondary | ICD-10-CM

## 2019-12-19 DIAGNOSIS — R2689 Other abnormalities of gait and mobility: Secondary | ICD-10-CM

## 2019-12-19 NOTE — Therapy (Signed)
[place], [place], [address] Phone: [phone]   Fax:  [phone]  Physical Therapy Treatment  Patient Details  Name: Jessica Harrell MRN: [id number] Date of Birth: 09/14/1941 Referring Provider (PT): Kathryne Eriksson MD   Encounter Date: 12/19/2019  PT End of Session - 12/19/19 1311    Visit Number  36    Number of Visits  37    Date for PT Re-Evaluation  12/21/19    Authorization Type  [facility] Medicare Visits based on Medical Necessity    Authorization Time Period  08/30/19 - 10/30/19; 10/20/19-11/24/19; 11/23/19-12/21/19    Authorization - Visit Number  8    Authorization - Number of Visits  10    PT Start Time  1302    PT Stop Time  1345    PT Time Calculation (min)  43 min    Equipment Utilized During Treatment  Gait belt;Other (comment)   RW   Activity Tolerance  Patient tolerated treatment well;Patient limited by fatigue    Behavior During Therapy  [facility] for tasks assessed/performed       Past Medical History:  Diagnosis Date  . Breast cancer ([place])   . Right foot pain Nov. 2014    Past Surgical History:  Procedure Laterality Date  . BREAST BIOPSY    . BUBBLE STUDY  05/18/2019   Procedure: BUBBLE STUDY;  Surgeon: Elouise Munroe, MD;  Location: [facility] ENDOSCOPY;  Service: Cardiology;;  . CATARACT EXTRACTION     both eyes  . EYE SURGERY Left   . MASTECTOMY W/ SENTINEL NODE BIOPSY Left 05/09/2019   Procedure: LEFT MASTECTOMY WITH LEFT AXILLARY SENTINEL LYMPH NODE BIOPSY;  Surgeon: Coralie Keens, MD;  Location: [place];  Service: General;  Laterality: Left;  . TEE WITHOUT CARDIOVERSION N/A 05/18/2019   Procedure: TRANSESOPHAGEAL ECHOCARDIOGRAM (TEE);  Surgeon: Elouise Munroe, MD;  Location: [place];  Service: Cardiology;  Laterality: N/A;    There were no vitals filed for this visit.  Subjective Assessment - 12/19/19 1309    Subjective  Patient and family say pateint is doing well, but husband  says that he fell when trying to assist pateint onto commode. He has bruise on forearm but reports no injury otherwise.    Pertinent History  CVA 05/14/19    Patient Stated Goals  Walk better    Currently in Pain?  No/denies                       [facility] Adult PT Treatment/Exercise - 12/19/19 0001      Knee/Hip Exercises: Aerobic   Recumbent Bike  5 min warm up level 3      Knee/Hip Exercises: Standing   Hip Abduction  Both;2 sets;10 reps    Abduction Limitations  at //    Gait Training  50' x 2 with RW; verbal and tactile cues for BLE sequencing and weight shifting    Other Standing Knee Exercises  standing balance with RW; CG/ Min guard,5 rounds, up to 60 sec    Other Standing Knee Exercises  alternated marching with UE on RW x20      Knee/Hip Exercises: Seated   Sit to Sand  2 sets;5 reps;with UE support               PT Short Term Goals - 10/20/19 1123      PT SHORT TERM GOAL #1   Title  Patient will be educated  on HEP and report regular compliance to improve safety and independence with functional mobility.    Time  4    Period  Weeks    Status  Achieved    Target Date  09/27/19        PT Long Term Goals - 11/23/19 1855      PT LONG TERM GOAL #1   Title  Patient will demonstrate ability to perform sit to stand transfer with modified independence to improve overall functional mobility.    Baseline  09/22/19- Min A, cueing for forward momentum 10/20/19: min A with verbal cueing to push off WC and shift hips anteriorly; 11/23/19: Requires Min A/ Min gaurd and min verbal cueing for momentum and hand placement    Time  5    Period  Weeks    Status  On-going    Target Date  12/21/19      PT LONG TERM GOAL #2   Title  Patient will demonstrate ability to ambulate 100 feet with LRAD with no more than minimal assistance and minimal tactile and verbal cues for form.    Baseline  11/23/19: able to perform 50 and 50 with rest break due to fatigue, using  rolling walker and Min A for balance, tactile cueing for weight shifting, verbal cueing for sequencing of BLEs    Time  5    Period  Weeks    Status  On-going      PT LONG TERM GOAL #3   Title  Patient's caregivers/family will demonstrate ability to transfer and ambulate with patient with proper body mechanics and techniques for proper exercise at home.    Time  5    Period  Weeks    Status  On-going      PT LONG TERM GOAL #4   Title  Patient will demonstrate ability to correct standing posture and maintain standing balance for at least 1 minute with no more than minimal verbal cueing to improve independence with functional mobility.    Baseline  Able to do, but needs frequent cueing to avoid left leaning    Time  5    Period  Weeks    Status  On-going            Plan - 12/19/19 1357    Clinical Impression Statement  Patient continues to have increased difficulty with steadiness during gait training, despite improved static standing time. Patient seemed to have more trouble with coordinating lower limbs, and required max verbal cueing for foot placement, and sequencing. Continued to work with family members today on verbal and tactile cues for transfers and gait training. Educated family members on scheduling follow up with referring MD after DC from therapy next visit.    Personal Factors and Comorbidities  Age;Comorbidity 1    Comorbidities  Hx Breast Cancer, S/P CVA    Examination-Activity Limitations  Bathing;Hygiene/Grooming;Squat;Stairs;Bend;Lift;Locomotion Level;Stand;Transfers;Dressing    Examination-Participation Restrictions  Cleaning;Shop;Laundry;Community Activity;Meal Prep    Stability/Clinical Decision Making  Evolving/Moderate complexity    Rehab Potential  Good    PT Frequency  2x / week    PT Duration  4 weeks    PT Treatment/Interventions  ADLs/Self Care Home Management;Aquatic Therapy;Cryotherapy;Electrical Stimulation;Moist Heat;DME Instruction;Gait  training;Stair training;Functional mobility training;Therapeutic activities;Therapeutic exercise;Balance training;Neuromuscular re-education;Patient/family education;Manual techniques;Passive range of motion;Dry needling;Energy conservation;Taping    PT Next Visit Plan  Reassess next visit, DC, transition to home care.    PT Home Exercise Plan  9/28: seated stepping towards sticky note targets for  coordination; 10/12: gait training 3x/day before/after sleep depending on fatigue, heel raises and mini squats at countertop for UE support with family at side; 10/19: lateral scooting while seated EOB from HOB<>foot of bed    Family Member Consulted  spouse [name]), daughter ([place])       Patient will benefit from skilled therapeutic intervention in order to improve the following deficits and impairments:  Abnormal gait, Impaired sensation, Improper body mechanics, Decreased coordination, Decreased mobility, Postural dysfunction, Decreased activity tolerance, Decreased endurance, Decreased strength, Decreased balance, Decreased safety awareness, Difficulty walking, Impaired vision/preception  Visit Diagnosis: Other symptoms and signs involving the nervous system  Other lack of coordination  Other abnormalities of gait and mobility  Other symptoms and signs involving the musculoskeletal system     Problem List Patient Active Problem List   Diagnosis Date Noted  . Acute ischemic stroke ([place]) 05/15/2019  . Acute metabolic encephalopathy [id number]  . Encephalopathy   . History of left breast cancer 05/09/2019  . Malignant neoplasm of lower-inner quadrant of left breast in female, estrogen receptor positive ([place]) 04/26/2018   2:21 PM, 12/19/19 Josue Hector PT DPT  Physical Therapist with [facility]  [phone] [place], [place], [address] Phone: [phone]   Fax:  [phone]  Name:  Jessica Harrell MRN: [id number] Date of Birth: 1941/05/05

## 2019-12-21 ENCOUNTER — Other Ambulatory Visit: Payer: Self-pay

## 2019-12-21 ENCOUNTER — Encounter (HOSPITAL_COMMUNITY): Payer: Self-pay | Admitting: Physical Therapy

## 2019-12-21 ENCOUNTER — Ambulatory Visit (HOSPITAL_COMMUNITY): Payer: Medicare Other | Admitting: Physical Therapy

## 2019-12-21 DIAGNOSIS — R278 Other lack of coordination: Secondary | ICD-10-CM

## 2019-12-21 DIAGNOSIS — R29898 Other symptoms and signs involving the musculoskeletal system: Secondary | ICD-10-CM

## 2019-12-21 DIAGNOSIS — R29818 Other symptoms and signs involving the nervous system: Secondary | ICD-10-CM | POA: Diagnosis not present

## 2019-12-21 DIAGNOSIS — R2689 Other abnormalities of gait and mobility: Secondary | ICD-10-CM

## 2019-12-21 NOTE — Therapy (Signed)
[place] [employer], [place], [address] Phone: [phone]   Fax:  [phone]  Physical Therapy Treatment/ Discharge Summary   Patient Details  Name: Jessica Harrell MRN: [id number] Date of Birth: [birth year] Referring Provider (PT): Kathryne Eriksson MD   Encounter Date: 12/21/2019   PHYSICAL THERAPY DISCHARGE SUMMARY  Visits from Start of Care: 37  Current functional level related to goals / functional outcomes: See below   Remaining deficits: See below   Education / Equipment: See assessment  Plan: Patient agrees to discharge.  Patient goals were partially met. Patient is being discharged due to lack of progress.  ?????       PT End of Session - 12/21/19 1452    Visit Number  37    Number of Visits  37    Date for PT Re-Evaluation  12/21/19    Authorization Type  [facility] Medicare Visits based on Medical Necessity    Authorization Time Period  08/30/19 - 10/30/19; 10/20/19-11/24/19; 11/23/19-12/21/19    Authorization - Visit Number  9    Authorization - Number of Visits  10    PT Start Time  1300    PT Stop Time  1350    PT Time Calculation (min)  50 min    Equipment Utilized During Treatment  Gait belt;Other (comment)   RW   Activity Tolerance  Patient tolerated treatment well    Behavior During Therapy  WFL for tasks assessed/performed       Past Medical History:  Diagnosis Date  . Breast cancer ([place])   . Right foot pain Nov. 2014    Past Surgical History:  Procedure Laterality Date  . BREAST BIOPSY    . BUBBLE STUDY  05/18/2019   Procedure: BUBBLE STUDY;  Surgeon: Elouise Munroe, MD;  Location: [facility] ENDOSCOPY;  Service: Cardiology;;  . CATARACT EXTRACTION     both eyes  . EYE SURGERY Left   . MASTECTOMY W/ SENTINEL NODE BIOPSY Left 05/09/2019   Procedure: LEFT MASTECTOMY WITH LEFT AXILLARY SENTINEL LYMPH NODE BIOPSY;  Surgeon: Coralie Keens, MD;  Location: [facility];  Service: General;  Laterality:  Left;  . TEE WITHOUT CARDIOVERSION N/A 05/18/2019   Procedure: TRANSESOPHAGEAL ECHOCARDIOGRAM (TEE);  Surgeon: Elouise Munroe, MD;  Location: [facility];  Service: Cardiology;  Laterality: N/A;    There were no vitals filed for this visit.  Subjective Assessment - 12/21/19 1449    Subjective  Patient and family reports that "some things are coming together". They says that patient has began to wake up early to get dressed for therapy, even on days that she does not have therapy. They say she is doing better overall and has not had any falls or new accidents since last visit.    Pertinent History  CVA 05/14/19    Patient Stated Goals  Walk better    Currently in Pain?  No/denies         [facility] PT Assessment - 12/21/19 0001      Assessment   Medical Diagnosis  s/p CVA    Referring Provider (PT)  Kathryne Eriksson MD    Onset Date/Surgical Date  05/14/19      Precautions   Precautions  Fall      Restrictions   Weight Bearing Restrictions  No      Balance Screen   Has the patient fallen in the past 6 months  Yes      Home Environment  Living Environment  Private residence    Living Arrangements  Spouse/significant other    Available Help at Discharge  Family      Prior Function   Level of Independence  Independent      Cognition   Overall Cognitive Status  Impaired/Different from baseline      Transfers   Sit to Stand  4: Min assist    Sit to Stand Details  Verbal cues for sequencing;Verbal cues for technique      Ambulation/Gait   Ambulation/Gait  Yes    Ambulation/Gait Assistance  4: Min assist    Ambulation Distance (Feet)  100 Feet    Assistive device  Rolling walker    Ambulation Surface  Level;Indoor                   [facility] Adult PT Treatment/Exercise - 12/21/19 0001      Knee/Hip Exercises: Aerobic   Recumbent Bike  5 min warm up level 3   husband assist for on and off of bike      Knee/Hip Exercises: Standing   Gait Training  100 feet x 1  with RW, daughter assist     Other Standing Knee Exercises  standing balance with RW; CG/ Min guard x 3 (16 second max today)      Knee/Hip Exercises: Seated   Sit to Sand  5 reps;with UE support;1 set   daughter assist             PT Education - 12/21/19 1449    Education Details  Educated patient and family members on progress to goals and DC status. Addressed family questions and concerns regarding home exercises. Educated them on continuation of functional mobility training, working with patient on transfers and gait using rolling walker at home. Reviewed mechanics and form and practiced patient transfers with husband and daughter, reviewed gait training with patients daughter.    Person(s) Educated  Patient    Methods  Explanation    Comprehension  Verbalized understanding       PT Short Term Goals - 12/21/19 1504      PT SHORT TERM GOAL #1   Title  Patient will be educated on HEP and report regular compliance to improve safety and independence with functional mobility.    Time  4    Period  Weeks    Status  Achieved    Target Date  09/27/19        PT Long Term Goals - 12/21/19 1504      PT LONG TERM GOAL #1   Title  Patient will demonstrate ability to perform sit to stand transfer with modified independence to improve overall functional mobility.    Baseline  Requires Min A    Time  5    Period  Weeks    Status  Not Met      PT LONG TERM GOAL #2   Title  Patient will demonstrate ability to ambulate 100 feet with LRAD with no more than minimal assistance and minimal tactile and verbal cues for form.    Baseline  Able to perform with RW, daughter assisting with Min A for verbal cues and weight shift assist    Time  5    Period  Weeks    Status  Achieved      PT LONG TERM GOAL #3   Title  Patient's caregivers/family will demonstrate ability to transfer and ambulate with patient with proper body mechanics and techniques for  proper exercise at home.    Time  5     Period  Weeks    Status  Achieved      PT LONG TERM GOAL #4   Title  Patient will demonstrate ability to correct standing posture and maintain standing balance for at least 1 minute with no more than minimal verbal cueing to improve independence with functional mobility.    Baseline  Able to maintain 45-50 seconds max with use on RW, no assistance    Time  5    Period  Weeks    Status  Not Met            Plan - 12/21/19 1452    Clinical Impression Statement  Reviewed patients' goals and addressed family member concerns in preparation for discharge from outpatient therapy today. Incorporated patient's husband in transfer training, and engaged and reviewed patient's daughter when assisting gait training. Family members did very well with this, patients' daughter shows excellent carry over for cueing, pacing of activity with patient and overall safety awareness. Patient's husband was encouraged to continue practicing transfers under supervision/ assistance from his daughter to become more comfortable with verbal and tactile cueing of patient when he is assisting her in their home with getting out of bed, toileting etc. Patient did well today and partially met all therapy goals. Patient was able to ambulate 100 feet with rolling walker with assistance form her daughter for tactile cueing of weight shift and verbal cues for sequencing of BLEs. Patient had trouble with maintaining standing balance today and was only able to stand 16 seconds using RW with no assistance. Patient has been able to maintain standing balance upwards of 45-50 seconds within past 2-3 visits, but ability is highly variably day to day. Overall patient has made very good progress toward LTGs, and has reached apparent plateau in function at this time. Spoke at length with patient and family members about continuation of transfer and gait training at home and spoke with them about possible referral for home health therapy should  they need due to potential safety hazards or concerns e.g. transition steps room to room, ambulating shag carpeting, navigating obstacles in home.    Personal Factors and Comorbidities  Age;Comorbidity 1    Comorbidities  Hx Breast Cancer, S/P CVA    Examination-Activity Limitations  Bathing;Hygiene/Grooming;Squat;Stairs;Bend;Lift;Locomotion Level;Stand;Transfers;Dressing    Examination-Participation Restrictions  Cleaning;Shop;Laundry;Community Activity;Meal Prep    Stability/Clinical Decision Making  Evolving/Moderate complexity    Rehab Potential  Good    PT Frequency  2x / week    PT Duration  4 weeks    PT Treatment/Interventions  ADLs/Self Care Home Management;Aquatic Therapy;Cryotherapy;Electrical Stimulation;Moist Heat;DME Instruction;Gait training;Stair training;Functional mobility training;Therapeutic activities;Therapeutic exercise;Balance training;Neuromuscular re-education;Patient/family education;Manual techniques;Passive range of motion;Dry needling;Energy conservation;Taping    PT Next Visit Plan  DC    PT Home Exercise Plan  9/28: seated stepping towards sticky note targets for coordination; 10/12: gait training 3x/day before/after sleep depending on fatigue, heel raises and mini squats at countertop for UE support with family at side; 10/19: lateral scooting while seated EOB from HOB<>foot of bed    Recommended Other Services  Possible home health therapy if appropriate per referring MD    Consulted and Agree with Plan of Care  Patient;Family member/caregiver    Family Member Consulted  spouse [name]), daughter Thersa Salt)       Patient will benefit from skilled therapeutic intervention in order to improve the following deficits and impairments:  Abnormal gait, Impaired sensation,  Improper body mechanics, Decreased coordination, Decreased mobility, Postural dysfunction, Decreased activity tolerance, Decreased endurance, Decreased strength, Decreased balance, Decreased safety  awareness, Difficulty walking, Impaired vision/preception  Visit Diagnosis: Other symptoms and signs involving the nervous system  Other lack of coordination  Other abnormalities of gait and mobility  Other symptoms and signs involving the musculoskeletal system     Problem List Patient Active Problem List   Diagnosis Date Noted  . Acute ischemic stroke ([place]) 05/15/2019  . Acute metabolic encephalopathy 68/93/4068  . Encephalopathy   . History of left breast cancer 05/09/2019  . Malignant neoplasm of lower-inner quadrant of left breast in female, estrogen receptor positive ([place]) 04/26/2018   3:13 PM, 12/21/19 Josue Hector PT DPT  Physical Therapist with [facility]  [phone] [place], [place], [address] Phone: [phone]   Fax:  [phone]  Name: Lilli Dewald MRN: [id number] Date of Birth: 08/12/1941

## 2019-12-23 NOTE — Progress Notes (Signed)
ILR remote 

## 2019-12-26 ENCOUNTER — Ambulatory Visit (INDEPENDENT_AMBULATORY_CARE_PROVIDER_SITE_OTHER): Payer: Medicare Other | Admitting: *Deleted

## 2019-12-26 DIAGNOSIS — I639 Cerebral infarction, unspecified: Secondary | ICD-10-CM

## 2019-12-26 LAB — CUP PACEART REMOTE DEVICE CHECK
Date Time Interrogation Session: 20210119133812
Implantable Pulse Generator Implant Date: 20200903

## 2020-01-05 ENCOUNTER — Other Ambulatory Visit: Payer: Self-pay | Admitting: Hematology and Oncology

## 2020-01-29 ENCOUNTER — Ambulatory Visit (INDEPENDENT_AMBULATORY_CARE_PROVIDER_SITE_OTHER): Payer: Medicare Other | Admitting: *Deleted

## 2020-01-29 DIAGNOSIS — I639 Cerebral infarction, unspecified: Secondary | ICD-10-CM | POA: Diagnosis not present

## 2020-01-29 LAB — CUP PACEART REMOTE DEVICE CHECK
Date Time Interrogation Session: 20210221230152
Implantable Pulse Generator Implant Date: 20200903

## 2020-01-30 NOTE — Progress Notes (Signed)
ILR Remote 

## 2020-02-08 ENCOUNTER — Ambulatory Visit: Payer: Medicare Other | Admitting: Adult Health

## 2020-02-08 ENCOUNTER — Other Ambulatory Visit: Payer: Self-pay

## 2020-02-08 ENCOUNTER — Encounter: Payer: Self-pay | Admitting: Adult Health

## 2020-02-08 ENCOUNTER — Telehealth: Payer: Self-pay

## 2020-02-08 VITALS — BP 108/65 | HR 73

## 2020-02-08 DIAGNOSIS — I69393 Ataxia following cerebral infarction: Secondary | ICD-10-CM

## 2020-02-08 DIAGNOSIS — I639 Cerebral infarction, unspecified: Secondary | ICD-10-CM

## 2020-02-08 DIAGNOSIS — R471 Dysarthria and anarthria: Secondary | ICD-10-CM

## 2020-02-08 DIAGNOSIS — H538 Other visual disturbances: Secondary | ICD-10-CM

## 2020-02-08 DIAGNOSIS — E785 Hyperlipidemia, unspecified: Secondary | ICD-10-CM

## 2020-02-08 DIAGNOSIS — I10 Essential (primary) hypertension: Secondary | ICD-10-CM

## 2020-02-08 NOTE — Telephone Encounter (Signed)
Pt completed MR request form for Dr. Zenia Resides office to fax [place] records. Given to Hilda Blades in MR to request those records.

## 2020-02-08 NOTE — Progress Notes (Signed)
I agree with the above plan 

## 2020-02-08 NOTE — Patient Instructions (Addendum)
Continue taking Asprin and crestor for secondary stroke prevention.  Continue to work with with speech therapy and PT.   Continue to follow up with PCP for hypertension and hyperlipidemia management.  Loop recorder will continue to be monitored for possible atrial fibrillation.   Follow up in 6 months or call sooner if needed.

## 2020-02-08 NOTE — Progress Notes (Signed)
[facility] [address] [place]. [place] [address] [phone]       OFFICE FOLLOW UP NOTE  [name] Date of Birth:  01-23-1941 Medical Record Number:  [id number]   Reason for Referral: Stroke follow-up    CHIEF COMPLAINT:  Chief Complaint  Patient presents with  . Follow-up    New room, with daughter, [place]. Reports doing well.    HPI:  [name] is a 79 year old female who is being seen today, 02/08/2020, for stroke follow-up accompanied by her daughter. Residual deficits of left-sided dysmetria, dysarthria, cognitive impairment and visual impairment. Patient did see opthalmologist Dr Katy Fitch in December 2020 and will follow up with him in 6 months to see if vision has improved. She recently started to ambulate short distances with a walker and assistance but normally uses the wheelchair for transportation.  Continues on aspirin 81 mg and Crestor for secondary stroke prevention without side effects. Blood pressure today 108/65.  Loop recorder is not shown atrial fibrillation thus far.  No further concerns at this time.   History provided for reference purposes only Stroke admission 05/10/2019: [name] is a 79 y.o. female with history of breast cancer s/p mastectomy on 05/09/2019 and smoker who was discharged on 05/10/2019, did well for 2 days then became weak and lethargic on 05/13/2019, developing confusion and decreased p.o. intake.  Per review of note, husband initially believed symptoms are secondary to recent surgery and residual effects of anesthesia.  Symptoms worsened and EMS called on 05/15/2019.  She presented to [facility] ED with CT showing bilateral cerebellar and left occipital lobe hypoattenuation.  MRI showed multiple bilateral posterior circulation cerebellar infarcts in the occipital lobes bilaterally, left thalamus and left tectum.  MRI showed diffuse intracranial arthrosclerosis.  Transferred to [facility] for further  evaluation and management.  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  2D echo showed an EF of 60 to 65% without cardiac source of embolus identified and negative bubble.  TEE positive for PFO and bidirectional shunt at rest but no evidence of vegetation or clots.  Lower extremity venous Doppler negative for DVT.  Recommended loop recorder for atrial fibrillation monitoring once complete healing of recent mastectomy wound and plans on following with cardiology outpatient.  LDL 139 and A1c 5.6.  Initiated aspirin and rosuvastatin for secondary stroke prevention.  Other stroke risk factors include advanced age, tobacco use and EtOH use.  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  Virtual visit 06/26/2019: Residual deficits blurred vision (increased difficulty close up), balance deficits, mild cognitive impairment and dysphagia.  All residual deficits have been improving and she continues to participate in therapies. Did have FEES and recommend NTL but speech therapy felt it was not safe due to continued choking therefore went back to HTL. Ambulation has improved with mild leaning towards right with use of RW with therapy but uses WC while not with therapies for transportation.  Mild processing delay but overall improved. She plans on leaving facility today to return home with husband and daughter with recommendations of home health therapies Continues on aspirin 81 mg without bleeding or bruising Continues on Crestor 10 mg without myalgias Blood pressure 110/58 Cardiology appointment 07/11/2019 and likely will discuss possible loop recorder placement No concerns  at this time.  Denies new or worsening stroke/TIA symptoms.  Update 10/05/2019: [name] is a 79 year old female who is  being seen today for stroke follow-up accompanied by her daughter.  Residual deficits of left-sided dysmetria, dysarthria and blurred vision.  She has since returned home from nursing home and currently lives with her husband with assistance from her daughter during the day.  Recently released from speech and occupational therapy but continues to receive physical therapy with ongoing improvement.  She has not had follow-up with ophthalmology at this time regarding visual loss.  Continues on aspirin 81 mg and Crestor for secondary stroke prevention without side effects.  Blood pressure today 111/64.  Loop recorder placed by cardiology on 08/10/2019 which has not shown atrial fibrillation at this time.  No further concerns at this time.    ROS:   14 system review of systems performed and negative with exception of blurred vision, walking difficulty and speech difficulty  PMH:  Past Medical History:  Diagnosis Date  . Breast cancer ([place])   . Right foot pain Nov. 2014    PSH:  Past Surgical History:  Procedure Laterality Date  . BREAST BIOPSY    . BUBBLE STUDY  05/18/2019   Procedure: BUBBLE STUDY;  Surgeon: Elouise Munroe, MD;  Location: [facility] ENDOSCOPY;  Service: Cardiology;;  . CATARACT EXTRACTION     both eyes  . EYE SURGERY Left   . MASTECTOMY W/ SENTINEL NODE BIOPSY Left 05/09/2019   Procedure: LEFT MASTECTOMY WITH LEFT AXILLARY SENTINEL LYMPH NODE BIOPSY;  Surgeon: Coralie Keens, MD;  Location: [place];  Service: General;  Laterality: Left;  . TEE WITHOUT CARDIOVERSION N/A 05/18/2019   Procedure: TRANSESOPHAGEAL ECHOCARDIOGRAM (TEE);  Surgeon: Elouise Munroe, MD;  Location: [place];  Service: Cardiology;  Laterality: N/A;    Social History:  Social History   Socioeconomic History  . Marital status: Married    Spouse name: Not on file  . Number of children: Not on file  . Years of education: Not on file  . Highest education level: Not on file  Occupational History     Comment: retired  Tobacco Use  . Smoking status: Current Every Day Smoker    Packs/day: 0.50    Years: 58.00    Pack years: 29.00    Types: Cigarettes  . Smokeless tobacco: Never Used  Substance and Sexual Activity  . Alcohol use: Yes    Alcohol/week: 7.0 standard drinks    Types: 7 Cans of beer per week    Comment: 1 beer every 1-2 days  . Drug use: No  . Sexual activity: Not Currently  Other Topics Concern  . Not on file  Social History Narrative   Resides in [place]. Has one grown daughter. No grandchildren. No pets.    Social Determinants of Health   Financial Resource Strain:   . Difficulty of Paying Living Expenses: Not on file  Food Insecurity:   . Worried About Charity fundraiser in the Last Year: Not on file  . Ran Out of Food in the Last Year: Not on file  Transportation Needs:   . Lack of Transportation (Medical): Not on file  . Lack of Transportation (Non-Medical): Not on file  Physical Activity:   . Days of Exercise per Week: Not on file  . Minutes of Exercise per Session: Not on file  Stress:   . Feeling of Stress : Not on file  Social Connections:   . Frequency of Communication  with Friends and Family: Not on file  . Frequency of Social Gatherings with Friends and Family: Not on file  . Attends Religious Services: Not on file  . Active Member of Clubs or Organizations: Not on file  . Attends Archivist Meetings: Not on file  . Marital Status: Not on file  Intimate Partner Violence:   . Fear of Current or Ex-Partner: Not on file  . Emotionally Abused: Not on file  . Physically Abused: Not on file  . Sexually Abused: Not on file    Family History:  Family History  Problem Relation Age of Onset  . Cancer Mother        esophageal  . Hypertension Mother   . Varicose Veins Mother   . Cancer Sister        non hodgkins  . Cancer Other        unknown    Medications:   Current Outpatient Medications on File Prior to Visit  Medication Sig  Dispense Refill  . amLODipine (NORVASC) 5 MG tablet Take 5 mg by mouth daily.    Marland Kitchen aspirin EC 81 MG tablet Take 2 tablets by mouth daily.    Marland Kitchen letrozole (FEMARA) 2.5 MG tablet TAKE 1 TABLET ONCE DAILY. 90 tablet 1  . mirtazapine (REMERON) 15 MG tablet Take 1 tablet (15 mg total) by mouth at bedtime.    . rosuvastatin (CRESTOR) 10 MG tablet Take 10 mg by mouth daily.     No current facility-administered medications on file prior to visit.    Allergies:   Allergies  Allergen Reactions  . Atorvastatin Other (See Comments)    Muscle pain      Physical Exam  Vitals:   02/08/20 1036  BP: 108/65  Pulse: 73     General: Frail pleasant elderly Caucasian female, seated in WC, in no evident distress Head: head normocephalic and atraumatic.   Neck: supple with no carotid or supraclavicular bruits Cardiovascular: regular rate and rhythm, no murmurs Musculoskeletal: no deformity Skin:  no rash/petichiae Vascular:  Normal pulses all extremities   Neurologic Exam Mental Status: Awake and fully alert. Moderate dysarthria -stable. Oriented to place and time. Recent and remote memory impaired. Attention span, concentration and fund of knowledge impaired with majority of history provided by daughter. Mood and affect appropriate.  Cranial Nerves: Pupils equal, briskly reactive to light. Extraocular movements full without nystagmus. 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  Hearing intact. Facial sensation intact.  Left lower facial weakness.  Motor: Normal bulk and tone. Normal strength in all tested extremity muscles. Sensory.: intact to touch , pinprick , position and vibratory sensation.  Coordination: Rapid alternating movements difficulty performing BUE. Finger-to-nose and heel-to-shin show dysmetria/ataxia in left upper and lower extremities.  RUE fine action tremors.  Gait and Station: Gait  assessment deferred Reflexes: 1+ and symmetric. Toes downgoing.     Diagnostic Data (Labs, Imaging, Testing)   CT head bilateral cerebellar and left occipital infarcts  MRI multiple bilateral posterior circulation cerebellar infarcts in the occipital lobes bilaterally, left thalamus and left tectum  MRA diffuse intracranial atherosclerosis  CTA head and neck 50% stenosis of left vertebral artery origin otherwise no other large vessel stenosis or occlusion   carotid Doppler the CTA results  2D Echo EF 60-65%. No source of embolus. Negative bubble  TEE PFO with bidirectional shunting at rest but negative for endocarditis or cardiac source of embolus  LE Doppler negative for DVT  LDL 139 mg percent  HgbA1c 5.6    ASSESSMENT: Jessica Harrell is a 79 y.o. year old female who presented to [facility] ED with somnolence and confusion on 05/15/2019 and soon transferred to [facility]  Stroke work-up revealed bilateral cerebellar, bilateral occipital, left thalamic and left abdomen infarcts embolic pattern secondary to unknown source with differentials including hypercoagulability with a cancer diagnosis or atrial fibrillation as endocarditis ruled out.  Recently underwent mastectomy on 05/09/2019.  Loop recorder was placed on .  Vascular risk factors include HTN, HLD, intracranial arthrosclerosis, advanced age, tobacco use and EtOH use. Loop recorder placed by cardiology on 08/10/2019 which has not shown atrial fibrillation at this time. 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 Cryptogenic stroke: Continue aspirin 81 mg daily  and rosuvastatin 10 mg daily for secondary stroke prevention. Maintain strict control of hypertension with blood pressure goal below 130/90, diabetes with hemoglobin A1c goal below 6.5% and cholesterol with LDL cholesterol (bad cholesterol) goal below 70 mg/dL.   I also advised the patient to eat a healthy diet with plenty of whole grains, cereals, fruits and vegetables, exercise regularly with at least 30 minutes of continuous activity daily and maintain ideal body weight.  Continue to monitor loop recorder for atrial fibrillation. 2. Residual deficits as above: Continue to work with PT and SLP for ongoing improvement of overall ambulation.  Continue to follow with ophthalmology as recommended.  We will request office visit notes be faxed to our office from Dr. Katy Fitch.  3. HTN: Advised to continue current treatment regimen.  Today's BP satisfactory.  Advised to continue to monitor at home along with continued follow-up with PCP for management 4. HLD: Advised to continue current treatment regimen along with continued follow-up with PCP for future prescribing and monitoring of lipid panel   Follow up in 6 months or call earlier if needed   I spent 25 minutes of face-to-face and non-face-to-face time with patient.  This included previsit chart review, lab review, study review, order entry, electronic health record documentation, patient education    Frann Rider, [facility]  [facility] [address] [place], [place] [address]  Phone [phone] Fax [phone] Note: This document was prepared with digital dictation and possible smart phrase technology. Any transcriptional errors that result from this process are unintentional.

## 2020-02-29 ENCOUNTER — Ambulatory Visit (INDEPENDENT_AMBULATORY_CARE_PROVIDER_SITE_OTHER): Payer: Medicare Other | Admitting: *Deleted

## 2020-02-29 DIAGNOSIS — I639 Cerebral infarction, unspecified: Secondary | ICD-10-CM | POA: Diagnosis not present

## 2020-02-29 LAB — CUP PACEART REMOTE DEVICE CHECK
Date Time Interrogation Session: 20210324230527
Implantable Pulse Generator Implant Date: 20200903

## 2020-03-01 NOTE — Progress Notes (Signed)
ILR Remote 

## 2020-03-07 ENCOUNTER — Other Ambulatory Visit: Payer: Self-pay

## 2020-03-07 ENCOUNTER — Other Ambulatory Visit: Payer: Self-pay | Admitting: Adult Health

## 2020-03-07 ENCOUNTER — Ambulatory Visit (HOSPITAL_COMMUNITY)
Admission: RE | Admit: 2020-03-07 | Discharge: 2020-03-07 | Disposition: A | Discharge status: Home / Self Care | Payer: Medicare Other | Source: Ambulatory Visit | Attending: Adult Health | Admitting: Adult Health

## 2020-03-07 DIAGNOSIS — R911 Solitary pulmonary nodule: Secondary | ICD-10-CM | POA: Insufficient documentation

## 2020-03-07 DIAGNOSIS — C50312 Malignant neoplasm of lower-inner quadrant of left female breast: Secondary | ICD-10-CM

## 2020-03-07 NOTE — Progress Notes (Signed)
PET scan order placed today after reviewing CT scan showing the persistent pulmonary nodules and recommendation for further evaluation by radiology.  I reviewed this with patient daughter Jessica Harrell.  Jessica Harrell will undergo PET scan and will see Dr. Lindi Adie the next day--virtually.Wilber Bihari, NP

## 2020-03-25 IMAGING — MR MRA HEAD WITHOUT CONTRAST
13 of 14 series · 39 of 48 positions shown · non-contrast
Comparison: CT head 05/15/2019

CLINICAL DATA: Stroke.  Recent mastectomy 05/09/2019

EXAM:
MRI HEAD WITHOUT CONTRAST
MRA HEAD WITHOUT CONTRAST
TECHNIQUE: Multiplanar, multiecho pulse sequences of the brain and surrounding
structures were obtained without intravenous contrast. Angiographic
images of the head were obtained using MRA technique without
contrast.

[Series 5: DWI · axial · 3.0mm · 0.88mm/px · z∈[-20,+104]mm · 6 of 96 slices shown (1 of 4)]
[im 1/96]
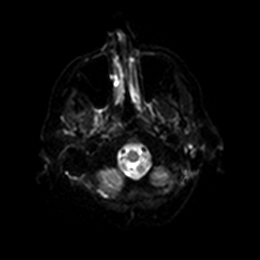
[im 20/96]
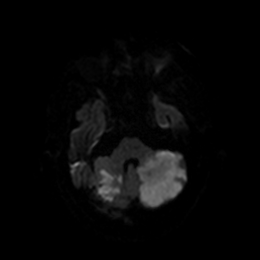
[im 39/96]
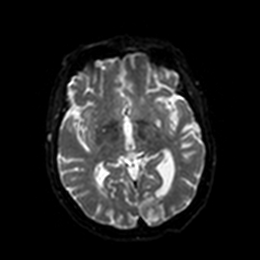
[im 58/96]
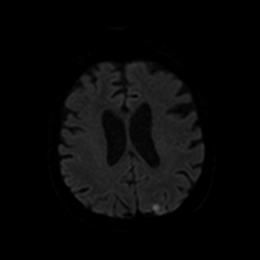
[im 77/96]
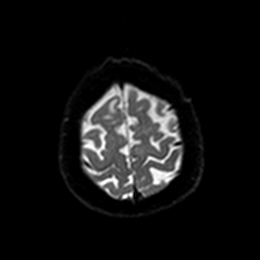
[im 96/96]
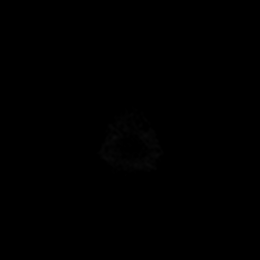

[Series 6: DWI · axial · 3.0mm · 0.88mm/px · z∈[-20,+104]mm · 2 of 48 slices shown (2 of 4)]
[im 1/48]
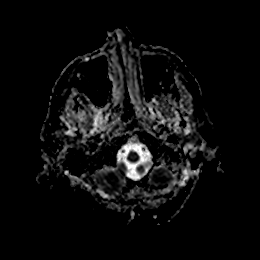
[im 48/48]
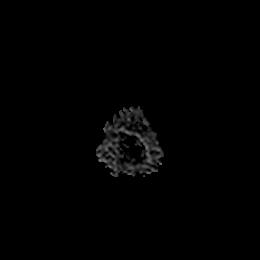

[Series 7: DWI · coronal · 4.0mm · 0.88mm/px · 5 of 64 slices shown (3 of 4)]
[im 1/64]
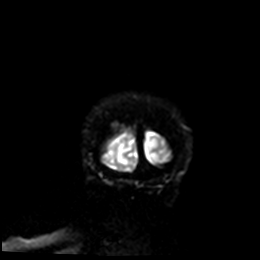
[im 16/64]
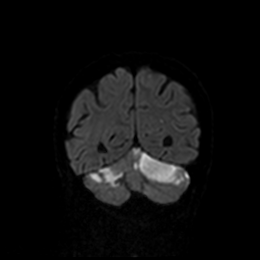
[im 32/64]
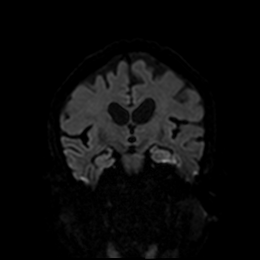
[im 48/64]
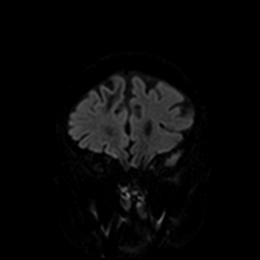
[im 64/64]
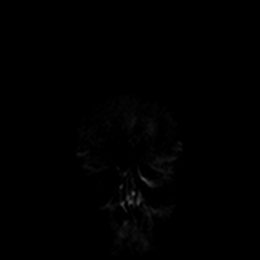

[Series 8: DWI · coronal · 4.0mm · 0.88mm/px · 2 of 32 slices shown (4 of 4)]
[im 1/32]
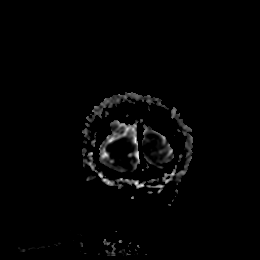
[im 32/32]
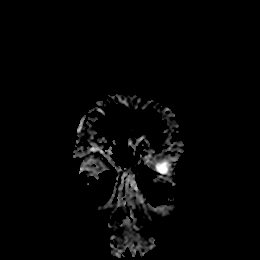

[Series 13: T1 · sagittal · 5.0mm · 0.75mm/px · 2 of 23 slices shown]
[im 1/23]
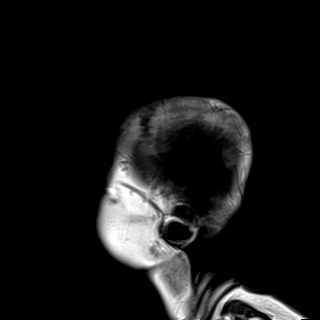
[im 23/23]
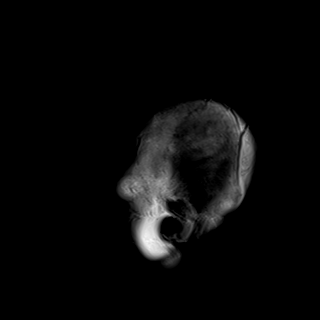

[Series 14: T2 · axial · 5.0mm · 0.72mm/px · z∈[-26,+100]mm · 2 of 25 slices shown (1 of 2)]
[im 1/25]
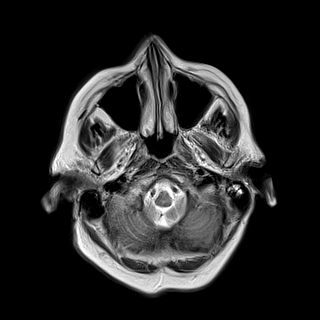
[im 25/25]
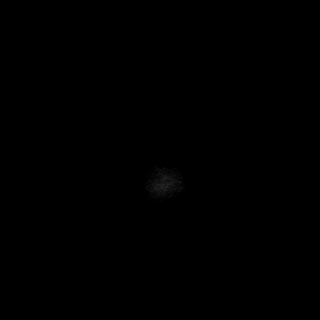

[Series 15: FLAIR · axial · 5.0mm · 0.90mm/px · z∈[-44,+86]mm · 2 of 25 slices shown (1 of 2)]
[im 1/25]
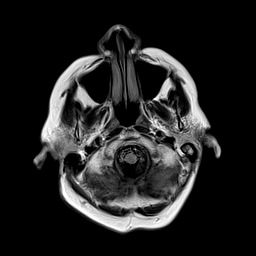
[im 25/25]
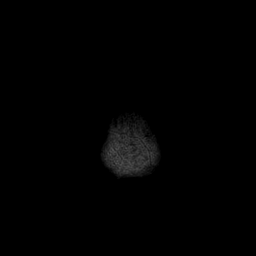

[Series 16: mag_images · axial · 3.0mm · 0.90mm/px · z∈[-27,+107]mm · 4 of 52 slices shown]
[im 1/52]
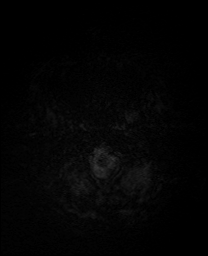
[im 18/52]
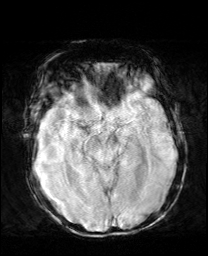
[im 35/52]
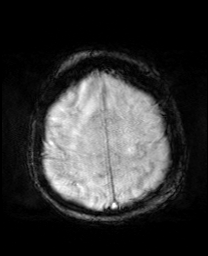
[im 52/52]
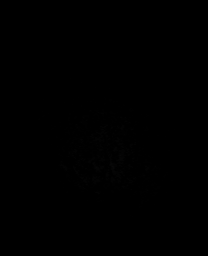

[Series 17: pha_images · axial · 3.0mm · 0.90mm/px · z∈[-27,+99]mm · 4 of 49 slices shown]
[im 1/49]
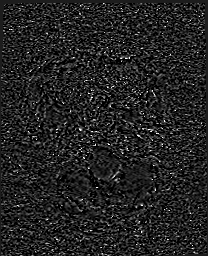
[im 17/49]
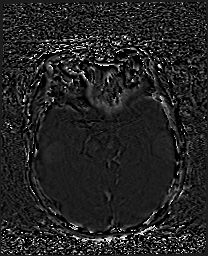
[im 33/49]
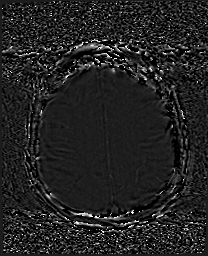
[im 49/49]
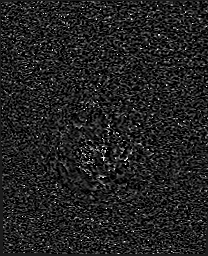

[Series 18: swi_images · axial · 3.0mm · 0.90mm/px · z∈[-27,+107]mm · 4 of 52 slices shown]
[im 1/52]
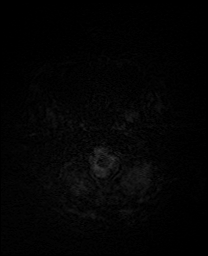
[im 18/52]
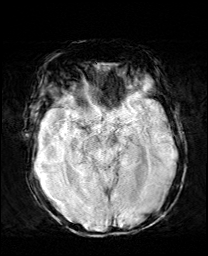
[im 35/52]
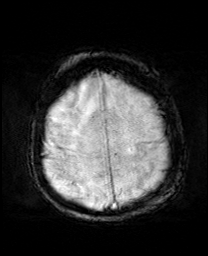
[im 52/52]
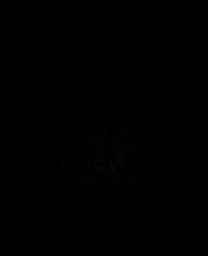

[Series 20: FLAIR · axial · 5.0mm · 0.90mm/px · z∈[-42,+89]mm · 2 of 25 slices shown (2 of 2)]
[im 1/25]
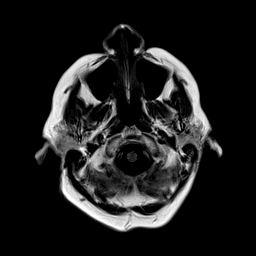
[im 25/25]
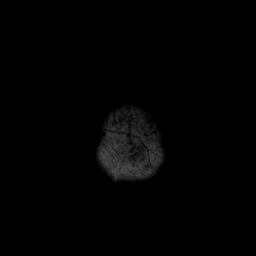

[Series 21: T2 · coronal · 5.0mm · 0.72mm/px · 2 of 26 slices shown (2 of 2)]
[im 1/26]
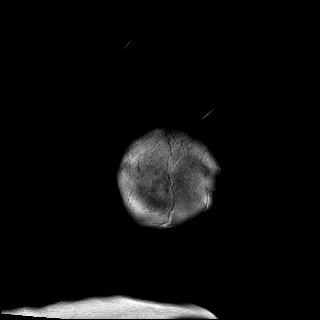
[im 26/26]
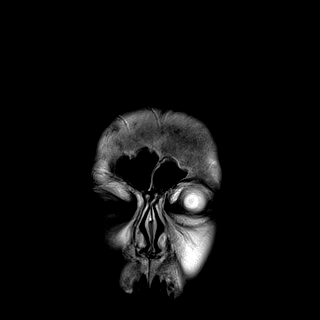

[Series 22: ax hemo · axial · 5.0mm · 0.86mm/px · z∈[-39,+93]mm · 2 of 25 slices shown]
[im 1/25]
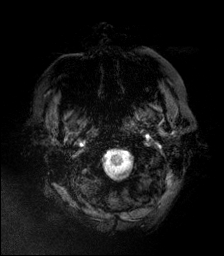
[im 25/25]
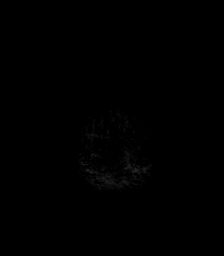

[39 of 48 positions shown; findings below may reference images not displayed]

FINDINGS: MRI HEAD FINDINGS

Brain: Multiple areas of acute infarction in the posterior
circulation. Relatively large superior cerebellar infarct on the
left. Moderate right superior cerebellar infarct. Acute infarcts in
the occipital lobe bilaterally left greater than right. Small acute
infarcts in the left thalamus. Small area of acute infarct in the
left tectum.

Generalized right atrophy frontal lobe. Chronic microvascular
ischemic change in the white matter bilaterally. Negative for
hemorrhage or mass. No hydrocephalus identified.

Vascular: Normal arterial flow voids. In particular, normal flow
voids in both vertebral arteries and the basilar artery and both
carotid arteries.

Skull and upper cervical spine: Negative

Sinuses/Orbits: Paranasal sinuses clear. Bilateral cataract surgery.

Other: None

MRA HEAD FINDINGS

Both vertebral arteries widely patent. Basilar widely patent. PICA
not visualized. Small left AICA patent. Superior cerebellar arteries
not visualized. Both posterior cerebral arteries are patent.
Moderate stenosis left P1 segment. Mild stenosis distal posterior
cerebral artery bilaterally.

Cavernous carotid artery patent bilaterally with atherosclerotic
irregularity. Moderate stenosis of the cavernous carotid on the left
and mild stenosis supraclinoid internal carotid artery on the right.

Both anterior cerebral arteries patent.

M1 segments patent bilaterally. Moderate to severe disease in the M2
branches bilaterally. Negative for aneurysm.
IMPRESSION: 1. Multiple large areas of acute infarction in the posterior
circulation involving the cerebellum bilaterally, occipital lobes
bilaterally, left thalamus, and left tectum. No hemorrhage. Findings
may be due to posterior circulation emboli. Suggest CTA head and
neck for further evaluation of the arterial circulation.
2. Diffuse intracranial atherosclerotic disease.

## 2020-04-01 LAB — CUP PACEART REMOTE DEVICE CHECK
Date Time Interrogation Session: 20210424230548
Implantable Pulse Generator Implant Date: 20200903

## 2020-04-02 ENCOUNTER — Ambulatory Visit (INDEPENDENT_AMBULATORY_CARE_PROVIDER_SITE_OTHER): Payer: Medicare Other | Admitting: *Deleted

## 2020-04-02 DIAGNOSIS — I639 Cerebral infarction, unspecified: Secondary | ICD-10-CM

## 2020-04-03 NOTE — Progress Notes (Signed)
ILR Remote 

## 2020-05-01 ENCOUNTER — Ambulatory Visit (INDEPENDENT_AMBULATORY_CARE_PROVIDER_SITE_OTHER): Payer: Medicare Other | Admitting: *Deleted

## 2020-05-01 DIAGNOSIS — I639 Cerebral infarction, unspecified: Secondary | ICD-10-CM

## 2020-05-01 LAB — CUP PACEART REMOTE DEVICE CHECK
Date Time Interrogation Session: 20210525230626
Implantable Pulse Generator Implant Date: 20200903

## 2020-05-02 NOTE — Progress Notes (Signed)
Carelink Summary Report / Loop Recorder 

## 2020-06-03 ENCOUNTER — Ambulatory Visit (INDEPENDENT_AMBULATORY_CARE_PROVIDER_SITE_OTHER): Payer: Medicare Other | Admitting: *Deleted

## 2020-06-03 DIAGNOSIS — I639 Cerebral infarction, unspecified: Secondary | ICD-10-CM | POA: Diagnosis not present

## 2020-06-03 LAB — CUP PACEART REMOTE DEVICE CHECK
Date Time Interrogation Session: 20210627230526
Implantable Pulse Generator Implant Date: 20200903

## 2020-06-05 NOTE — Progress Notes (Signed)
Carelink Summary Report / Loop Recorder 

## 2020-07-08 ENCOUNTER — Ambulatory Visit (INDEPENDENT_AMBULATORY_CARE_PROVIDER_SITE_OTHER): Payer: Medicare Other | Admitting: *Deleted

## 2020-07-08 DIAGNOSIS — I639 Cerebral infarction, unspecified: Secondary | ICD-10-CM | POA: Diagnosis not present

## 2020-07-10 LAB — CUP PACEART REMOTE DEVICE CHECK
Date Time Interrogation Session: 20210730230325
Implantable Pulse Generator Implant Date: 20200903

## 2020-07-11 NOTE — Progress Notes (Signed)
Carelink Summary Report / Loop Recorder 

## 2020-08-07 NOTE — Progress Notes (Signed)
Patient Care Team: [name], MD as PCP - General (Family Medicine) Nicholas Lose, MD as Consulting Physician (Hematology and Oncology) Coralie Keens, MD as Consulting Physician (General Surgery) Eppie Gibson, MD as Attending Physician (Radiation Oncology)  DIAGNOSIS:    ICD-10-CM   1. Malignant neoplasm of lower-inner quadrant of left breast in female, estrogen receptor positive ([place])  C50.312    Z17.0     SUMMARY OF ONCOLOGIC HISTORY: Oncology History  Malignant neoplasm of lower-inner quadrant of left breast in female, estrogen receptor positive ([place])  04/12/2018 Initial Diagnosis   Palpable mass lower inner quadrant left breast approximately measures 6 cm, biopsy ([id number]) revealed grade 1 IDC with extracellular mucin, ER 100%, PR 5%, HER-2 negative ratio 1.23, Ki-67 15% 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; stage II a clinical stage AJCC 8   04/27/2018 Cancer Staging   Staging form: Breast, AJCC 8th Edition - Clinical: Stage IIA (cT3, cN0, cM0, G2, ER+, PR+, HER2-) - Signed by Eppie Gibson, MD on 04/27/2018   04/27/2018 - 04/2025 Anti-estrogen oral therapy   Neoadjuvant antiestrogen therapy with letrozole 2.5 mg daily   05/09/2019 Surgery   Left mastectomy Ninfa Linden) [phone]): residual IDC, grade 2, 1.4cm, HER-2 - (1+), ER+ 100%, PR -, Ki67 5%, clear margins, with a 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   08/09/2019 Cancer Staging   Staging form: Breast, AJCC 8th Edition - Pathologic stage from 08/09/2019: Stage IA (pT1c, pN0, cM0, G2, ER+, PR-, HER2-) - Signed by Nicholas Lose, MD on 08/09/2019     CHIEF COMPLIANT: Follow-up of left breast cancer on letrozole  INTERVAL HISTORY: Jessica Harrell is a 79 y.o. with above-mentioned history of left breast cancer treated withneoadjuvant antiestrogen therapy, left mastectomy, and who is currently on antiestrogen therapy with letrozole. Chest CT on  03/07/20 showed no change in the right lower lobe lesion concerning for adenocarcinoma, and scattered bilateral pulmonary nodules. She presents to the clinic today for annual follow-up.   ALLERGIES:  is allergic to atorvastatin.  MEDICATIONS:  Current Outpatient Medications  Medication Sig Dispense Refill  . amLODipine (NORVASC) 5 MG tablet Take 5 mg by mouth daily.    Marland Kitchen aspirin EC 81 MG tablet Take 2 tablets by mouth daily.    Marland Kitchen letrozole (FEMARA) 2.5 MG tablet TAKE 1 TABLET ONCE DAILY. 90 tablet 1  . mirtazapine (REMERON) 15 MG tablet Take 1 tablet (15 mg total) by mouth at bedtime.    . rosuvastatin (CRESTOR) 10 MG tablet Take 10 mg by mouth daily.     No current facility-administered medications for this visit.    PHYSICAL EXAMINATION: ECOG PERFORMANCE STATUS: 1 - Symptomatic but completely ambulatory  There were no vitals filed for this visit. There were no vitals filed for this visit.  BREAST: No palpable masses or nodules in either right or left breasts. No palpable axillary supraclavicular or infraclavicular adenopathy no breast tenderness or nipple discharge. (exam performed in the presence of a chaperone)  LABORATORY DATA:  I have reviewed the data as listed CMP Latest Ref Rng & Units 05/15/2019 05/05/2019 09/22/2010  Glucose 70 - 99 mg/dL 159(H) 105(H) 88  BUN 8 - 23 mg/dL _0 Creatinine 0.44 - 1.00 mg/dL 0.69 0.92 0.96  Sodium 135 - 145 mmol/L 133(L) 134(L) 133(L)  Potassium 3.5 - 5.1 mmol/L 3.9 4.1 4.1  Chloride 98 - 111 mmol/L 95(L) 100 99  CO2 22 - 32 mmol/L 20(L) 25 28  Calcium 8.9 - 10.3 mg/dL 9.2 9.4 9.3  Total Protein 6.5 - 8.1 g/dL 7.2 - -  Total Bilirubin 0.3 - 1.2 mg/dL 1.2 - -  Alkaline Phos 38 - 126 U/L 58 - -  AST 15 - 41 U/L 20 - -  ALT 0 - 44 U/L 16 - -    Lab Results  Component Value Date   WBC 12.8 (H) 05/15/2019   HGB 14.7 05/15/2019   HCT 43.6 05/15/2019   MCV 94.0 05/15/2019   PLT 191 05/15/2019   NEUTROABS 11.5 (H) 05/15/2019     ASSESSMENT & PLAN:  Malignant neoplasm of lower-inner quadrant of left breast in female, estrogen receptor positive ([facility]) 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Neoadjuvant antiestrogen therapy with letrozole 2.5 mg dailystarted 04/26/2018- 05/09/2019 2.followed by left mastectomy: 05/09/2019: Foci of residual IDC grade 1 with extracellular mucin largest focus 1.4 cm margins negative, separate focus of DCIS intermediate grade involving a complex sclerosing lesion, ER 100%, PR 0%, Ki-67 5%, HER-2 1+ negative, 0/1 sentinel lymph node negative 3.Followed by antiestrogen therapy x7 years with letrozole ----------------------------------------------------------------------------- Hospitalization June 2020: Multiple strokes, rehabilitation, patient is blind and requires wheelchair. Her husband and daughter are worn out taking care of her.  They are hoping for some help at home.  Current treatment: Letrozole 2.5 mg daily. Started 04/27/2018 Letrozole toxicities:.  Breast cancer surveillance:  Patient will need a mammogram this year. Breast exam: Benign   Return to clinic in 1 year for follow-up    No orders of the defined types were placed in this encounter.  The patient has a good understanding of the overall plan. she agrees with it. she will call with any problems that may develop before the next visit here.  Total time spent: 20 mins including face to face time and time spent for planning, charting and coordination of care  Nicholas Lose, MD 08/08/2020  I, Cloyde Reams Dorshimer, am acting as scribe for Dr. Nicholas Lose.  I have reviewed the above documentation for accuracy and completeness, and I agree with the  above.

## 2020-08-08 ENCOUNTER — Inpatient Hospital Stay: Payer: Medicare Other | Attending: Hematology and Oncology | Admitting: Hematology and Oncology

## 2020-08-08 ENCOUNTER — Other Ambulatory Visit: Payer: Self-pay

## 2020-08-08 DIAGNOSIS — Z7982 Long term (current) use of aspirin: Secondary | ICD-10-CM | POA: Diagnosis not present

## 2020-08-08 DIAGNOSIS — Z8673 Personal history of transient ischemic attack (TIA), and cerebral infarction without residual deficits: Secondary | ICD-10-CM | POA: Diagnosis not present

## 2020-08-08 DIAGNOSIS — Z79899 Other long term (current) drug therapy: Secondary | ICD-10-CM | POA: Insufficient documentation

## 2020-08-08 DIAGNOSIS — Z9012 Acquired absence of left breast and nipple: Secondary | ICD-10-CM | POA: Diagnosis not present

## 2020-08-08 DIAGNOSIS — Z170 Estrogen receptor positive status [ER+]: Secondary | ICD-10-CM | POA: Insufficient documentation

## 2020-08-08 DIAGNOSIS — Z79811 Long term (current) use of aromatase inhibitors: Secondary | ICD-10-CM | POA: Diagnosis not present

## 2020-08-08 DIAGNOSIS — C50312 Malignant neoplasm of lower-inner quadrant of left female breast: Secondary | ICD-10-CM | POA: Diagnosis not present

## 2020-08-08 MED ORDER — ASPIRIN EC 81 MG PO TBEC: 81.0000 mg | DELAYED_RELEASE_TABLET | Freq: Every day | ORAL | Status: DC

## 2020-08-08 NOTE — Assessment & Plan Note (Signed)
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Neoadjuvant antiestrogen therapy with letrozole 2.5 mg dailystarted 04/26/2018- 05/09/2019 2.followed by left mastectomy: 05/09/2019: Foci of residual IDC grade 1 with extracellular mucin largest focus 1.4 cm margins negative, separate focus of DCIS intermediate grade involving a complex sclerosing lesion, ER 100%, PR 0%, Ki-67 5%, HER-2 1+ negative, 0/1 sentinel lymph node negative 3.Followed by antiestrogen therapy x7 years with letrozole ----------------------------------------------------------------------------- Hospitalization June 2020: Multiple strokes, rehabilitation, patient is blind and requires wheelchair. Her husband and daughter are worn out taking care of her.  They are hoping for some help at home.  Current treatment: Letrozole 2.5 mg daily. Letrozole toxicities:.  Breast cancer surveillance:  Patient will need a mammogram this year. Breast exam: Benign   Return to clinic in 1 year for follow-up

## 2020-08-09 ENCOUNTER — Telehealth: Payer: Self-pay | Admitting: Hematology and Oncology

## 2020-08-09 NOTE — Telephone Encounter (Signed)
Scheduled per 9/2 los. Called and spoke with pt daughter and confirmed appt

## 2020-08-11 LAB — CUP PACEART REMOTE DEVICE CHECK
Date Time Interrogation Session: 20210901230624
Implantable Pulse Generator Implant Date: 20200903

## 2020-08-13 ENCOUNTER — Ambulatory Visit (INDEPENDENT_AMBULATORY_CARE_PROVIDER_SITE_OTHER): Payer: Medicare Other | Admitting: *Deleted

## 2020-08-13 DIAGNOSIS — I639 Cerebral infarction, unspecified: Secondary | ICD-10-CM | POA: Diagnosis not present

## 2020-08-14 ENCOUNTER — Encounter: Payer: Self-pay | Admitting: Adult Health

## 2020-08-14 ENCOUNTER — Other Ambulatory Visit: Payer: Self-pay

## 2020-08-14 ENCOUNTER — Ambulatory Visit: Payer: Medicare Other | Admitting: Adult Health

## 2020-08-14 VITALS — BP 112/72 | HR 52

## 2020-08-14 DIAGNOSIS — I639 Cerebral infarction, unspecified: Secondary | ICD-10-CM

## 2020-08-14 DIAGNOSIS — I10 Essential (primary) hypertension: Secondary | ICD-10-CM | POA: Diagnosis not present

## 2020-08-14 DIAGNOSIS — E785 Hyperlipidemia, unspecified: Secondary | ICD-10-CM

## 2020-08-14 NOTE — Progress Notes (Signed)
I agree with the above plan 

## 2020-08-14 NOTE — Progress Notes (Signed)
[facility] [address] [place]. [place] [address] [phone]       OFFICE FOLLOW UP NOTE  [name] Date of Birth:  03/03/1941 Medical Record Number:  [id number]   Reason for Referral: Stroke follow-up    CHIEF COMPLAINT:  Chief Complaint  Patient presents with  . Follow-up    6 month f/u, per patient, she has doing well. Per daughter, she is doing well.   . treatment room    with daughter    HPI:  Today, 08/14/2020, [name] returns for stroke follow-up accompanied by her daughter  Reports residual deficits of left-sided ataxia, dysarthria, cognitive impairment and visual impairment which have been stable without worsening Was having caregiver working on exercises consistently with noticeable improvement but unfortunately but she has since left that position.  Continues to have caregiver 4 days weekly and daughter assisting in between.  Previously was cared for by her husband but unfortunately he is currently hospitalized. She ambulates with standing walker short distance otherwise transfers via w/c.  Denies any recent falls Denies new stroke/TIA symptoms  Remains on aspirin 81 mg daily without bleeding or bruising Remains on Crestor 40 mg daily without myalgias Blood pressure today 112/72 Loop recorder has not shown atrial fibrillation thus far  No further concerns    History provided for reference purposes only Update 02/08/2020 JM: [name] is a 79 year old female who is being seen today, 02/08/2020, for stroke follow-up accompanied by her daughter. Residual deficits of left-sided dysmetria, dysarthria, cognitive impairment and visual impairment. Patient did see opthalmologist Dr Katy Fitch in December 2020 and will follow up with him in 6 months to see if vision has improved. She recently started to ambulate short distances with a walker and assistance but normally uses the wheelchair for transportation.  Continues on aspirin 81 mg and  Crestor for secondary stroke prevention without side effects. Blood pressure today 108/65.  Loop recorder is not shown atrial fibrillation thus far.  No further concerns at this time.  Update 10/05/2019 JM: [name] is a 79 year old female who is being seen today for stroke follow-up accompanied by her daughter.  Residual deficits of left-sided dysmetria, dysarthria and blurred vision.  She has since returned home from nursing home and currently lives with her husband with assistance from her daughter during the day.  Recently released from speech and occupational therapy but continues to receive physical therapy with ongoing improvement.  She has not had follow-up with ophthalmology at this time regarding visual loss.  Continues on aspirin 81 mg and Crestor for secondary stroke prevention without side effects.  Blood pressure today 111/64.  Loop recorder placed by cardiology on 08/10/2019 which has not shown atrial fibrillation at this time.  No further concerns at this time.  Virtual visit 06/26/2019 JM: Residual deficits blurred vision (increased difficulty close up), balance deficits, mild cognitive impairment and dysphagia.  All residual deficits have been improving and she continues to participate in therapies. Did have FEES and recommend NTL but speech therapy felt it was not safe due to continued choking therefore went back to HTL. Ambulation has improved with mild leaning towards right with use of RW with therapy but uses WC while not with therapies for transportation.  Mild processing delay but overall improved. She plans on leaving facility today to return home with husband and daughter with recommendations of home health therapies Continues on aspirin 81 mg without bleeding or bruising Continues on Crestor 10 mg without myalgias Blood pressure  110/58 Cardiology appointment 07/11/2019 and likely will discuss possible loop recorder placement No concerns at this time.  Denies new or worsening  stroke/TIA symptoms.  Stroke admission 05/10/2019: [name] is a 79 y.o. female with history of breast cancer s/p mastectomy on 05/09/2019 and smoker who was discharged on 05/10/2019, did well for 2 days then became weak and lethargic on 05/13/2019, developing confusion and decreased p.o. intake.  Per review of note, husband initially believed symptoms are secondary to recent surgery and residual effects of anesthesia.  Symptoms worsened and EMS called on 05/15/2019.  She presented to [facility] ED with CT showing bilateral cerebellar and left occipital lobe hypoattenuation.  MRI showed multiple bilateral posterior circulation cerebellar infarcts in the occipital lobes bilaterally, left thalamus and left tectum.  MRI showed diffuse intracranial arthrosclerosis.  Transferred to [facility] for further evaluation and management.  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  2D echo showed an EF of 60 to 65% without cardiac source of embolus identified and negative bubble.  TEE positive for PFO and bidirectional shunt at rest but no evidence of vegetation or clots.  Lower extremity venous Doppler negative for DVT.  Recommended loop recorder for atrial fibrillation monitoring once complete healing of recent mastectomy wound and plans on following with cardiology outpatient.  LDL 139 and A1c 5.6.  Initiated aspirin and rosuvastatin for secondary stroke prevention.  Other stroke risk factors include advanced age, tobacco use and EtOH use.  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     ROS:   14 system review of systems performed and negative with exception of blurred vision, walking difficulty and speech difficulty  PMH:  Past Medical History:  Diagnosis Date  . Breast cancer ([place])    . Right foot pain Nov. 2014    PSH:  Past Surgical History:  Procedure Laterality Date  . BREAST BIOPSY    . BUBBLE STUDY  05/18/2019   Procedure: BUBBLE STUDY;  Surgeon: Elouise Munroe, MD;  Location: [facility] ENDOSCOPY;  Service: Cardiology;;  . CATARACT EXTRACTION     both eyes  . EYE SURGERY Left   . MASTECTOMY W/ SENTINEL NODE BIOPSY Left 05/09/2019   Procedure: LEFT MASTECTOMY WITH LEFT AXILLARY SENTINEL LYMPH NODE BIOPSY;  Surgeon: Coralie Keens, MD;  Location: [place] [facility];  Service: General;  Laterality: Left;  . TEE WITHOUT CARDIOVERSION N/A 05/18/2019   Procedure: TRANSESOPHAGEAL ECHOCARDIOGRAM (TEE);  Surgeon: Elouise Munroe, MD;  Location: [facility];  Service: Cardiology;  Laterality: N/A;    Social History:  Social History   Socioeconomic History  . Marital status: Married    Spouse name: Not on file  . Number of children: Not on file  . Years of education: Not on file  . Highest education level: Not on file  Occupational History    Comment: retired  Tobacco Use  . Smoking status: Current Every Day Smoker    Packs/day: 0.50    Years: 58.00    Pack years: 29.00    Types: Cigarettes  . Smokeless tobacco: Never Used  Vaping Use  . Vaping Use: Never used  Substance and Sexual Activity  . Alcohol use: Yes    Alcohol/week: 7.0 standard drinks    Types: 7 Cans of beer per week  Comment: 1 beer every 1-2 days  . Drug use: No  . Sexual activity: Not Currently  Other Topics Concern  . Not on file  Social History Narrative   Resides in [place]. Has one grown daughter. No grandchildren. No pets.    Social Determinants of Health   Financial Resource Strain:   . Difficulty of Paying Living Expenses: Not on file  Food Insecurity:   . Worried About Charity fundraiser in the Last Year: Not on file  . Ran Out of Food in the Last Year: Not on file  Transportation Needs:   . Lack of Transportation (Medical): Not on file  . Lack of Transportation (Non-Medical):  Not on file  Physical Activity:   . Days of Exercise per Week: Not on file  . Minutes of Exercise per Session: Not on file  Stress:   . Feeling of Stress : Not on file  Social Connections:   . Frequency of Communication with Friends and Family: Not on file  . Frequency of Social Gatherings with Friends and Family: Not on file  . Attends Religious Services: Not on file  . Active Member of Clubs or Organizations: Not on file  . Attends Archivist Meetings: Not on file  . Marital Status: Not on file  Intimate Partner Violence:   . Fear of Current or Ex-Partner: Not on file  . Emotionally Abused: Not on file  . Physically Abused: Not on file  . Sexually Abused: Not on file    Family History:  Family History  Problem Relation Age of Onset  . Cancer Mother        esophageal  . Hypertension Mother   . Varicose Veins Mother   . Cancer Sister        non hodgkins  . Cancer Other        unknown    Medications:   Current Outpatient Medications on File Prior to Visit  Medication Sig Dispense Refill  . amLODipine (NORVASC) 5 MG tablet Take 5 mg by mouth daily.    Marland Kitchen aspirin EC 81 MG tablet Take 1 tablet (81 mg total) by mouth daily. 30 tablet   . letrozole (FEMARA) 2.5 MG tablet TAKE 1 TABLET ONCE DAILY. 90 tablet 1  . mirtazapine (REMERON) 15 MG tablet Take 1 tablet (15 mg total) by mouth at bedtime.    . rosuvastatin (CRESTOR) 10 MG tablet Take 10 mg by mouth daily.     No current facility-administered medications on file prior to visit.    Allergies:   Allergies  Allergen Reactions  . Atorvastatin Other (See Comments)    Muscle pain      Physical Exam  Vitals:   08/14/20 1051  BP: 112/72  Pulse: (!) 52     General: Frail pleasant elderly Caucasian female, seated in WC, in no evident distress Head: head normocephalic and atraumatic.   Neck: supple with no carotid or supraclavicular bruits Cardiovascular: regular rate and rhythm, no  murmurs Musculoskeletal: no deformity Skin:  no rash/petichiae Vascular:  Normal pulses all extremities   Neurologic Exam Mental Status: Awake and fully alert. Moderate dysarthria.   Follow simple step commands.  Oriented to place and time. Recent and remote memory impaired. Attention span, concentration and fund of knowledge impaired with majority of history provided by daughter. Mood and affect appropriate.  Cranial Nerves: Pupils equal, briskly reactive to light. Extraocular movements full without nystagmus. Visual fields  right homonymous quadratopia  Hearing intact. Facial sensation  intact.  Left lower facial weakness.  Motor: Normal bulk and tone. Normal strength in all tested extremity muscles. Sensory.: intact to touch , pinprick , position and vibratory sensation.  Coordination: Rapid alternating movements decreased left hand. Finger-to-nose and heel-to-shin show dysmetria/ataxia in left upper and lower extremities. Gait and Station: Gait assessment deferred Reflexes: 1+ and symmetric. Toes downgoing.       ASSESSMENT: Jessica Harrell is a 79 y.o. year old female who presented to [facility] ED with somnolence and confusion on 05/15/2019 and soon transferred to [facility].  Stroke work-up revealed bilateral cerebellar, bilateral occipital, left thalamic and left abdomen infarcts embolic pattern secondary to unknown source with differentials including hypercoagulability with a cancer diagnosis or atrial fibrillation as endocarditis ruled out.  Loop recorder placed for monitoring of possible atrial fibrillation as stroke etiology. Vascular risk factors include HTN, HLD, intracranial arthrosclerosis, breast cancer s/p chemo and mastectomy, advanced age, tobacco use and EtOH use.    PLAN:  1. Cryptogenic stroke:  a. Residual deficits: Visual impairment (blurred vision and right homonymous hemianopia), dysarthria, cognitive impairment, imbalance and left-sided ataxia.  i. Continue doing  exercises at home with additional information provided regarding ataxia post stroke and further exercises b. Loop recorder has not shown atrial fibrillation thus far.  Will continue to be monitored by cardiology.   c. Continue aspirin 81 mg daily  and rosuvastatin 10 mg daily for secondary stroke prevention.   d. Discussed importance of close PCP follow-up for aggressive stroke risk factor management. 2. HTN: BP goal<130/90.  Stable.  Remains on amlodipine 5 mg daily.  Managed by PCP. 3. HLD: LDL goal<70.  Remains on Crestor 10 mg daily.  Managed by PCP.   Overall stable from stroke standpoint and recommend follow-up on an as-needed basis   I spent 30 minutes of face-to-face and non-face-to-face time with patient and daughter.  This included previsit chart review, lab review, study review, order entry, electronic health record documentation, patient education regarding cryptogenic stroke, residual deficits, importance of managing stroke risk factors and answered all questions to patient and daughters satisfaction   Frann Rider, [facility]  [facility] [address] [place], [place] [address]  Phone [phone] Fax [phone] Note: This document was prepared with digital dictation and possible smart phrase technology. Any transcriptional errors that result from this process are unintentional.

## 2020-08-14 NOTE — Patient Instructions (Signed)
Continue to do exercises for residual deficits - additional information regarding ataxia provided   Continue aspirin 81 mg daily  and Crestor  for secondary stroke prevention  Your loop recorder will continue to be monitored for possible atrial fibrillation  Continue to follow up with PCP regarding cholesterol and blood pressure management  Maintain strict control of hypertension with blood pressure goal below 130/90 and cholesterol with LDL cholesterol (bad cholesterol) goal below 70 mg/dL.          Thank you for coming to see [place] at [facility]. I hope we have been able to provide you high quality care today.  You may receive a patient satisfaction survey over the next few weeks. We would appreciate your feedback and comments so that we may continue to improve ourselves and the health of our patients.

## 2020-08-15 NOTE — Progress Notes (Signed)
Carelink Summary Report / Loop Recorder 

## 2020-09-11 LAB — CUP PACEART REMOTE DEVICE CHECK
Date Time Interrogation Session: 20211004230135
Implantable Pulse Generator Implant Date: 20200903

## 2020-09-16 ENCOUNTER — Ambulatory Visit (INDEPENDENT_AMBULATORY_CARE_PROVIDER_SITE_OTHER): Payer: Medicare Other

## 2020-09-16 DIAGNOSIS — I639 Cerebral infarction, unspecified: Secondary | ICD-10-CM | POA: Diagnosis not present

## 2020-09-18 NOTE — Progress Notes (Signed)
Carelink Summary Report / Loop Recorder 

## 2020-10-01 ENCOUNTER — Other Ambulatory Visit: Payer: Self-pay | Admitting: Hematology and Oncology

## 2020-10-13 LAB — CUP PACEART REMOTE DEVICE CHECK
Date Time Interrogation Session: 20211106230051
Implantable Pulse Generator Implant Date: 20200903

## 2020-10-19 ENCOUNTER — Emergency Department (HOSPITAL_COMMUNITY)
Admission: EM | Admit: 2020-10-19 | Discharge: 2020-10-19 | Disposition: A | Discharge status: Home / Self Care | Payer: Medicare Other | Attending: Emergency Medicine | Admitting: Emergency Medicine

## 2020-10-19 ENCOUNTER — Other Ambulatory Visit: Payer: Self-pay

## 2020-10-19 ENCOUNTER — Encounter (HOSPITAL_COMMUNITY): Payer: Self-pay | Admitting: Emergency Medicine

## 2020-10-19 DIAGNOSIS — Z853 Personal history of malignant neoplasm of breast: Secondary | ICD-10-CM | POA: Insufficient documentation

## 2020-10-19 DIAGNOSIS — Z79899 Other long term (current) drug therapy: Secondary | ICD-10-CM | POA: Diagnosis not present

## 2020-10-19 DIAGNOSIS — Z7982 Long term (current) use of aspirin: Secondary | ICD-10-CM | POA: Diagnosis not present

## 2020-10-19 DIAGNOSIS — H1131 Conjunctival hemorrhage, right eye: Secondary | ICD-10-CM | POA: Diagnosis not present

## 2020-10-19 DIAGNOSIS — Z87891 Personal history of nicotine dependence: Secondary | ICD-10-CM | POA: Insufficient documentation

## 2020-10-19 HISTORY — DX: Cerebral infarction, unspecified: I63.9

## 2020-10-19 MED ORDER — FLUORESCEIN SODIUM 1 MG OP STRP
1.0000 | ORAL_STRIP | Freq: Once | OPHTHALMIC | Status: AC
  Administered 2020-10-19: 1 via OPHTHALMIC
  Filled 2020-10-19: qty 1

## 2020-10-19 NOTE — ED Triage Notes (Signed)
Pt family reports pt woke up this am and reports rubbed her eyes and right eye started bleeding. Pt has history of stoke but denies any visual defeicits at baseline. Pt denies any vision problems at time of arrival to triage. No active bleeding noted. Pt not on blood thinners.

## 2020-10-19 NOTE — ED Provider Notes (Signed)
[facility] EMERGENCY DEPARTMENT Provider Note   CSN: [id number] Arrival date & time: 10/19/20  1023    History Chief Complaint  Patient presents with  . Eye Problem    Jessica Harrell is a 79 y.o. female with past medical history significant for ischemic stroke with residual deficits who presents for evaluation of bleeding to her right eye. Family states they woke up after the caregiver had left the patient. When patient sat up in bed they noticed she had bleeding to her right eye. Per daughter patient states she rubbed her eye this morning. Does have 20 out of 300 vision in her right eye per daughter. Apparently had some bleeding at her right tear duct. No active bleeding at this time. She is not anticoagulated. She denies any pain, weakness. She is not currently followed by ophthalmology. 0/10 pain.  History obtained from patient, daughter in room and past medical records. No interpreter is used.  Per daughter patient at baseline mentation.  HPI     Past Medical History:  Diagnosis Date  . Breast cancer ([place])   . Right foot pain Nov. 2014  . Stroke [facility])     Patient Active Problem List   Diagnosis Date Noted  . Acute ischemic stroke ([place]) 05/15/2019  . Acute metabolic encephalopathy 19/14/7829  . Encephalopathy   . History of left breast cancer 05/09/2019  . Malignant neoplasm of lower-inner quadrant of left breast in female, estrogen receptor positive ([place]) 04/26/2018    Past Surgical History:  Procedure Laterality Date  . BREAST BIOPSY    . BUBBLE STUDY  05/18/2019   Procedure: BUBBLE STUDY;  Surgeon: Elouise Munroe, MD;  Location: [facility] ENDOSCOPY;  Service: Cardiology;;  . CATARACT EXTRACTION     both eyes  . EYE SURGERY Left   . MASTECTOMY W/ SENTINEL NODE BIOPSY Left 05/09/2019   Procedure: LEFT MASTECTOMY WITH LEFT AXILLARY SENTINEL LYMPH NODE BIOPSY;  Surgeon: Coralie Keens, MD;  Location: [place];  Service: General;  Laterality: Left;  . TEE WITHOUT  CARDIOVERSION N/A 05/18/2019   Procedure: TRANSESOPHAGEAL ECHOCARDIOGRAM (TEE);  Surgeon: Elouise Munroe, MD;  Location: [place];  Service: Cardiology;  Laterality: N/A;     OB History   No obstetric history on file.     Family History  Problem Relation Age of Onset  . Cancer Mother        esophageal  . Hypertension Mother   . Varicose Veins Mother   . Cancer Sister        non hodgkins  . Cancer Other        unknown    Social History   Tobacco Use  . Smoking status: Former Smoker    Packs/day: 0.50    Years: 58.00    Pack years: 29.00    Types: Cigarettes  . Smokeless tobacco: Never Used  Vaping Use  . Vaping Use: Never used  Substance Use Topics  . Alcohol use: Not Currently    Alcohol/week: 7.0 standard drinks    Types: 7 Cans of beer per week    Comment: 1 beer every 1-2 days  . Drug use: No    Home Medications Prior to Admission medications   Medication Sig Start Date End Date Taking? Authorizing Provider  amLODipine (NORVASC) 5 MG tablet Take 5 mg by mouth daily. 09/26/19   [provider]  aspirin EC 81 MG tablet Take 1 tablet (81 mg total) by mouth daily. 08/08/20   Nicholas Lose, MD  letrozole (FEMARA) 2.5 MG tablet TAKE 1 TABLET ONCE DAILY. 10/01/20   Nicholas Lose, MD  mirtazapine (REMERON) 15 MG tablet Take 1 tablet (15 mg total) by mouth at bedtime. 08/09/19   Nicholas Lose, MD  rosuvastatin (CRESTOR) 10 MG tablet Take 10 mg by mouth daily.    [provider]    Allergies    Atorvastatin  Review of Systems   Review of Systems  Constitutional: Negative.   HENT: Negative.   Eyes: Positive for discharge and redness. Negative for pain.  Respiratory: Negative.   Cardiovascular: Negative.   Gastrointestinal: Negative.   Genitourinary: Negative.   Musculoskeletal: Negative.   Skin: Negative.   Neurological: Negative.   All other systems reviewed and are negative.   Physical Exam Updated Vital Signs BP 106/67 (BP  Location: Right Arm)   Pulse 86   Temp (!) 97.5 F (36.4 C) (Oral)   Resp 18   Ht 5\' 7"  (1.702 m)   Wt 45.4 kg   SpO2 96%   BMI 15.66 kg/m   Physical Exam Vitals and nursing note reviewed.  Constitutional:      General: She is not in acute distress.    Appearance: She is well-developed. She is not ill-appearing, toxic-appearing or diaphoretic.  HENT:     Head: Normocephalic and atraumatic.     Nose: Nose normal.     Mouth/Throat:     Mouth: Mucous membranes are moist.  Eyes:     General: Lids are everted, no foreign bodies appreciated.        Right eye: No foreign body, discharge or hordeolum.        Left eye: No foreign body, discharge or hordeolum.     Extraocular Movements:     Right eye: Nystagmus present. Normal extraocular motion.     Left eye: Nystagmus present. Normal extraocular motion.     Conjunctiva/sclera:     Right eye: Right conjunctiva is not injected. Hemorrhage present. No chemosis or exudate.    Left eye: Left conjunctiva is not injected. No chemosis, exudate or hemorrhage.    Pupils: Pupils are equal, round, and reactive to light.     Slit lamp exam:    Right eye: Anterior chamber quiet.     Left eye: Anterior chamber quiet.     Comments: Subconjunctival hemorrhage to right eye approximately 50% lower eye. There is dried blood at her right tear trough however I do not see active bleeding. No florescence up take. Negative seidel sign. No eyelid lacerations. No hyphema. No corneal involvement.  Suboptimal exam due to patient prior CVA and nystagmus  Cardiovascular:     Rate and Rhythm: Normal rate.     Pulses: Normal pulses.     Heart sounds: Normal heart sounds.  Pulmonary:     Effort: Pulmonary effort is normal. No respiratory distress.     Breath sounds: Normal breath sounds.  Abdominal:     General: Bowel sounds are normal. There is no distension.  Musculoskeletal:        General: Normal range of motion.     Cervical back: Full passive range of  motion without pain and normal range of motion.  Skin:    General: Skin is warm and dry.  Neurological:     Mental Status: She is alert.    ED Results / Procedures / Treatments   Labs (all labs ordered are listed, but only abnormal results are displayed) Labs Reviewed - No data to display  EKG None  Radiology No results found.  Procedures Procedures (including critical care time)  Medications Ordered in ED Medications  fluorescein ophthalmic strip 1 strip (1 strip Right Eye Given 10/19/20 1256)   ED Course  I have reviewed the triage vital signs and the nursing notes.  Pertinent labs & imaging results that were available during my care of the patient were reviewed by me and considered in my medical decision making (see chart for details).  79 year old history of cryptogenic stroke with residual deficits to presents for evaluation of bleeding to right eye.  Patient has dysphagia and has difficulty communicating.  Per daughter she went to check on patient.  She rubbed her eye and subsequently developed some bleeding to her right eye.  She has moderate subconjunctival hemorrhage.  Daughter states that patient did have small bleeding to her right lateral eye which she was able to wipe away.  Unfortunately has significant visual field deficits to her right eye at baseline from her prior CVA.  Unable to obtain visual acuity due to patient's baseline vision loss.  Patient denies any pain.  Patient with negative Seidel sign.  No fluorescein uptake to eye.  No active bleeding to eye.  I do not see any evidence of ruptured globe.  Patient examined, history consistent with subconjunctival hemorrhage.  Reassuring slit lamp and staining exam.  Doubt ruptured globe, infectious process, corneal abrasion, and trapped orbit.  We will have her follow-up with ophthalmology.  The patient has been appropriately medically screened and/or stabilized in the ED. I have low suspicion for any other emergent  medical condition which would require further screening, evaluation or treatment in the ED or require inpatient management.  Patient is hemodynamically stable and in no acute distress.  Patient able to ambulate in department prior to ED.  Evaluation does not show acute pathology that would require ongoing or additional emergent interventions while in the emergency department or further inpatient treatment.  I have discussed the diagnosis with the patient and answered all questions.  Pain is been managed while in the emergency department and patient has no further complaints prior to discharge.  Patient is comfortable with plan discussed in room and is stable for discharge at this time.  I have discussed strict return precautions for returning to the emergency department.  Patient was encouraged to follow-up with PCP/specialist refer to at discharge.  Patient seen eval by attending, Dr. Langston Masker  who agrees above treatment, plan and disposition.  Clinical Course as of Oct 19 1356  Sat Oct 19, 2020  4124 This 79 year old female with a history of dementia presenting to the ED in the presence of a family member with concern for redness around her right eye.  Daughter is concerned the patient may have poked her eye this morning.  She noted there appeared to be bleeding around the eye.  On exam the patient is comfortable.  She has no acute pain in her eye.  Her eyes are wide open.  She suffers from severe dementia and therefore is intermittently compliant with exam.  She does appear to have a subconjunctival hemorrhage.  There is no pupillary involvement.  There is no hyphema or hypopion.  PA provider performed fluorescein staining and the patient had no obvious evidence of corneal abrasion.  I doubt there is a ruptured globe with this exam.  She is okay for discharge.   [MT]    Clinical Course User Index [MT] Trifan, Carola Rhine, MD   MDM Rules/Calculators/A&P  Final Clinical  Impression(s) / ED Diagnoses Final diagnoses:  Subconjunctival hemorrhage of right eye    Rx / DC Orders ED Discharge Orders    None       Sharnice Bosler A, PA-C 10/19/20 1357    Wyvonnia Dusky, MD 10/19/20 1750

## 2020-10-19 NOTE — ED Notes (Signed)
Awakened this am with bleeding to sclera to her R eye  Sclera with blood noted   Attempt to ascertain vision unsuccessful  Pt unable to read nor report the letter pointed to   Pt w hx of CVA

## 2020-10-19 NOTE — Discharge Instructions (Signed)
Follow-up with ophthalmology in 3 to 4 days for reevaluation.  Their contact information is listed in your discharge paperwork.  Return for any worsening symptoms.

## 2020-10-21 ENCOUNTER — Ambulatory Visit (INDEPENDENT_AMBULATORY_CARE_PROVIDER_SITE_OTHER): Payer: Medicare Other

## 2020-10-21 DIAGNOSIS — I639 Cerebral infarction, unspecified: Secondary | ICD-10-CM | POA: Diagnosis not present

## 2020-10-23 NOTE — Progress Notes (Signed)
Carelink Summary Report / Loop Recorder 

## 2020-11-25 ENCOUNTER — Ambulatory Visit (INDEPENDENT_AMBULATORY_CARE_PROVIDER_SITE_OTHER): Payer: Medicare Other

## 2020-11-25 DIAGNOSIS — I639 Cerebral infarction, unspecified: Secondary | ICD-10-CM | POA: Diagnosis not present

## 2020-11-25 LAB — CUP PACEART REMOTE DEVICE CHECK
Date Time Interrogation Session: 20211218230230
Implantable Pulse Generator Implant Date: 20200903

## 2020-12-09 NOTE — Progress Notes (Signed)
Carelink Summary Report / Loop Recorder 

## 2020-12-29 LAB — CUP PACEART REMOTE DEVICE CHECK
Date Time Interrogation Session: 20220120230405
Implantable Pulse Generator Implant Date: 20200903

## 2021-02-01 LAB — CUP PACEART REMOTE DEVICE CHECK
Date Time Interrogation Session: 20220222230558
Implantable Pulse Generator Implant Date: 20200903

## 2021-02-03 ENCOUNTER — Ambulatory Visit (INDEPENDENT_AMBULATORY_CARE_PROVIDER_SITE_OTHER): Payer: Medicare Other

## 2021-02-03 DIAGNOSIS — I639 Cerebral infarction, unspecified: Secondary | ICD-10-CM

## 2021-02-11 NOTE — Progress Notes (Signed)
Carelink Summary Report / Loop Recorder 

## 2021-03-03 ENCOUNTER — Ambulatory Visit (INDEPENDENT_AMBULATORY_CARE_PROVIDER_SITE_OTHER): Payer: Medicare Other

## 2021-03-03 DIAGNOSIS — I639 Cerebral infarction, unspecified: Secondary | ICD-10-CM

## 2021-03-03 LAB — CUP PACEART REMOTE DEVICE CHECK
Date Time Interrogation Session: 20220327230651
Implantable Pulse Generator Implant Date: 20200903

## 2021-03-17 NOTE — Progress Notes (Signed)
Carelink Summary Report / Loop Recorder 

## 2021-04-07 ENCOUNTER — Ambulatory Visit (INDEPENDENT_AMBULATORY_CARE_PROVIDER_SITE_OTHER): Payer: Medicare Other

## 2021-04-07 ENCOUNTER — Other Ambulatory Visit: Payer: Self-pay | Admitting: Hematology and Oncology

## 2021-04-07 DIAGNOSIS — I639 Cerebral infarction, unspecified: Secondary | ICD-10-CM

## 2021-04-09 LAB — CUP PACEART REMOTE DEVICE CHECK
Date Time Interrogation Session: 20220429230234
Implantable Pulse Generator Implant Date: 20200903

## 2021-04-29 NOTE — Progress Notes (Signed)
Carelink Summary Report / Loop Recorder 

## 2021-05-11 LAB — CUP PACEART REMOTE DEVICE CHECK
Date Time Interrogation Session: 20220601230557
Implantable Pulse Generator Implant Date: 20200903

## 2021-05-12 ENCOUNTER — Ambulatory Visit (INDEPENDENT_AMBULATORY_CARE_PROVIDER_SITE_OTHER): Payer: Medicare Other

## 2021-05-12 DIAGNOSIS — I639 Cerebral infarction, unspecified: Secondary | ICD-10-CM | POA: Diagnosis not present

## 2021-06-03 NOTE — Progress Notes (Signed)
Carelink Summary Report / Loop Recorder 

## 2021-06-16 ENCOUNTER — Ambulatory Visit (INDEPENDENT_AMBULATORY_CARE_PROVIDER_SITE_OTHER): Payer: Medicare Other

## 2021-06-16 DIAGNOSIS — I639 Cerebral infarction, unspecified: Secondary | ICD-10-CM | POA: Diagnosis not present

## 2021-06-16 LAB — CUP PACEART REMOTE DEVICE CHECK
Date Time Interrogation Session: 20220704230837
Implantable Pulse Generator Implant Date: 20200903

## 2021-07-07 ENCOUNTER — Encounter (HOSPITAL_COMMUNITY): Payer: Self-pay | Admitting: Emergency Medicine

## 2021-07-07 ENCOUNTER — Emergency Department (HOSPITAL_COMMUNITY)
Admission: EM | Admit: 2021-07-07 | Discharge: 2021-07-08 | Disposition: A | Discharge status: Home / Self Care | Payer: Medicare Other | Attending: Emergency Medicine | Admitting: Emergency Medicine

## 2021-07-07 ENCOUNTER — Emergency Department (HOSPITAL_COMMUNITY): Payer: Medicare Other

## 2021-07-07 ENCOUNTER — Other Ambulatory Visit: Payer: Self-pay

## 2021-07-07 DIAGNOSIS — Z87891 Personal history of nicotine dependence: Secondary | ICD-10-CM | POA: Insufficient documentation

## 2021-07-07 DIAGNOSIS — R479 Unspecified speech disturbances: Secondary | ICD-10-CM | POA: Diagnosis not present

## 2021-07-07 DIAGNOSIS — R410 Disorientation, unspecified: Secondary | ICD-10-CM | POA: Diagnosis not present

## 2021-07-07 DIAGNOSIS — R269 Unspecified abnormalities of gait and mobility: Secondary | ICD-10-CM | POA: Diagnosis not present

## 2021-07-07 DIAGNOSIS — Z853 Personal history of malignant neoplasm of breast: Secondary | ICD-10-CM | POA: Diagnosis not present

## 2021-07-07 DIAGNOSIS — R531 Weakness: Secondary | ICD-10-CM

## 2021-07-07 LAB — URINALYSIS, ROUTINE W REFLEX MICROSCOPIC
Bilirubin Urine: NEGATIVE
Glucose, UA: NEGATIVE mg/dL
Ketones, ur: NEGATIVE mg/dL
Nitrite: NEGATIVE
Protein, ur: NEGATIVE mg/dL
Specific Gravity, Urine: 1.01 (ref 1.005–1.030)
pH: 6 (ref 5.0–8.0)

## 2021-07-07 LAB — CBC WITH DIFFERENTIAL/PLATELET
Abs Immature Granulocytes: 0.03 10*3/uL (ref 0.00–0.07)
Basophils Absolute: 0.1 10*3/uL (ref 0.0–0.1)
Basophils Relative: 1 %
Eosinophils Absolute: 0.4 10*3/uL (ref 0.0–0.5)
Eosinophils Relative: 4 %
HCT: 40.2 % (ref 36.0–46.0)
Hemoglobin: 13.1 g/dL (ref 12.0–15.0)
Immature Granulocytes: 0 %
Lymphocytes Relative: 29 %
Lymphs Abs: 2.5 10*3/uL (ref 0.7–4.0)
MCH: 31.4 pg (ref 26.0–34.0)
MCHC: 32.6 g/dL (ref 30.0–36.0)
MCV: 96.4 fL (ref 80.0–100.0)
Monocytes Absolute: 0.4 10*3/uL (ref 0.1–1.0)
Monocytes Relative: 5 %
Neutro Abs: 5.2 10*3/uL (ref 1.7–7.7)
Neutrophils Relative %: 61 %
Platelets: 208 10*3/uL (ref 150–400)
RBC: 4.17 MIL/uL (ref 3.87–5.11)
RDW: 12.8 % (ref 11.5–15.5)
WBC: 8.6 10*3/uL (ref 4.0–10.5)
nRBC: 0 % (ref 0.0–0.2)

## 2021-07-07 LAB — COMPREHENSIVE METABOLIC PANEL
ALT: 19 U/L (ref 0–44)
AST: 20 U/L (ref 15–41)
Albumin: 3.6 g/dL (ref 3.5–5.0)
Alkaline Phosphatase: 74 U/L (ref 38–126)
Anion gap: 6 (ref 5–15)
BUN: 21 mg/dL (ref 8–23)
CO2: 28 mmol/L (ref 22–32)
Calcium: 8.8 mg/dL — ABNORMAL LOW (ref 8.9–10.3)
Chloride: 103 mmol/L (ref 98–111)
Creatinine, Ser: 0.94 mg/dL (ref 0.44–1.00)
GFR, Estimated: >60 mL/min (ref >60)
Glucose, Bld: 102 mg/dL — ABNORMAL HIGH (ref 70–99)
Potassium: 4.2 mmol/L (ref 3.5–5.1)
Sodium: 137 mmol/L (ref 135–145)
Total Bilirubin: 0.6 mg/dL (ref 0.3–1.2)
Total Protein: 6.8 g/dL (ref 6.5–8.1)

## 2021-07-07 LAB — LIPASE, BLOOD: Lipase: 40 U/L (ref 11–51)

## 2021-07-07 MED ORDER — SODIUM CHLORIDE 0.9 % IV SOLN
1.0000 g | Freq: Once | INTRAVENOUS | Status: AC
  Administered 2021-07-07: 1 g via INTRAVENOUS
  Filled 2021-07-07: qty 10

## 2021-07-07 MED ORDER — SODIUM CHLORIDE 0.9 % IV BOLUS
500.0000 mL | Freq: Once | INTRAVENOUS | Status: AC
  Administered 2021-07-07: 500 mL via INTRAVENOUS

## 2021-07-07 MED ORDER — CEPHALEXIN 500 MG PO CAPS: 500.0000 mg | ORAL_CAPSULE | Freq: Two times a day (BID) | ORAL | 0 refills | Status: AC

## 2021-07-07 NOTE — ED Triage Notes (Signed)
Family reports hx of stroke in 2020; pt has some deficits from previous stroke but family feels speech is different and pt is more confused than normal x 2 days; family also reports pt has only urinated twice today and it was dark and has a foul odor; EDP at bedside for MSE

## 2021-07-07 NOTE — ED Notes (Signed)
Patient transported to CT 

## 2021-07-07 NOTE — ED Provider Notes (Signed)
Emergency Department Provider Note   I have reviewed the triage vital signs and the nursing notes.   HISTORY  Chief Complaint Altered Mental Status   HPI Jessica Harrell is a 80 y.o. female with PMH of prior CVA presents to the emergency department with question of worsening speech difficulty and some gait instability.  Patient's daughter is at bedside who cares for her, mainly on the weekends. She notes symptoms developed mainly over the weekend.  She felt like her speech was a little more difficult to understand compared to her normal.  She had trouble with standing and pivoting at times although here in the emergency department seems to be doing that well.  She is unsure if this is related to a possible new stroke but also have some concern for dehydration and possibly a urine infection.  She is noticed today that the patient's urine has been much more dark and foul-smelling.  They have tried to increase fluid intake at home.    Past Medical History:  Diagnosis Date   Breast cancer ([place])    Right foot pain Nov. 2014   Stroke [facility])     Patient Active Problem List   Diagnosis Date Noted   Acute ischemic stroke ([place]) [id number]   Acute metabolic encephalopathy [id number]   Encephalopathy    History of left breast cancer 05/09/2019   Malignant neoplasm of lower-inner quadrant of left breast in female, estrogen receptor positive ([place]) 04/26/2018    Past Surgical History:  Procedure Laterality Date   BREAST BIOPSY     BUBBLE STUDY  05/18/2019   Procedure: BUBBLE STUDY;  Surgeon: Elouise Munroe, MD;  Location: [place];  Service: Cardiology;;   CATARACT EXTRACTION     both eyes   EYE SURGERY Left    MASTECTOMY W/ SENTINEL NODE BIOPSY Left 05/09/2019   Procedure: LEFT MASTECTOMY WITH LEFT AXILLARY SENTINEL LYMPH NODE BIOPSY;  Surgeon: Coralie Keens, MD;  Location: [place];  Service: General;  Laterality: Left;   TEE WITHOUT CARDIOVERSION N/A 05/18/2019   Procedure:  TRANSESOPHAGEAL ECHOCARDIOGRAM (TEE);  Surgeon: Elouise Munroe, MD;  Location: [place];  Service: Cardiology;  Laterality: N/A;    Allergies Atorvastatin  Family History  Problem Relation Age of Onset   Cancer Mother        esophageal   Hypertension Mother    Varicose Veins Mother    Cancer Sister        non hodgkins   Cancer Other        unknown    Social History Social History   Tobacco Use   Smoking status: Former    Packs/day: 0.50    Years: 58.00    Pack years: 29.00    Types: Cigarettes   Smokeless tobacco: Never  Vaping Use   Vaping Use: Never used  Substance Use Topics   Alcohol use: Not Currently    Alcohol/week: 7.0 standard drinks    Types: 7 Cans of beer per week    Comment: 1 beer every 1-2 days   Drug use: No    Review of Systems  Constitutional: No fever/chills Eyes: No visual changes. ENT: No sore throat. Cardiovascular: Denies chest pain. Respiratory: Denies shortness of breath. Gastrointestinal: No abdominal pain.  No nausea, no vomiting.  No diarrhea.  No constipation. Genitourinary: Negative for dysuria. Musculoskeletal: Negative for back pain. Skin: Negative for rash. Neurological: Negative for headaches, focal weakness or numbness.  10-point ROS otherwise negative.  ____________________________________________   PHYSICAL  EXAM:  VITAL SIGNS: Vitals:   07/07/21 2230 07/07/21 2300  BP: 97/72 111/66  Pulse: 85 70  Resp: (!) 21 20  Temp:    SpO2: 94% 94%   Constitutional: Alert with mild confusion. Well appearing and in no acute distress. Eyes: Conjunctivae are normal. PERRL. EOMI.  Head: Atraumatic. Nose: No congestion/rhinnorhea. Mouth/Throat: Mucous membranes are dry.  Neck: No stridor.   Cardiovascular: Normal rate, regular rhythm. Good peripheral circulation. Grossly normal heart sounds.   Respiratory: Normal respiratory effort.  No retractions. Lungs CTAB. Gastrointestinal: Soft and nontender. No distention.   Musculoskeletal: No lower extremity tenderness nor edema. No gross deformities of extremities. Neurologic: Patient's speech is slightly delayed but overall understandable.  She has no facial asymmetry.  5/5 strength in the bilateral upper and lower extremities with no sensory deficits.  Skin:  Skin is warm, dry and intact. No rash noted.   ____________________________________________   LABS (all labs ordered are listed, but only abnormal results are displayed)  Labs Reviewed  COMPREHENSIVE METABOLIC PANEL - Abnormal; Notable for the following components:      Result Value   Glucose, Bld 102 (*)    Calcium 8.8 (*)    All other components within normal limits  URINALYSIS, ROUTINE W REFLEX MICROSCOPIC - Abnormal; Notable for the following components:   Hgb urine dipstick SMALL (*)    Leukocytes,Ua LARGE (*)    Bacteria, UA RARE (*)    All other components within normal limits  RESP PANEL BY RT-PCR (FLU A&B, COVID) [id number]  CBC WITH DIFFERENTIAL/PLATELET  LIPASE, BLOOD   ____________________________________________  RADIOLOGY  CT Head Wo Contrast  Result Date: 07/07/2021 CLINICAL DATA:  Mental status change, unknown cause EXAM: CT HEAD WITHOUT CONTRAST TECHNIQUE: Contiguous axial images were obtained from the base of the skull through the vertex without intravenous contrast. COMPARISON:  Head CT and brain MRI 05/15/2019 FINDINGS: Brain: Motion artifact limitations. Remote infarcts in the right frontal lobe, left occipital lobe, and bilateral cerebellum, left greater than right. Remote lacunar infarct in the right caudate. Tiny remote bilateral basal gangliar lacunar infarcts. No evidence of acute ischemia. No hemorrhage. Stable atrophy and chronic small vessel ischemia. No subdural or extra-axial collection. Vascular: Atherosclerosis of skullbase vasculature without hyperdense vessel or abnormal calcification. Skull: No fracture or focal lesion. Sinuses/Orbits: There is new opacification  of lower right mastoid air cells with progressive opacification of lower left mastoid air cells. New mucosal thickening and bubbly debris in left side of sphenoid sinus. Bilateral cataract resection. Other: None. IMPRESSION: 1. No acute intracranial abnormality. 2. Stable atrophy and chronic small vessel ischemia. Remote infarcts in the right frontal lobe, left occipital lobe, and bilateral cerebellum, left greater than right. 3. New opacification of lower right mastoid air cells with progressive opacification of lower left mastoid air cells. New mucosal thickening and bubbly debris in left side of sphenoid sinus. Recommend correlation for acute sinusitis symptoms. Electronically Signed   By: Keith Rake M.D.   On: 07/07/2021 19:47    ____________________________________________   PROCEDURES  Procedure(s) performed:   Procedures  None  ____________________________________________   INITIAL IMPRESSION / ASSESSMENT AND PLAN / ED COURSE  Pertinent labs & imaging results that were available during my care of the patient were reviewed by me and considered in my medical decision making (see chart for details).   Patient presents emergency department with generalized weakness and fatigue symptoms.  Question is speech disturbance but on my exam fairly clear.  Patient's family states this  is on and off and has been related to urine infections as well as dehydration in the past.  Patient does have some baseline neurodeficits from her prior stroke but these seem relatively stable on exam.   CT imaging of the head shows remote infarcts but no acute findings.  Question some sinusitis but patient not having significant symptoms.  She is feeling better after IV fluids.  UA does show some evidence of a urine infection which I do plan to send for culture but will treat here with Rocephin and then home with Keflex.   Patient's daughter feels comfortable with plan for discharge home and close PCP follow-up  with antibiotics and return with any new or suddenly worsening symptoms.   ____________________________________________  FINAL CLINICAL IMPRESSION(S) / ED DIAGNOSES  Final diagnoses:  Disorientation  Generalized weakness     MEDICATIONS GIVEN DURING THIS VISIT:  Medications  cefTRIAXone (ROCEPHIN) 1 g in sodium chloride 0.9 % 100 mL IVPB (1 g Intravenous New Bag/Given 07/07/21 2314)  sodium chloride 0.9 % bolus 500 mL (0 mLs Intravenous Stopped 07/07/21 2316)     NEW OUTPATIENT MEDICATIONS STARTED DURING THIS VISIT:  New Prescriptions   CEPHALEXIN (KEFLEX) 500 MG CAPSULE    Take 1 capsule (500 mg total) by mouth 2 (two) times daily for 7 days.    Note:  This document was prepared using Dragon voice recognition software and may include unintentional dictation errors.  Nanda Quinton, MD, [facility] Emergency Medicine    Schylar Allard, [facility], MD 07/07/21 [phone]

## 2021-07-07 NOTE — Discharge Instructions (Addendum)
You were seen in the emergency room today after urinary tract infection likely causing some weakness and gait instability.  The CT scan did not show any obvious stroke or bleeding.  You may have some dehydration as well which was also treated here.  I am sending you home with antibiotics for the next week.  If your symptoms change or suddenly worsen you should return to the emergency department for reevaluation.

## 2021-07-09 NOTE — Progress Notes (Signed)
Carelink Summary Report / Loop Recorder 

## 2021-07-21 ENCOUNTER — Ambulatory Visit (INDEPENDENT_AMBULATORY_CARE_PROVIDER_SITE_OTHER): Payer: Medicare Other

## 2021-07-21 DIAGNOSIS — I639 Cerebral infarction, unspecified: Secondary | ICD-10-CM

## 2021-07-22 LAB — CUP PACEART REMOTE DEVICE CHECK
Date Time Interrogation Session: 20220814230233
Implantable Pulse Generator Implant Date: 20200903

## 2021-08-06 NOTE — Progress Notes (Signed)
HEMATOLOGY-ONCOLOGY MYCHART VIDEO VISIT PROGRESS NOTE  I connected with Jessica Harrell on 08/08/2021 at  9:30 AM EDT by MyChart video conference and verified that I am speaking with the correct person using two identifiers.  I discussed the limitations, risks, security and privacy concerns of performing an evaluation and management service by MyChart and the availability of in person appointments.  I also discussed with the patient that there may be a patient responsible charge related to this service. The patient expressed understanding and agreed to proceed.  Patient's Location: Home Physician Location: Clinic  CHIEF COMPLIANT:  Follow-up of left breast cancer on letrozole  INTERVAL HISTORY: Jessica Harrell is a 80 y.o. female with above-mentioned history of left breast cancer treated with neoadjuvant antiestrogen therapy, left mastectomy, and who is currently on antiestrogen therapy with letrozole. She presents to the clinic today for annual follow-up.  Patient has extremely poor performance status and mostly stays in bed and does not participate in much activity.  She has a caregiver at home today.  Even though she reports that she has no problems tolerating letrozole, after much deliberation we decided to discontinue further antiestrogen therapy because of her quality of life issues.  She will no longer need mammograms either.   Oncology History  Malignant neoplasm of lower-inner quadrant of left breast in female, estrogen receptor positive ([place])  04/12/2018 Initial Diagnosis   Palpable mass lower inner quadrant left breast approximately measures 6 cm, biopsy ([id number]) revealed grade 1 IDC with extracellular mucin, ER 100%, PR 5%, HER-2 negative ratio 1.23, Ki-67 15% 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; stage II a clinical stage AJCC 8   04/27/2018 Cancer Staging   Staging form: Breast, AJCC 8th Edition - Clinical: Stage IIA (cT3, cN0, cM0, G2, ER+, PR+,  HER2-) - Signed by Eppie Gibson, MD on 04/27/2018   04/27/2018 - 04/2025 Anti-estrogen oral therapy   Neoadjuvant antiestrogen therapy with letrozole 2.5 mg daily   05/09/2019 Surgery   Left mastectomy Ninfa Linden) [phone]): residual IDC, grade 2, 1.4cm, HER-2 - (1+), ER+ 100%, PR -, Ki67 5%, clear margins, with a 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   08/09/2019 Cancer Staging   Staging form: Breast, AJCC 8th Edition - Pathologic stage from 08/09/2019: Stage IA (pT1c, pN0, cM0, G2, ER+, PR-, HER2-) - Signed by Nicholas Lose, MD on 08/09/2019     Observations/Objective:  There were no vitals filed for this visit. There is no height or weight on file to calculate BMI.  I have reviewed the data as listed CMP Latest Ref Rng & Units 07/07/2021 05/15/2019 05/05/2019  Glucose 70 - 99 mg/dL 102(H) 159(H) 105(H)  BUN 8 - 23 mg/dL '21 16 9  ' Creatinine 0.44 - 1.00 mg/dL 0.94 0.69 0.92  Sodium 135 - 145 mmol/L 137 133(L) 134(L)  Potassium 3.5 - 5.1 mmol/L 4.2 3.9 4.1  Chloride 98 - 111 mmol/L 103 95(L) 100  CO2 22 - 32 mmol/L 28 20(L) 25  Calcium 8.9 - 10.3 mg/dL 8.8(L) 9.2 9.4  Total Protein 6.5 - 8.1 g/dL 6.8 7.2 -  Total Bilirubin 0.3 - 1.2 mg/dL 0.6 1.2 -  Alkaline Phos 38 - 126 U/L 74 58 -  AST 15 - 41 U/L 20 20 -  ALT 0 - 44 U/L 19 16 -    Lab Results  Component Value Date   WBC 8.6 07/07/2021   HGB 13.1 07/07/2021   HCT 40.2 07/07/2021   MCV  96.4 07/07/2021   PLT 208 07/07/2021   NEUTROABS 5.2 07/07/2021      Assessment Plan:  Malignant neoplasm of lower-inner quadrant of left breast in female, estrogen receptor positive ([place]) 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; stage II a clinical stage AJCC 8   Treatment plan: 1.   Neoadjuvant antiestrogen therapy with letrozole 2.5 mg daily started 04/26/2018- 05/09/2019 2. 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 Followed by antiestrogen therapy x7 years with letrozole ----------------------------------------------------------------------------- Hospitalization June 2020: Multiple strokes, rehabilitation, patient is blind and requires wheelchair. Her husband and daughter are worn out taking care of her.  They have a caregiver that can help once in a while.   Current treatment: Letrozole 2.5 mg daily. Started 04/27/2018 stopped 08/08/2021 Even though she is tolerating letrozole reasonably well, her performance status declined considerably and she does not participate in activities at home and therefore I recommended stopping letrozole and avoiding any further mammogram evaluations.   Return to clinic on an as-needed basis  I discussed the assessment and treatment plan with the patient. The patient was provided an opportunity to ask questions and all were answered. The patient agreed with the plan and demonstrated an understanding of the instructions. The patient was advised to call back or seek an in-person evaluation if the symptoms worsen or if the condition fails to improve as anticipated.   Total time spent: 20 minutes including face-to-face MyChart video visit time and time spent for planning, charting and coordination of care  Rulon Eisenmenger, MD 08/08/2021  I, Thana Ates am acting as scribe for Nicholas Lose, MD.  I have reviewed the above documentation for accuracy and completeness, and I agree with the above.

## 2021-08-07 NOTE — Assessment & Plan Note (Signed)
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Neoadjuvant antiestrogen therapy with letrozole 2.5 mg dailystarted 04/26/2018-05/09/2019 2.followed byleftmastectomy: 05/09/2019: Foci of residual IDC grade 1 with extracellular mucin largest focus 1.4 cm margins negative, separate focus of DCIS intermediate grade involving a complex sclerosing lesion, ER 100%, PR 0%, Ki-67 5%, HER-2 1+ negative, 0/1 sentinel lymph node negative 3.Followed by antiestrogen therapy x7 yearswith letrozole ----------------------------------------------------------------------------- Hospitalization June 2020: Multiple strokes, rehabilitation, patient is blind and requires wheelchair. Her husband and daughter are worn out taking care of her. They are hoping for some help at home.  Current treatment: Letrozole 2.5 mg daily. Started 04/27/2018 Letrozole toxicities:.  Breast cancer surveillance:  Patient will need a mammogram this year. Breast exam: Benign   Return to clinic in 1 year for follow-up

## 2021-08-08 ENCOUNTER — Other Ambulatory Visit: Payer: Self-pay

## 2021-08-08 ENCOUNTER — Inpatient Hospital Stay: Payer: Medicare Other | Attending: Hematology and Oncology | Admitting: Hematology and Oncology

## 2021-08-08 DIAGNOSIS — Z170 Estrogen receptor positive status [ER+]: Secondary | ICD-10-CM

## 2021-08-08 DIAGNOSIS — C50312 Malignant neoplasm of lower-inner quadrant of left female breast: Secondary | ICD-10-CM

## 2021-08-09 NOTE — Progress Notes (Signed)
Carelink Summary Report / Loop Recorder 

## 2021-08-25 ENCOUNTER — Ambulatory Visit (INDEPENDENT_AMBULATORY_CARE_PROVIDER_SITE_OTHER): Payer: Medicare Other

## 2021-08-25 DIAGNOSIS — I639 Cerebral infarction, unspecified: Secondary | ICD-10-CM | POA: Diagnosis not present

## 2021-08-26 LAB — CUP PACEART REMOTE DEVICE CHECK
Date Time Interrogation Session: 20220920000023
Implantable Pulse Generator Implant Date: 20200903

## 2021-09-01 NOTE — Progress Notes (Signed)
Carelink Summary Report / Loop Recorder 

## 2021-09-29 ENCOUNTER — Ambulatory Visit (INDEPENDENT_AMBULATORY_CARE_PROVIDER_SITE_OTHER): Payer: Medicare Other

## 2021-09-29 DIAGNOSIS — I639 Cerebral infarction, unspecified: Secondary | ICD-10-CM

## 2021-09-29 LAB — CUP PACEART REMOTE DEVICE CHECK
Date Time Interrogation Session: 20221019230529
Implantable Pulse Generator Implant Date: 20200903

## 2021-10-07 NOTE — Progress Notes (Signed)
Carelink Summary Report / Loop Recorder 

## 2021-11-02 LAB — CUP PACEART REMOTE DEVICE CHECK
Date Time Interrogation Session: 20221121230740
Implantable Pulse Generator Implant Date: 20200903

## 2021-11-03 ENCOUNTER — Ambulatory Visit: Payer: Medicare Other

## 2021-11-03 DIAGNOSIS — I639 Cerebral infarction, unspecified: Secondary | ICD-10-CM

## 2021-11-12 NOTE — Progress Notes (Signed)
Carelink Summary Report / Loop Recorder 

## 2021-11-19 ENCOUNTER — Other Ambulatory Visit: Payer: Self-pay | Admitting: Hematology and Oncology

## 2021-12-02 ENCOUNTER — Ambulatory Visit (INDEPENDENT_AMBULATORY_CARE_PROVIDER_SITE_OTHER): Payer: Medicare Other

## 2021-12-02 DIAGNOSIS — I639 Cerebral infarction, unspecified: Secondary | ICD-10-CM

## 2021-12-02 LAB — CUP PACEART REMOTE DEVICE CHECK
Date Time Interrogation Session: 20221224230636
Implantable Pulse Generator Implant Date: 20200903

## 2021-12-12 NOTE — Progress Notes (Signed)
Carelink Summary Report / Loop Recorder 

## 2022-01-12 ENCOUNTER — Ambulatory Visit (INDEPENDENT_AMBULATORY_CARE_PROVIDER_SITE_OTHER): Payer: Medicare Other

## 2022-01-12 DIAGNOSIS — I639 Cerebral infarction, unspecified: Secondary | ICD-10-CM

## 2022-01-13 LAB — CUP PACEART REMOTE DEVICE CHECK
Date Time Interrogation Session: 20230205230255
Implantable Pulse Generator Implant Date: 20200903

## 2022-01-15 NOTE — Progress Notes (Signed)
Carelink Summary Report / Loop Recorder 

## 2022-02-09 ENCOUNTER — Other Ambulatory Visit (HOSPITAL_COMMUNITY): Payer: Self-pay

## 2022-02-09 ENCOUNTER — Telehealth (HOSPITAL_COMMUNITY): Payer: Self-pay

## 2022-02-09 DIAGNOSIS — R1310 Dysphagia, unspecified: Secondary | ICD-10-CM

## 2022-02-09 NOTE — Telephone Encounter (Signed)
Attempted to contact [facility] to schedule a resident for OP MBS - left voicemail with Juliann Pulse. ?

## 2022-02-16 ENCOUNTER — Ambulatory Visit (INDEPENDENT_AMBULATORY_CARE_PROVIDER_SITE_OTHER): Payer: Medicare Other

## 2022-02-16 DIAGNOSIS — I639 Cerebral infarction, unspecified: Secondary | ICD-10-CM | POA: Diagnosis not present

## 2022-02-17 LAB — CUP PACEART REMOTE DEVICE CHECK
Date Time Interrogation Session: 20230312231306
Implantable Pulse Generator Implant Date: 20200903

## 2022-02-18 ENCOUNTER — Ambulatory Visit (HOSPITAL_COMMUNITY)
Admission: RE | Admit: 2022-02-18 | Discharge: 2022-02-18 | Disposition: A | Discharge status: Home / Self Care | Payer: Medicare Other | Source: Ambulatory Visit | Attending: Adult Health | Admitting: Adult Health

## 2022-02-18 ENCOUNTER — Other Ambulatory Visit: Payer: Self-pay

## 2022-02-18 DIAGNOSIS — R1310 Dysphagia, unspecified: Secondary | ICD-10-CM | POA: Insufficient documentation

## 2022-02-18 NOTE — Evaluation (Cosign Needed)
Objective Swallowing Evaluation: Type of Study: MBS-Modified Barium Swallow Study ?  ?Patient Details  ?Name: Jessica Harrell ?MRN: [id number] ?Date of Birth: 05-Dec-1941 ? ?Today's Date: 02/18/2022 ?Time: SLP Start Time (ACUTE ONLY): 1638 ?-SLP Stop Time (ACUTE ONLY): 1200 ? ?SLP Time Calculation (min) (ACUTE ONLY): 25 min ? ? ?Past Medical History:  ?Past Medical History:  ?Diagnosis Date  ? Breast cancer ([place])   ? Right foot pain Nov. 2014  ? Stroke [facility])   ? ?Past Surgical History:  ?Past Surgical History:  ?Procedure Laterality Date  ? BREAST BIOPSY    ? BUBBLE STUDY  05/18/2019  ? Procedure: BUBBLE STUDY;  Surgeon: Elouise Munroe, MD;  Location: [place];  Service: Cardiology;;  ? CATARACT EXTRACTION    ? both eyes  ? EYE SURGERY Left   ? MASTECTOMY W/ SENTINEL NODE BIOPSY Left 05/09/2019  ? Procedure: LEFT MASTECTOMY WITH LEFT AXILLARY SENTINEL LYMPH NODE BIOPSY;  Surgeon: Coralie Keens, MD;  Location: [place];  Service: General;  Laterality: Left;  ? TEE WITHOUT CARDIOVERSION N/A 05/18/2019  ? Procedure: TRANSESOPHAGEAL ECHOCARDIOGRAM (TEE);  Surgeon: Elouise Munroe, MD;  Location: [place];  Service: Cardiology;  Laterality: N/A;  ? ?HPI: Pt arrives for an OP MBS from [facility] where she is a new resident since 2/23. 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 She did have MBS at that time and was discharged on purees and honey thick liquids. Pt would not consume modified textures and daughter and family assisted pt with careful methods and pt was eventually able to tolerate thin liquids and soft cut up foods. However, around December 2022 pt was observed to have a significant change in function. Her speech and swallowing changed, pt had thick oral secretions, mouth hung open and pt coughed and choked on solids and liquids. Dtr reports 4 lb weight loss since that time, but no pna, no UTI. Despite coughing  severely pt reports enjoying food. Pt has been evaluated by SLP at facility and work up is pending. Daughters goal is for pt to improve and eat and drink more easily. There is no recent documentation in this chart regarding medical change. ?  ?No data recorded ? ? Recommendations for follow up therapy are one component of a multi-disciplinary discharge planning process, led by the attending physician.  Recommendations may be updated based on patient status, additional functional criteria and insurance authorization. ? ?Assessment / Plan / Recommendation ? ?Clinical Impressions 02/18/2022  ?Clinical Impression Pt seen for outpatient MBS. Pt demonstrates a severe oropharyngeal dysphagia with high risk of aspiration regardless of texture modification. Oropharyngeal dysphagia characterized by premature anterior spillage, reduced bolus cohesion and tongue to palate contact, delayed swallow initiation, incomplete epiglottic closure and insufficient laryngeal vestibule closure. Patient has profound incomplete base of tongue weakness and reduced retraction/contact to pharyngeal wall. BOT weakness and incomplete palate contact results in premature spillage and significant aspiration events. Pt has oral/lingual residue and residuals in the valleculae, pyriform sinuses and posterior pharynx. More significant thick secretions in oral cavity, requiring intermittent oral suction during study. Penetration and aspiration occured across all consistences and textures via tsp. Aspiration of thin liquids severe, whereas NTL and HTL aspiration characterized as moderate. Puree textures resulted in trace penetration/aspiration. Pt sensed penetrates/aspirates and made attempts to expel aspriates, however pt could not generate enough negative pressure with cough/throat clear to epxel aspirate. SLP facilitated chin tuck via tsp sips/bites  of NTL, HTL and applesauce. Amount aspirated with chin tuck was slightly reduced. Chin tuck also aided in  propulsion of bolus and pharyngeal peristalsis. Pt benfits from teaspoon administration of NTL and HTL. The prognosis for the pt to meet nutritional needs by mouth without aspiration is guarded. SLP discussed results of MBS with daughter. Discussed possible f/u with neurology to determine acute neurological changes leading to sudden onset of oropharyngeal dysphagia and dysarthria.  ?SLP Visit Diagnosis Dysphagia, oropharyngeal phase (R13.12)  ?Attention and concentration deficit following --  ?Frontal lobe and executive function deficit following --  ?Impact on safety and function Severe aspiration risk  ? ?   ?Treatment Recommendations 02/18/2022  ?Treatment Recommendations Therapy as outlined in treatment plan below  ?   ?Prognosis 02/18/2022  ?Prognosis for Safe Diet Advancement Guarded  ?Barriers to Reach Goals Cognitive deficits  ?Barriers/Prognosis Comment --  ? ? ?Diet Recommendations 02/18/2022  ?SLP Diet Recommendations Dysphagia 1 (Puree) solids;Nectar thick liquid  ?Liquid Administration via Spoon  ?Medication Administration Crushed with puree  ?Compensations Minimize environmental distractions;Slow rate;Small sips/bites;Clear throat intermittently;Chin tuck  ?Postural Changes Seated upright at 90 degrees  ?   ? ?Other Recommendations 02/18/2022  ?Recommended Consults --  ?Oral Care Recommendations Oral care QID  ?Other Recommendations --  ?Follow Up Recommendations Skilled nursing-short term rehab (<3 hours/day)  ?Assistance recommended at discharge Frequent or constant Supervision/Assistance  ?Functional Status Assessment Patient has had a recent decline in their functional status and demonstrates the ability to make significant improvements in function in a reasonable and predictable amount of time.  ? ? ?Frequency and Duration  02/18/2022  ?Speech Therapy Frequency (ACUTE ONLY) min 2x/week  ?Treatment Duration 2 weeks  ?   ? ?Oral Phase 02/18/2022  ?Oral Phase Impaired  ?Oral - Pudding Teaspoon --  ?Oral -  Pudding Cup --  ?Oral - Honey Teaspoon Incomplete tongue to palate contact;Lingual/palatal residue;Decreased bolus cohesion;Premature spillage;Weak lingual manipulation  ?Oral - Honey Cup NT  ?Oral - Nectar Teaspoon Incomplete tongue to palate contact;Lingual/palatal residue;Decreased bolus cohesion;Premature spillage;Weak lingual manipulation  ?Oral - Nectar Cup NT  ?Oral - Nectar Straw NT  ?Oral - Thin Teaspoon Incomplete tongue to palate contact;Lingual/palatal residue;Decreased bolus cohesion;Premature spillage;Weak lingual manipulation  ?Oral - Thin Cup --  ?Oral - Thin Straw NT  ?Oral - Puree Incomplete tongue to palate contact;Lingual/palatal residue;Decreased bolus cohesion;Premature spillage;Weak lingual manipulation  ?Oral - Mech Soft NT  ?Oral - Regular NT  ?Oral - Multi-Consistency NT  ?Oral - Pill NT  ?Oral Phase - Comment --  ?  ?Pharyngeal Phase 02/18/2022  ?Pharyngeal Phase Impaired  ?Pharyngeal- Pudding Teaspoon --  ?Pharyngeal --  ?Pharyngeal- Pudding Cup --  ?Pharyngeal --  ?Pharyngeal- Honey Teaspoon Delayed swallow initiation-pyriform sinuses;Reduced epiglottic inversion;Reduced airway/laryngeal closure;Reduced tongue base retraction;Penetration/Aspiration before swallow;Penetration/Aspiration during swallow;Moderate aspiration;Trace aspiration;Pharyngeal residue - valleculae;Pharyngeal residue - pyriform;Pharyngeal residue - posterior pharnyx  ?Pharyngeal Material enters airway, passes BELOW cords and not ejected out despite cough attempt by patient  ?Pharyngeal- Honey Cup NT  ?Pharyngeal --  ?Pharyngeal- Nectar Teaspoon Delayed swallow initiation-pyriform sinuses;Reduced epiglottic inversion;Reduced airway/laryngeal closure;Reduced tongue base retraction;Penetration/Aspiration before swallow;Penetration/Aspiration during swallow;Moderate aspiration;Trace aspiration;Pharyngeal residue - valleculae;Pharyngeal residue - pyriform;Pharyngeal residue - posterior pharnyx  ?Pharyngeal Material enters  airway, passes BELOW cords and not ejected out despite cough attempt by patient  ?Pharyngeal- Nectar Cup NT  ?Pharyngeal --  ?Pharyngeal- Nectar Straw NT  ?Pharyngeal --  ?Pharyngeal- Thin Teaspoon [place]

## 2022-03-02 NOTE — Progress Notes (Signed)
Carelink Summary Report / Loop Recorder 

## 2022-03-23 ENCOUNTER — Ambulatory Visit (INDEPENDENT_AMBULATORY_CARE_PROVIDER_SITE_OTHER): Payer: Medicare Other

## 2022-03-23 DIAGNOSIS — I639 Cerebral infarction, unspecified: Secondary | ICD-10-CM | POA: Diagnosis not present

## 2022-03-24 LAB — CUP PACEART REMOTE DEVICE CHECK
Date Time Interrogation Session: 20230414230852
Implantable Pulse Generator Implant Date: 20200903

## 2022-04-09 ENCOUNTER — Inpatient Hospital Stay (HOSPITAL_COMMUNITY)
Admission: EM | Admit: 2022-04-09 | Discharge: 2022-04-14 | DRG: 177 | Disposition: A | Discharge status: Hospice | Payer: Medicare Other | Attending: Internal Medicine | Admitting: Internal Medicine

## 2022-04-09 ENCOUNTER — Encounter (HOSPITAL_COMMUNITY): Payer: Self-pay

## 2022-04-09 ENCOUNTER — Other Ambulatory Visit: Payer: Self-pay

## 2022-04-09 ENCOUNTER — Emergency Department (HOSPITAL_COMMUNITY): Payer: Medicare Other

## 2022-04-09 DIAGNOSIS — Z87891 Personal history of nicotine dependence: Secondary | ICD-10-CM

## 2022-04-09 DIAGNOSIS — Z7982 Long term (current) use of aspirin: Secondary | ICD-10-CM | POA: Diagnosis not present

## 2022-04-09 DIAGNOSIS — A419 Sepsis, unspecified organism: Secondary | ICD-10-CM

## 2022-04-09 DIAGNOSIS — Z20822 Contact with and (suspected) exposure to covid-19: Secondary | ICD-10-CM | POA: Diagnosis present

## 2022-04-09 DIAGNOSIS — J9601 Acute respiratory failure with hypoxia: Secondary | ICD-10-CM | POA: Diagnosis present

## 2022-04-09 DIAGNOSIS — Z79899 Other long term (current) drug therapy: Secondary | ICD-10-CM

## 2022-04-09 DIAGNOSIS — J189 Pneumonia, unspecified organism: Secondary | ICD-10-CM | POA: Diagnosis not present

## 2022-04-09 DIAGNOSIS — R0902 Hypoxemia: Principal | ICD-10-CM

## 2022-04-09 DIAGNOSIS — Z8249 Family history of ischemic heart disease and other diseases of the circulatory system: Secondary | ICD-10-CM

## 2022-04-09 DIAGNOSIS — Z853 Personal history of malignant neoplasm of breast: Secondary | ICD-10-CM | POA: Diagnosis not present

## 2022-04-09 DIAGNOSIS — Z807 Family history of other malignant neoplasms of lymphoid, hematopoietic and related tissues: Secondary | ICD-10-CM | POA: Diagnosis not present

## 2022-04-09 DIAGNOSIS — Z66 Do not resuscitate: Secondary | ICD-10-CM | POA: Diagnosis present

## 2022-04-09 DIAGNOSIS — Z79811 Long term (current) use of aromatase inhibitors: Secondary | ICD-10-CM

## 2022-04-09 DIAGNOSIS — E785 Hyperlipidemia, unspecified: Secondary | ICD-10-CM | POA: Diagnosis present

## 2022-04-09 DIAGNOSIS — Z8673 Personal history of transient ischemic attack (TIA), and cerebral infarction without residual deficits: Secondary | ICD-10-CM

## 2022-04-09 DIAGNOSIS — J439 Emphysema, unspecified: Secondary | ICD-10-CM | POA: Diagnosis present

## 2022-04-09 DIAGNOSIS — I10 Essential (primary) hypertension: Secondary | ICD-10-CM | POA: Diagnosis present

## 2022-04-09 DIAGNOSIS — Z515 Encounter for palliative care: Secondary | ICD-10-CM | POA: Diagnosis not present

## 2022-04-09 DIAGNOSIS — D72828 Other elevated white blood cell count: Secondary | ICD-10-CM | POA: Diagnosis present

## 2022-04-09 DIAGNOSIS — J690 Pneumonitis due to inhalation of food and vomit: Secondary | ICD-10-CM | POA: Diagnosis present

## 2022-04-09 LAB — PROTIME-INR
INR: 1.1 (ref 0.8–1.2)
Prothrombin Time: 13.8 seconds (ref 11.4–15.2)

## 2022-04-09 LAB — BLOOD GAS, VENOUS
Acid-Base Excess: 4.3 mmol/L — ABNORMAL HIGH (ref 0.0–2.0)
Bicarbonate: 29.2 mmol/L — ABNORMAL HIGH (ref 20.0–28.0)
O2 Saturation: 82.1 %
Patient temperature: 37
pCO2, Ven: 44 mmHg (ref 44–60)
pH, Ven: 7.43 (ref 7.25–7.43)
pO2, Ven: 46 mmHg — ABNORMAL HIGH (ref 32–45)

## 2022-04-09 LAB — CBC WITH DIFFERENTIAL/PLATELET
Abs Immature Granulocytes: 0.04 10*3/uL (ref 0.00–0.07)
Basophils Absolute: 0 10*3/uL (ref 0.0–0.1)
Basophils Relative: 0 %
Eosinophils Absolute: 0 10*3/uL (ref 0.0–0.5)
Eosinophils Relative: 0 %
HCT: 38.2 % (ref 36.0–46.0)
Hemoglobin: 11.8 g/dL — ABNORMAL LOW (ref 12.0–15.0)
Immature Granulocytes: 0 %
Lymphocytes Relative: 5 %
Lymphs Abs: 0.7 10*3/uL (ref 0.7–4.0)
MCH: 30.7 pg (ref 26.0–34.0)
MCHC: 30.9 g/dL (ref 30.0–36.0)
MCV: 99.5 fL (ref 80.0–100.0)
Monocytes Absolute: 0.6 10*3/uL (ref 0.1–1.0)
Monocytes Relative: 4 %
Neutro Abs: 12.1 10*3/uL — ABNORMAL HIGH (ref 1.7–7.7)
Neutrophils Relative %: 91 %
Platelets: 336 10*3/uL (ref 150–400)
RBC: 3.84 MIL/uL — ABNORMAL LOW (ref 3.87–5.11)
RDW: 13 % (ref 11.5–15.5)
WBC: 13.4 10*3/uL — ABNORMAL HIGH (ref 4.0–10.5)
nRBC: 0 % (ref 0.0–0.2)

## 2022-04-09 LAB — BASIC METABOLIC PANEL
Anion gap: 9 (ref 5–15)
BUN: 25 mg/dL — ABNORMAL HIGH (ref 8–23)
CO2: 24 mmol/L (ref 22–32)
Calcium: 8.5 mg/dL — ABNORMAL LOW (ref 8.9–10.3)
Chloride: 112 mmol/L — ABNORMAL HIGH (ref 98–111)
Creatinine, Ser: 0.81 mg/dL (ref 0.44–1.00)
GFR, Estimated: >60 mL/min (ref >60)
Glucose, Bld: 127 mg/dL — ABNORMAL HIGH (ref 70–99)
Potassium: 3.6 mmol/L (ref 3.5–5.1)
Sodium: 145 mmol/L (ref 135–145)

## 2022-04-09 LAB — TROPONIN I (HIGH SENSITIVITY)
Troponin I (High Sensitivity): 5 ng/L (ref <18)
Troponin I (High Sensitivity): 5 ng/L (ref <18)

## 2022-04-09 LAB — LACTIC ACID, PLASMA: Lactic Acid, Venous: 1.9 mmol/L (ref 0.5–1.9)

## 2022-04-09 LAB — RESP PANEL BY RT-PCR (FLU A&B, COVID) ARPGX2
Influenza A by PCR: NEGATIVE
Influenza B by PCR: NEGATIVE
SARS Coronavirus 2 by RT PCR: NEGATIVE

## 2022-04-09 LAB — BRAIN NATRIURETIC PEPTIDE: B Natriuretic Peptide: 110.6 pg/mL — ABNORMAL HIGH (ref 0.0–100.0)

## 2022-04-09 LAB — APTT: aPTT: 26 seconds (ref 24–36)

## 2022-04-09 LAB — MRSA NEXT GEN BY PCR, NASAL: MRSA by PCR Next Gen: NOT DETECTED

## 2022-04-09 LAB — D-DIMER, QUANTITATIVE: D-Dimer, Quant: 3.77 ug/mL-FEU — ABNORMAL HIGH (ref 0.00–0.50)

## 2022-04-09 MED ORDER — AMLODIPINE BESYLATE 5 MG PO TABS: 2.5000 mg | ORAL_TABLET | Freq: Every day | ORAL | Status: DC

## 2022-04-09 MED ORDER — SODIUM CHLORIDE 0.9 % IV SOLN
500.0000 mg | INTRAVENOUS | Status: DC
  Administered 2022-04-09 – 2022-04-11 (×3): 500 mg via INTRAVENOUS
  Filled 2022-04-09 (×3): qty 5

## 2022-04-09 MED ORDER — ACETAMINOPHEN 325 MG PO TABS: 650.0000 mg | ORAL_TABLET | Freq: Four times a day (QID) | ORAL | Status: DC | PRN

## 2022-04-09 MED ORDER — DEXTROSE-NACL 5-0.45 % IV SOLN: INTRAVENOUS | Status: AC

## 2022-04-09 MED ORDER — SODIUM CHLORIDE (PF) 0.9 % IJ SOLN
INTRAMUSCULAR | Status: AC
Start: 2022-04-09 — End: 2022-04-10
  Filled 2022-04-09: qty 50

## 2022-04-09 MED ORDER — ORAL CARE MOUTH RINSE
15.0000 mL | Freq: Two times a day (BID) | OROMUCOSAL | Status: DC
  Administered 2022-04-10 – 2022-04-12 (×5): 15 mL via OROMUCOSAL

## 2022-04-09 MED ORDER — CHLORHEXIDINE GLUCONATE CLOTH 2 % EX PADS
6.0000 | MEDICATED_PAD | Freq: Every day | CUTANEOUS | Status: DC
  Administered 2022-04-09 – 2022-04-12 (×4): 6 via TOPICAL

## 2022-04-09 MED ORDER — LACTATED RINGERS IV SOLN: INTRAVENOUS | Status: DC

## 2022-04-09 MED ORDER — SODIUM CHLORIDE 0.9 % IV SOLN
2.0000 g | INTRAVENOUS | Status: DC
  Administered 2022-04-09 – 2022-04-11 (×3): 2 g via INTRAVENOUS
  Filled 2022-04-09 (×4): qty 20

## 2022-04-09 MED ORDER — IOHEXOL 350 MG/ML SOLN
100.0000 mL | Freq: Once | INTRAVENOUS | Status: AC | PRN
  Administered 2022-04-09: 75 mL via INTRAVENOUS

## 2022-04-09 MED ORDER — LACTATED RINGERS IV BOLUS (SEPSIS)
1000.0000 mL | Freq: Once | INTRAVENOUS | Status: AC
  Administered 2022-04-09: 1000 mL via INTRAVENOUS

## 2022-04-09 MED ORDER — LETROZOLE 2.5 MG PO TABS: 2.5000 mg | ORAL_TABLET | Freq: Every day | ORAL | Status: DC

## 2022-04-09 MED ORDER — ASPIRIN EC 81 MG PO TBEC
81.0000 mg | DELAYED_RELEASE_TABLET | Freq: Every day | ORAL | Status: DC
Start: 2022-04-10 — End: 2022-04-12

## 2022-04-09 MED ORDER — ROSUVASTATIN CALCIUM 10 MG PO TABS: 10.0000 mg | ORAL_TABLET | Freq: Every day | ORAL | Status: DC

## 2022-04-09 MED ORDER — SODIUM CHLORIDE 0.9 % IV SOLN: 2.0000 g | INTRAVENOUS | Status: DC

## 2022-04-09 MED ORDER — ENOXAPARIN SODIUM 40 MG/0.4ML IJ SOSY
40.0000 mg | PREFILLED_SYRINGE | Freq: Every day | INTRAMUSCULAR | Status: DC
  Administered 2022-04-09 – 2022-04-11 (×3): 40 mg via SUBCUTANEOUS
  Filled 2022-04-09 (×3): qty 0.4

## 2022-04-09 MED ORDER — SODIUM CHLORIDE 0.9 % IV SOLN: 500.0000 mg | INTRAVENOUS | Status: DC

## 2022-04-09 NOTE — ED Provider Notes (Signed)
?  Physical Exam  ?BP 127/75   Pulse 100   Temp 98 ?F (36.7 ?C) (Oral)   Resp (!) 21   Ht '5\' 7"'$  (1.702 m)   Wt 45.4 kg   SpO2 98%   BMI 15.66 kg/m?  ? ?Physical Exam ? ?Procedures  ?Procedures ? ?ED Course / MDM  ?  ?Medical Decision Making ?Care assumed at 3 PM.  Patient is here with shortness of breath and cough.  Has elevated D-dimer and CTA pending.  Patient has new oxygen requirement and antibiotics already ordered ? ?5 PM ?CT showed pneumonia and no PE.  Hospitalist to admit for hypoxia from pneumonia.  I updated patient and family. ? ?Problems Addressed: ?Community acquired pneumonia, unspecified laterality: acute illness or injury ?Hypoxia: acute illness or injury ?Sepsis, due to unspecified organism, unspecified whether acute organ dysfunction present [facility]): acute illness or injury ? ?Amount and/or Complexity of Data Reviewed ?Labs: ordered. Decision-making details documented in ED Course. ?Radiology: ordered and independent interpretation performed. Decision-making details documented in ED Course. ? ?Risk ?Prescription drug management. ?Decision regarding hospitalization. ? ? ? ? ? ? ?  ?Drenda Freeze, MD ?04/09/22 1740 ? ?

## 2022-04-09 NOTE — ED Provider Notes (Addendum)
?[facility] DEPT ?Provider Note ? ? ?CSN: [id number] ?Arrival date & time: 04/09/22  1146 ? ?  ? ?History ? ?Chief Complaint  ?Patient presents with  ? Shortness of Breath  ? ? ?Jessica Harrell is a 81 y.o. female. ? ? Patient as above with significant medical history as below, including breast cancer, prior stroke, nursing home resident who presents to the ED with complaint of dyspnea, coughing, ? ?Patient is unable to provide significant history but she does report feeling short of breath, coughing.  Denies fever but does report some chills. ? ?Level 5 caveat, ams/cva ? ?Per EMS pt at her approximate baseline ? ?Past Medical History: ?No date: Breast cancer ([place]) ?Nov. 2014: Right foot pain ?No date: Stroke [facility]) ? ?Past Surgical History: ?No date: BREAST BIOPSY ?05/18/2019: BUBBLE STUDY ?    Comment:  Procedure: BUBBLE STUDY;  Surgeon: Elouise Munroe,  ?             MD;  Location: [place];  Service: Cardiology;; ?No date: CATARACT EXTRACTION ?    Comment:  both eyes ?No date: EYE SURGERY; Left ?05/09/2019: MASTECTOMY W/ SENTINEL NODE BIOPSY; Left ?    Comment:  Procedure: LEFT MASTECTOMY WITH LEFT AXILLARY SENTINEL  ?             LYMPH NODE BIOPSY;  Surgeon: Coralie Keens, MD;   ?             Location: [place];  Service: General;  Laterality: Left; ?05/18/2019: TEE WITHOUT CARDIOVERSION; N/A ?    Comment:  Procedure: TRANSESOPHAGEAL ECHOCARDIOGRAM (TEE);   ?             Surgeon: Elouise Munroe, MD;  Location: [place]; ?             Service: Cardiology;  Laterality: N/A;  ? ? ?The history is provided by the patient and the EMS personnel. No language interpreter was used.  ?Shortness of Breath ?Associated symptoms: cough   ? ?  ? ?Home Medications ?Prior to Admission medications   ?Medication Sig Start Date End Date Taking? Authorizing Provider  ?acetaminophen (TYLENOL) 325 MG tablet Take 650 mg by mouth every 6 (six) hours as needed for mild pain.   Yes [provider]  ?amLODipine (NORVASC) 2.5 MG tablet Take 2.5 mg by mouth daily. 04/07/21  Yes [provider]  ?aspirin EC 81 MG tablet Take 1 tablet (81 mg total) by mouth daily. 08/08/20  Yes Nicholas Lose, MD  ?mirtazapine (REMERON) 15 MG tablet Take 1 tablet (15 mg total) by mouth at bedtime. ?Patient taking differently: Take 7.5 mg by mouth at bedtime. 08/09/19  Yes Nicholas Lose, MD  ?rosuvastatin (CRESTOR) 10 MG tablet Take 10 mg by mouth daily.   Yes [provider]  ?letrozole (FEMARA) 2.5 MG tablet TAKE 1 TABLET ONCE DAILY. ?Patient not taking: Reported on 04/09/2022 11/19/21   Nicholas Lose, MD  ?   ? ?Allergies    ?Atorvastatin   ? ?Review of Systems   ?Review of Systems  ?Unable to perform ROS: Mental status change  ?Constitutional:  Positive for chills.  ?Respiratory:  Positive for cough and shortness of breath.   ? ?Physical Exam ?Updated Vital Signs ?BP 127/75   Pulse 100   Temp 98 ?F (36.7 ?C) (Oral)   Resp (!) 21   Ht '5\' 7"'$  (1.702 m)   Wt 45.4 kg   SpO2 98%   BMI 15.66 kg/m?  ?  Physical Exam ?Vitals and nursing note reviewed.  ?Constitutional:   ?   General: She is in acute distress.  ?   Appearance: Normal appearance.  ?HENT:  ?   Head: Normocephalic and atraumatic.  ?   Right Ear: External ear normal.  ?   Left Ear: External ear normal.  ?   Nose: Nose normal.  ?   Mouth/Throat:  ?   Mouth: Mucous membranes are moist.  ?Eyes:  ?   General: No scleral icterus.    ?   Right eye: No discharge.     ?   Left eye: No discharge.  ?Cardiovascular:  ?   Rate and Rhythm: Normal rate and regular rhythm.  ?   Pulses: Normal pulses.  ?   Heart sounds: Normal heart sounds.  ?Pulmonary:  ?   Effort: Pulmonary effort is normal. Tachypnea present. No respiratory distress.  ?   Breath sounds: Decreased breath sounds and wheezing present.  ?Abdominal:  ?   General: Abdomen is flat.  ?   Tenderness: There is no abdominal tenderness.  ?Musculoskeletal:     ?   General: Normal range of motion.  ?    Cervical back: Normal range of motion.  ?   Right lower leg: No edema.  ?   Left lower leg: No edema.  ?Skin: ?   General: Skin is warm and dry.  ?   Capillary Refill: Capillary refill takes less than 2 seconds.  ?Neurological:  ?   Mental Status: She is alert.  ?Psychiatric:     ?   Mood and Affect: Mood normal.     ?   Behavior: Behavior normal.  ? ? ?ED Results / Procedures / Treatments   ?Labs ?(all labs ordered are listed, but only abnormal results are displayed) ?Labs Reviewed  ?BASIC METABOLIC PANEL - Abnormal; Notable for the following components:  ?    Result Value  ? Chloride 112 (*)   ? Glucose, Bld 127 (*)   ? BUN 25 (*)   ? Calcium 8.5 (*)   ? All other components within normal limits  ?BRAIN NATRIURETIC PEPTIDE - Abnormal; Notable for the following components:  ? B Natriuretic Peptide 110.6 (*)   ? All other components within normal limits  ?D-DIMER, QUANTITATIVE - Abnormal; Notable for the following components:  ? D-Dimer, Quant 3.77 (*)   ? All other components within normal limits  ?CBC WITH DIFFERENTIAL/PLATELET - Abnormal; Notable for the following components:  ? WBC 13.4 (*)   ? RBC 3.84 (*)   ? Hemoglobin 11.8 (*)   ? Neutro Abs 12.1 (*)   ? All other components within normal limits  ?BLOOD GAS, VENOUS - Abnormal; Notable for the following components:  ? pO2, Ven 46 (*)   ? Bicarbonate 29.2 (*)   ? Acid-Base Excess 4.3 (*)   ? All other components within normal limits  ?RESP PANEL BY RT-PCR (FLU A&B, COVID) [id number]  ?CULTURE, BLOOD (ROUTINE X 2)  ?CULTURE, BLOOD (ROUTINE X 2)  ?LACTIC ACID, PLASMA  ?PROTIME-INR  ?APTT  ?LACTIC ACID, PLASMA  ?URINALYSIS, ROUTINE W REFLEX MICROSCOPIC  ?TROPONIN I (HIGH SENSITIVITY)  ?TROPONIN I (HIGH SENSITIVITY)  ? ? ?EKG ?None ? ?Radiology ?DG Chest 2 View ? ?Result Date: 04/09/2022 ?CLINICAL DATA:  Shortness of breath and bradycardia. Poor oxygen saturation. EXAM: CHEST - 2 VIEW COMPARISON:  05/15/2019 FINDINGS: Heart size is normal. Loop recorder in place. The  right lung is clear except for chronic scarring  at the right base. There is left lower lobe pneumonia. Small amount of pleural fluid on the left. No acute bone finding. IMPRESSION: Left lower lobe pneumonia. Electronically Signed   By: Nelson Chimes M.D.   On: 04/09/2022 13:43  ? ?CT Head Wo Contrast ? ?Result Date: 04/09/2022 ?CLINICAL DATA:  Delirium.  Mental status changes. EXAM: CT HEAD WITHOUT CONTRAST TECHNIQUE: Contiguous axial images were obtained from the base of the skull through the vertex without intravenous contrast. RADIATION DOSE REDUCTION: This exam was performed according to the departmental dose-optimization program which includes automated exposure control, adjustment of the mA and/or kV according to patient size and/or use of iterative reconstruction technique. COMPARISON:  07/07/2021 FINDINGS: Brain: Chronic and extensive cerebellar infarctions left more than right. Old occipital infarctions left more extensive than right. Old right frontal cortical and subcortical infarction. Chronic small-vessel ischemic changes of the basal ganglia, thalami and hemispheric white matter. No sign of acute infarction, mass lesion, hemorrhage, hydrocephalus or extra-axial collection. Vascular: There is atherosclerotic calcification of the major vessels at the base of the brain. Skull: Negative Sinuses/Orbits: Clear/normal Other: None IMPRESSION: No acute CT finding. Extensive old infarctions affecting the cerebellum, occipital lobes and right frontal lobe. Chronic small vessel insults of the basal ganglia, thalami and hemispheric white matter. Electronically Signed   By: Nelson Chimes M.D.   On: 04/09/2022 15:42  ? ?CT Angio Chest PE W and/or Wo Contrast ? ?Result Date: 04/09/2022 ?CLINICAL DATA:  Pulmonary embolism suspected.  Positive D-dimer. EXAM: CT ANGIOGRAPHY CHEST WITH CONTRAST TECHNIQUE: Multidetector CT imaging of the chest was performed using the standard protocol during bolus administration of intravenous  contrast. Multiplanar CT image reconstructions and MIPs were obtained to evaluate the vascular anatomy. RADIATION DOSE REDUCTION: This exam was performed according to the departmental dose-optimization program which

## 2022-04-09 NOTE — ED Triage Notes (Signed)
[facility] reports pt coming from [facility] w/complaints of sob and low pulse ox. Upon EMS arrival pt RA pulse ox 82%. On 4L [place] came up to 86%. One duo neb was given and pt came up to 92%. Previous history of CVA with cognitive deficits. Pt does PUREE diet due to aspiration risk. Per EMS and staff pt is at baseline. ?

## 2022-04-09 NOTE — ED Notes (Signed)
ED TO INPATIENT HANDOFF REPORT ? ?ED Nurse Name and Phone #: Baxter Flattery, RN ? ?S ?Name/Age/Gender ?Jessica Harrell ?81 y.o. ?female ?Room/Bed: OY77/AJ28 ? ?Code Status ?  Code Status: Prior ? ?Home/SNF/Other ?Skilled nursing facility ?Patient oriented to: self ?Is this baseline? Yes  ? ?Triage Complete: Triage complete  ?Chief Complaint ?Multifocal pneumonia [J18.9] ? ?Triage Note ?[facility] reports pt coming from [facility] w/complaints of sob and low pulse ox. Upon EMS arrival pt RA pulse ox 82%. On 4L [place] came up to 86%. One duo neb was given and pt came up to 92%. Previous history of CVA with cognitive deficits. Pt does PUREE diet due to aspiration risk. Per EMS and staff pt is at baseline.  ? ?Allergies ?Allergies  ?Allergen Reactions  ? Atorvastatin Other (See Comments)  ?  Muscle pain   ? ? ?Level of Care/Admitting Diagnosis ?ED Disposition   ? ? ED Disposition  ?Admit  ? Condition  ?--  ? Comment  ?Hospital Area: [facility] [[id number]] ? Level of Care: Stepdown [14] ? Admit to SDU based on following criteria: Respiratory Distress:  Frequent assessment and/or intervention to maintain adequate ventilation/respiration, pulmonary toilet, and respiratory treatment. ? May admit patient to [facility] or [facility] if equivalent level of care is available:: No ? Covid Evaluation: Asymptomatic - no recent exposure (last 10 days) testing not required ? Diagnosis: Multifocal pneumonia [2094709] ? Admitting Physician: Charlynne Cousins [[id number]] ? Attending Physician: Aileen Fass, [place] ? Estimated length of stay: past midnight tomorrow ? Certification:: I certify this patient will need inpatient services for at least 2 midnights ?  ?  ? ?  ? ? ?B ?Medical/Surgery History ?Past Medical History:  ?Diagnosis Date  ? Breast cancer ([place])   ? Right foot pain Nov. 2014  ? Stroke [facility])   ? ?Past Surgical History:  ?Procedure Laterality Date  ? BREAST BIOPSY    ? BUBBLE STUDY  05/18/2019  ? Procedure:  BUBBLE STUDY;  Surgeon: Elouise Munroe, MD;  Location: [place];  Service: Cardiology;;  ? CATARACT EXTRACTION    ? both eyes  ? EYE SURGERY Left   ? MASTECTOMY W/ SENTINEL NODE BIOPSY Left 05/09/2019  ? Procedure: LEFT MASTECTOMY WITH LEFT AXILLARY SENTINEL LYMPH NODE BIOPSY;  Surgeon: Coralie Keens, MD;  Location: [place];  Service: General;  Laterality: Left;  ? TEE WITHOUT CARDIOVERSION N/A 05/18/2019  ? Procedure: TRANSESOPHAGEAL ECHOCARDIOGRAM (TEE);  Surgeon: Elouise Munroe, MD;  Location: [place];  Service: Cardiology;  Laterality: N/A;  ?  ? ?A ?IV Location/Drains/Wounds ?Patient Lines/Drains/Airways Status   ? ? Active Line/Drains/Airways   ? ? Name Placement date Placement time Site Days  ? Peripheral IV 04/09/22 20 G 1" Anterior;Left Forearm 04/09/22  --  Forearm  less than 1  ? Closed System Drain 1 Left Chest Bulb (JP) 05/09/19  1008  Chest  1066  ? External Urinary Catheter 04/09/22  1725  --  less than 1  ? Incision (Closed) 05/09/19 Breast Left 05/09/19  0851  -- 1066  ? ?  ?  ? ?  ? ? ?Intake/Output Last 24 hours ? ?Intake/Output Summary (Last 24 hours) at 04/09/2022 1810 ?Last data filed at 04/09/2022 1647 ?Gross per 24 hour  ?Intake 315.6 ml  ?Output --  ?Net 315.6 ml  ? ? ?Labs/Imaging ?Results for orders placed or performed during the hospital encounter of 04/09/22 (from the past 48 hour(s))  ?Basic metabolic panel  Status: Abnormal  ? Collection Time: 04/09/22  1:10 PM  ?Result Value Ref Range  ? Sodium 145 135 - 145 mmol/L  ? Potassium 3.6 3.5 - 5.1 mmol/L  ? Chloride 112 (H) 98 - 111 mmol/L  ? CO2 24 22 - 32 mmol/L  ? Glucose, Bld 127 (H) 70 - 99 mg/dL  ?  Comment: Glucose reference range applies only to samples taken after fasting for at least 8 hours.  ? BUN 25 (H) 8 - 23 mg/dL  ? Creatinine, Ser 0.81 0.44 - 1.00 mg/dL  ? Calcium 8.5 (L) 8.9 - 10.3 mg/dL  ? GFR, Estimated >60 >60 mL/min  ?  Comment: (NOTE) ?Calculated using the CKD-EPI Creatinine Equation (2021) ?  ? Anion  gap 9 5 - 15  ?  Comment: Performed at [facility], [place] [address], [place], [place] [address]  ?Brain natriuretic peptide     Status: Abnormal  ? Collection Time: 04/09/22  1:10 PM  ?Result Value Ref Range  ? B Natriuretic Peptide 110.6 (H) 0.0 - 100.0 pg/mL  ?  Comment: Performed at [facility], [place] [address], [place], [place] [address]  ?D-dimer, quantitative     Status: Abnormal  ? Collection Time: 04/09/22  1:10 PM  ?Result Value Ref Range  ? D-Dimer, Quant 3.77 (H) 0.00 - 0.50 ug/mL-FEU  ?  Comment: (NOTE) ?At the manufacturer cut-off value of 0.5 ?g/mL FEU, this assay has a ?negative predictive value of 95-100%.This assay is intended for use ?in conjunction with a clinical pretest probability (PTP) assessment ?model to exclude pulmonary embolism (PE) and deep venous thrombosis ?(DVT) in outpatients suspected of PE or DVT. ?Results should be correlated with clinical presentation. ?Performed at [facility], [place] [address] ?[place], [place] [address] ?  ?CBC with Differential     Status: Abnormal  ? Collection Time: 04/09/22  1:10 PM  ?Result Value Ref Range  ? WBC 13.4 (H) 4.0 - 10.5 K/uL  ? RBC 3.84 (L) 3.87 - 5.11 MIL/uL  ? Hemoglobin 11.8 (L) 12.0 - 15.0 g/dL  ? HCT 38.2 36.0 - 46.0 %  ? MCV 99.5 80.0 - 100.0 fL  ? MCH 30.7 26.0 - 34.0 pg  ? MCHC 30.9 30.0 - 36.0 g/dL  ? RDW 13.0 11.5 - 15.5 %  ? Platelets 336 150 - 400 K/uL  ? nRBC 0.0 0.0 - 0.2 %  ? Neutrophils Relative % 91 %  ? Neutro Abs 12.1 (H) 1.7 - 7.7 K/uL  ? Lymphocytes Relative 5 %  ? Lymphs Abs 0.7 0.7 - 4.0 K/uL  ? Monocytes Relative 4 %  ? Monocytes Absolute 0.6 0.1 - 1.0 K/uL  ? Eosinophils Relative 0 %  ? Eosinophils Absolute 0.0 0.0 - 0.5 K/uL  ? Basophils Relative 0 %  ? Basophils Absolute 0.0 0.0 - 0.1 K/uL  ? Immature Granulocytes 0 %  ? Abs Immature Granulocytes 0.04 0.00 - 0.07 K/uL  ?  Comment: Performed at [facility], [place] [address] [place],  [place] [address]  ?Troponin I (High Sensitivity)     Status: None  ? Collection Time: 04/09/22  1:10 PM  ?Result Value Ref Range  ? Troponin I (High Sensitivity) 5 <18 ng/L  ?  Comment: (NOTE) ?Elevated high sensitivity troponin I (hsTnI) values and significant  ?changes across serial measurements may suggest ACS but many other  ?chronic and acute conditions are known to elevate hsTnI results.  ?Refer to the "Links"  section for chest pain algorithms and additional  ?guidance. ?Performed at [facility], [place] [address] ?[place], [place] [address] ?  ?Blood gas, venous (at [facility] and [facility], not at [facility])     Status: Abnormal  ? Collection Time: 04/09/22  1:30 PM  ?Result Value Ref Range  ? pH, Ven 7.43 7.25 - 7.43  ? pCO2, Ven 44 44 - 60 mmHg  ? pO2, Ven 46 (H) 32 - 45 mmHg  ? Bicarbonate 29.2 (H) 20.0 - 28.0 mmol/L  ? Acid-Base Excess 4.3 (H) 0.0 - 2.0 mmol/L  ? O2 Saturation 82.1 %  ? Patient temperature 37.0   ?  Comment: Performed at [facility], [place] [address], [place], [place] [address]  ?Resp Panel by RT-PCR (Flu A&B, Covid) Nasopharyngeal Swab     Status: None  ? Collection Time: 04/09/22  2:30 PM  ? Specimen: Nasopharyngeal Swab; Nasopharyngeal(NP) swabs in vial transport medium  ?Result Value Ref Range  ? SARS Coronavirus 2 by RT PCR NEGATIVE NEGATIVE  ?  Comment: (NOTE) ?SARS-CoV-2 target nucleic acids are NOT DETECTED. ? ?The SARS-CoV-2 RNA is generally detectable in upper respiratory ?specimens during the acute phase of infection. The lowest ?concentration of SARS-CoV-2 viral copies this assay can detect is ?138 copies/mL. A negative result does not preclude SARS-Cov-2 ?infection and should not be used as the sole basis for treatment or ?other patient management decisions. A negative result may occur with  ?improper specimen collection/handling, submission of specimen other ?than nasopharyngeal swab, presence of viral mutation(s) within the ?areas targeted by this assay, and  inadequate number of viral ?copies(<138 copies/mL). A negative result must be combined with ?clinical observations, patient history, and epidemiological ?information. The expected result is Negative. ? ?Fact

## 2022-04-09 NOTE — Progress Notes (Signed)
Carelink Summary Report / Loop Recorder 

## 2022-04-09 NOTE — H&P (Signed)
? ? ?History and Physical ? ?Jessica Harrell BOF:[id number] DOB: 05-03-1941 DOA: 04/09/2022 ? ?PCP: [name], MD ?Patient coming from: SNF ? ?I have personally briefly reviewed patient's old medical records in [place] ? ? ?Chief Complaint: Somnolence and hypoxia ? ?HPI: Jessica Harrell is a 81 y.o. female past medical history of left-sided breast cancer, history of CVA, tobacco abuse who was sent by the nursing home for somnolence and hypoxia.  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  Next line ? ?In the ED: ?He was found to be hypoxic on room air at 85%, white count of 13 CT scan showed no acute findings, extensive old infarcts affecting cerebellum occipital and right frontal lobe, with chronic small vessel insults of the basal ganglia thalami and hemispheric white matter. ?CT angio of the chest showed left lower lobe pneumonia with consolidation and collapse and small left-sided pleural effusion and lesser patchy bronchopneumonia on the right. ? ? ?Review of Systems: All systems reviewed and apart from history of presenting illness, are negative. ? ?Past Medical History:  ?Diagnosis Date  ? Breast cancer ([place])   ? Right foot pain Nov. 2014  ? Stroke [facility])   ? ?Past Surgical History:  ?Procedure Laterality Date  ? BREAST BIOPSY    ? BUBBLE STUDY  05/18/2019  ? Procedure: BUBBLE STUDY;  Surgeon: Elouise Munroe, MD;  Location: [place];  Service: Cardiology;;  ? CATARACT EXTRACTION    ? both eyes  ? EYE SURGERY Left   ? MASTECTOMY W/ SENTINEL NODE BIOPSY Left 05/09/2019  ? Procedure: LEFT MASTECTOMY WITH LEFT AXILLARY SENTINEL LYMPH NODE BIOPSY;  Surgeon: Coralie Keens, MD;  Location: [place];  Service: General;  Laterality: Left;  ? TEE WITHOUT CARDIOVERSION N/A 05/18/2019   ? Procedure: TRANSESOPHAGEAL ECHOCARDIOGRAM (TEE);  Surgeon: Elouise Munroe, MD;  Location: [place];  Service: Cardiology;  Laterality: N/A;  ? ?Social History:  reports that she has quit smoking. Her smoking use included cigarettes. She has a 29.00 pack-year smoking history. She has never used smokeless tobacco. She reports that she does not currently use alcohol after a past usage of about 7.0 standard drinks per week. She reports that she does not use drugs. ? ? ?Allergies  ?Allergen Reactions  ? Atorvastatin Other (See Comments)  ?  Muscle pain   ? ? ?Family History  ?Problem Relation Age of Onset  ? Cancer Mother   ?     esophageal  ? Hypertension Mother   ? Varicose Veins Mother   ? Cancer Sister   ?     non hodgkins  ? Cancer Other   ?     unknown  ?   ? ?Prior to Admission medications   ?Medication Sig Start Date End Date Taking? Authorizing Provider  ?acetaminophen (TYLENOL) 325 MG tablet Take 650 mg by mouth every 6 (six) hours as needed for mild pain.   Yes [provider]  ?amLODipine (NORVASC) 2.5 MG tablet Take 2.5 mg by mouth daily. 04/07/21  Yes [provider]  ?aspirin EC 81 MG tablet Take 1 tablet (81 mg total) by mouth daily. 08/08/20  Yes Nicholas Lose, MD  ?mirtazapine (REMERON) 15 MG tablet Take 1 tablet (15 mg total) by mouth at  bedtime. ?Patient taking differently: Take 7.5 mg by mouth at bedtime. 08/09/19  Yes Nicholas Lose, MD  ?rosuvastatin (CRESTOR) 10 MG tablet Take 10 mg by mouth daily.   Yes [provider]  ?letrozole (FEMARA) 2.5 MG tablet TAKE 1 TABLET ONCE DAILY. ?Patient not taking: Reported on 04/09/2022 11/19/21   Nicholas Lose, MD  ? ?Physical Exam: ?Vitals:  ? 04/09/22 1315 04/09/22 1330 04/09/22 1400 04/09/22 1430  ?BP:  108/60 (!) 115/57 127/75  ?Pulse: 78 80 88 100  ?Resp: 20 (!) 21 (!) 24 (!) 21  ?Temp:      ?TempSrc:      ?SpO2: 93% 90% 98% 98%  ?Weight:      ?Height:      ? ? ?General exam: Moderately built and nourished patient, lying  comfortably supine on the gurney in no obvious distress. ?Head, eyes and ENT: Nontraumatic and normocephalic. Pupils equally reacting to light and accommodation. Oral mucosa moist. ?Neck: Supple. No JVD, carotid bruit or thyromegaly. ?Lymphatics: No lymphadenopathy. ?Respiratory system: Clear to auscultation. No increased work of breathing. ?Cardiovascular system: S1 and S2 heard, RRR. No JVD. ?Gastrointestinal system: Abdomen is nondistended, soft and nontender.  ?Extremities: Symmetric 5 x 5 power. Peripheral pulses symmetrically felt.  ?Skin: No rashes or acute findings. ?Musculoskeletal system: Negative exam. ?Psychiatry: Pleasant and cooperative. ? ? ?Labs on Admission:  ?Basic Metabolic Panel: ?Recent Labs  ?Lab 04/09/22 ?1310  ?NA 145  ?K 3.6  ?CL 112*  ?CO2 24  ?GLUCOSE 127*  ?BUN 25*  ?CREATININE 0.81  ?CALCIUM 8.5*  ? ?Liver Function Tests: ?No results for input(s): AST, ALT, ALKPHOS, BILITOT, PROT, ALBUMIN in the last 168 hours. ?No results for input(s): LIPASE, AMYLASE in the last 168 hours. ?No results for input(s): AMMONIA in the last 168 hours. ?CBC: ?Recent Labs  ?Lab 04/09/22 ?1310  ?WBC 13.4*  ?NEUTROABS 12.1*  ?HGB 11.8*  ?HCT 38.2  ?MCV 99.5  ?PLT 336  ? ?Cardiac Enzymes: ?No results for input(s): CKTOTAL, CKMB, CKMBINDEX, TROPONINI in the last 168 hours. ? ?BNP (last 3 results) ?No results for input(s): PROBNP in the last 8760 hours. ?CBG: ?No results for input(s): GLUCAP in the last 168 hours. ? ?Radiological Exams on Admission: ?DG Chest 2 View ? ?Result Date: 04/09/2022 ?CLINICAL DATA:  Shortness of breath and bradycardia. Poor oxygen saturation. EXAM: CHEST - 2 VIEW COMPARISON:  05/15/2019 FINDINGS: Heart size is normal. Loop recorder in place. The right lung is clear except for chronic scarring at the right base. There is left lower lobe pneumonia. Small amount of pleural fluid on the left. No acute bone finding. IMPRESSION: Left lower lobe pneumonia. Electronically Signed   By: Nelson Chimes  M.D.   On: 04/09/2022 13:43  ? ?CT Head Wo Contrast ? ?Result Date: 04/09/2022 ?CLINICAL DATA:  Delirium.  Mental status changes. EXAM: CT HEAD WITHOUT CONTRAST TECHNIQUE: Contiguous axial images were obtained from the base of the skull through the vertex without intravenous contrast. RADIATION DOSE REDUCTION: This exam was performed according to the departmental dose-optimization program which includes automated exposure control, adjustment of the mA and/or kV according to patient size and/or use of iterative reconstruction technique. COMPARISON:  07/07/2021 FINDINGS: Brain: Chronic and extensive cerebellar infarctions left more than right. Old occipital infarctions left more extensive than right. Old right frontal cortical and subcortical infarction. Chronic small-vessel ischemic changes of the basal ganglia, thalami and hemispheric white matter. No sign of acute infarction, mass lesion, hemorrhage, hydrocephalus or extra-axial collection. Vascular: There is atherosclerotic  calcification of the major vessels at the base of the brain. Skull: Negative Sinuses/Orbits: Clear/normal Other: None IMPRESSION: No acute CT finding. Extensive old infarctions affecting the cerebellum, occipital lobes and right frontal lobe. Chronic small vessel insults of the basal ganglia, thalami and hemispheric white matter. Electronically Signed   By: Nelson Chimes M.D.   On: 04/09/2022 15:42  ? ?CT Angio Chest PE W and/or Wo Contrast ? ?Result Date: 04/09/2022 ?CLINICAL DATA:  Pulmonary embolism suspected.  Positive D-dimer. EXAM: CT ANGIOGRAPHY CHEST WITH CONTRAST TECHNIQUE: Multidetector CT imaging of the chest was performed using the standard protocol during bolus administration of intravenous contrast. Multiplanar CT image reconstructions and MIPs were obtained to evaluate the vascular anatomy. RADIATION DOSE REDUCTION: This exam was performed according to the departmental dose-optimization program which includes automated exposure  control, adjustment of the mA and/or kV according to patient size and/or use of iterative reconstruction technique. CONTRAST:  74m OMNIPAQUE IOHEXOL 350 MG/ML SOLN COMPARISON:  03/07/2020 FINDINGS: Cardiovascula

## 2022-04-09 NOTE — Progress Notes (Signed)
Elink following code sepsis °

## 2022-04-10 DIAGNOSIS — J9601 Acute respiratory failure with hypoxia: Secondary | ICD-10-CM | POA: Diagnosis not present

## 2022-04-10 DIAGNOSIS — J189 Pneumonia, unspecified organism: Secondary | ICD-10-CM | POA: Diagnosis not present

## 2022-04-10 DIAGNOSIS — A419 Sepsis, unspecified organism: Secondary | ICD-10-CM

## 2022-04-10 LAB — BASIC METABOLIC PANEL
Anion gap: 11 (ref 5–15)
BUN: 17 mg/dL (ref 8–23)
CO2: 24 mmol/L (ref 22–32)
Calcium: 8.4 mg/dL — ABNORMAL LOW (ref 8.9–10.3)
Chloride: 108 mmol/L (ref 98–111)
Creatinine, Ser: 0.56 mg/dL (ref 0.44–1.00)
GFR, Estimated: >60 mL/min (ref >60)
Glucose, Bld: 133 mg/dL — ABNORMAL HIGH (ref 70–99)
Potassium: 3.6 mmol/L (ref 3.5–5.1)
Sodium: 143 mmol/L (ref 135–145)

## 2022-04-10 LAB — CBC
HCT: 35.9 % — ABNORMAL LOW (ref 36.0–46.0)
Hemoglobin: 11.2 g/dL — ABNORMAL LOW (ref 12.0–15.0)
MCH: 31.4 pg (ref 26.0–34.0)
MCHC: 31.2 g/dL (ref 30.0–36.0)
MCV: 100.6 fL — ABNORMAL HIGH (ref 80.0–100.0)
Platelets: 266 10*3/uL (ref 150–400)
RBC: 3.57 MIL/uL — ABNORMAL LOW (ref 3.87–5.11)
RDW: 12.8 % (ref 11.5–15.5)
WBC: 12.2 10*3/uL — ABNORMAL HIGH (ref 4.0–10.5)
nRBC: 0 % (ref 0.0–0.2)

## 2022-04-10 LAB — CREATININE, SERUM
Creatinine, Ser: 0.64 mg/dL (ref 0.44–1.00)
GFR, Estimated: >60 mL/min (ref >60)

## 2022-04-10 LAB — HIV ANTIBODY (ROUTINE TESTING W REFLEX): HIV Screen 4th Generation wRfx: NONREACTIVE

## 2022-04-10 MED ORDER — IPRATROPIUM-ALBUTEROL 0.5-2.5 (3) MG/3ML IN SOLN
3.0000 mL | RESPIRATORY_TRACT | Status: DC | PRN
  Administered 2022-04-10: 3 mL via RESPIRATORY_TRACT
  Filled 2022-04-10: qty 3

## 2022-04-10 MED ORDER — DEXTROSE-NACL 5-0.45 % IV SOLN: INTRAVENOUS | Status: AC

## 2022-04-10 NOTE — TOC Initial Note (Signed)
Transition of Care (TOC) - Initial/Assessment Note  ? ? ?Patient Details  ?Name: Jessica Harrell ?MRN: [id number] ?Date of Birth: 1941/05/18 ? ?Transition of Care (TOC) CM/SW Contact:    ?Liev Brockbank, LCSW ?Phone Number: ?04/10/2022, 1:07 PM ? ?Clinical Narrative:                 ?Have spoken with pt's daughter, Manya Silvas, today to review anticipated dc plans.  (Of note, pt with significant dysarthria due to prior CVA so all info provided by daughter.) ?Daughter confirms that pt is a LTC resident in SNF at [facility] and she fully intends for her to return when medically ready to do so.  Have explained that TOC will help facilitate this when appropriate.  Daughter agreeable for me to contact facility as well.  ?Spoke with Elyse Hsu at [facility] who, also, confirms pt was in a LTC bed under Medicaid and has been at the facility ~ 2 months.  Notes her baseline mobility is total care/ w/c bound.  They are anticipating for pt to readmit to that LOC but note that, if some rehab is recommended, TOC will need to get insurance authorization.   ? ?Expected Discharge Plan: [place] ?Barriers to Discharge: Continued Medical Work up ? ? ?Patient Goals and CMS Choice ?Patient states their goals for this hospitalization and ongoing recovery are:: return to [facility]) ?  ?  ? ?Expected Discharge Plan and Services ?Expected Discharge Plan: [place] ?In-house Referral: Clinical Social Work ?  ?Post Acute Care Choice: Nursing Home ?Living arrangements for the past 2 months: [place] ?                ?DME Arranged: N/A ?DME Agency: NA ?  ?  ?  ?  ?  ?  ?  ?  ? ?Prior Living Arrangements/Services ?Living arrangements for the past 2 months: [place] ?Lives with:: Facility Resident ?Patient language and need for interpreter reviewed:: Yes ?Do you feel safe going back to the place where you live?: Yes      ?Need for Family  Participation in Patient Care: No (Comment) ?Care giver support system in place?: Yes (comment) ?  ?Criminal Activity/Legal Involvement Pertinent to Current Situation/Hospitalization: No - Comment as needed ? ?Activities of Daily Living ?Home Assistive Devices/Equipment: Hospital bed, Wheelchair ?ADL Screening (condition at time of admission) ?Patient's cognitive ability adequate to safely complete daily activities?: No ?Is the patient deaf or have difficulty hearing?: No ?Does the patient have difficulty seeing, even when wearing glasses/contacts?: Yes (messed up from stroke and sometimes better than other times) ?Does the patient have difficulty concentrating, remembering, or making decisions?: Yes ?Patient able to express need for assistance with ADLs?: Yes (can't talk - hx from stroke) ?Does the patient have difficulty dressing or bathing?: Yes ?Independently performs ADLs?: No ?Communication: Independent ?Dressing (OT): Needs assistance ?Is this a change from baseline?: Pre-admission baseline ?Grooming: Needs assistance ?Is this a change from baseline?: Pre-admission baseline ?Feeding: Needs assistance ?Is this a change from baseline?: Pre-admission baseline ?Bathing: Needs assistance ?Is this a change from baseline?: Pre-admission baseline ?Toileting: Needs assistance ?Is this a change from baseline?: Pre-admission baseline ?In/Out Bed: Needs assistance ?Is this a change from baseline?: Pre-admission baseline ?Walks in Home: Needs assistance ?Is this a change from baseline?: Pre-admission baseline ?Does the patient have difficulty walking or climbing stairs?: Yes ?Weakness of Legs: Both ?Weakness of Arms/Hands: Both ? ?Permission Sought/Granted ?Permission  sought to share information with : [employer], Customer service manager ?Permission granted to share information with : Yes, Verbal Permission Granted ? Share Information with NAME: Manya Silvas ? Permission granted to share info w AGENCY:  [facility] ? Permission granted to share info w Relationship: daughter ? Permission granted to share info w Contact Information: [phone] ? ?Emotional Assessment ?Appearance:: Appears stated age ?Attitude/Demeanor/Rapport: Unable to Assess ?Affect (typically observed): Unable to Assess ?Orientation: : Oriented to Self ?Alcohol / Substance Use: Not Applicable ?Psych Involvement: No (comment) ? ?Admission diagnosis:  Hypoxia [R09.02] ?Multifocal pneumonia [J18.9] ?Community acquired pneumonia, unspecified laterality [J18.9] ?Sepsis, due to unspecified organism, unspecified whether acute organ dysfunction present ([place]) [A41.9] ?Patient Active Problem List  ? Diagnosis Date Noted  ? Multifocal pneumonia 04/09/2022  ? Acute respiratory failure with hypoxia ([place]) 04/09/2022  ? Acute ischemic stroke ([place]) 05/15/2019  ? Acute metabolic encephalopathy 36/11/2448  ? Encephalopathy   ? History of left breast cancer 05/09/2019  ? Malignant neoplasm of lower-inner quadrant of left breast in female, estrogen receptor positive ([place]) 04/26/2018  ? ?PCP:  [name], MD ?Pharmacy:   ?[facility] ?[address] ?[place] [address] ?Phone: [phone] Fax: [phone] ? ? ? ? ?Social Determinants of Health (SDOH) Interventions ?  ? ?Readmission Risk Interventions ? ?  04/10/2022  ? 12:52 PM  ?Readmission Risk Prevention Plan  ?Post Dischage Appt Complete  ?Medication Screening Complete  ?Transportation Screening Complete  ? ? ? ?

## 2022-04-10 NOTE — Evaluation (Addendum)
Occupational Therapy Evaluation ?Patient Details ?Name: Jessica Harrell ?MRN: [id number] ?DOB: 11/04/1941 ?Today's Date: 04/10/2022 ? ? ?History of Present Illness Jessica Harrell is a 81 y.o. female past medical history of left-sided breast cancer, history of CVA, tobacco abuse who was sent by the nursing home for somnolence and hypoxia. SHe was found to be hypoxic on room air at 85%. CT scan showed no acute findings, extensive old infarcts affecting cerebellum occipital and right frontal lobe, with chronic small vessel insults of the basal ganglia thalami and hemispheric white matter.  CT angio of the chest showed left lower lobe pneumonia with consolidation and collapse and small left-sided pleural effusion and lesser patchy bronchopneumonia on the right. Patient admitted with multifocal pneumonia.  ? ?Clinical Impression ?  ?Jessica Harrell is a 81 year old woman who presents with above medical history. On evaluation patient presents with chronic deficits from prior strokes; impaired and limited shoulder ROM from stiffness bilaterally, ataxia in left upper extremity, increased tone in right upper extremity, visual impairments, dysphagia and dysarthia. Prior to admission patient living at a facility. Patient presents needing total assistance for ADLs as she has minimal functional use of upper extremities. She is total assist for bed mobility and sit to stand. She was mod assist to pivot to chair but she is unable to take a step. She was min assist for standing balance with one hand on walker while being provided pericare. Physically patient appears to be at her baseline. Functionally she is at her baseline. Recommend return to facility for 24/7 care.  ?   ? ?Recommendations for follow up therapy are one component of a multi-disciplinary discharge planning process, led by the attending physician.  Recommendations may be updated based on patient status, additional functional criteria and insurance  authorization.  ? ?Follow Up Recommendations ? Long-term institutional care without follow-up therapy  ?  ?Assistance Recommended at Discharge Frequent or constant Supervision/Assistance  ?Patient can return home with the following A lot of help with walking and/or transfers;A lot of help with bathing/dressing/bathroom;Direct supervision/assist for medications management;Assistance with feeding;Help with stairs or ramp for entrance;Direct supervision/assist for financial management;Assistance with cooking/housework ? ?  ?Functional Status Assessment ? Patient has not had a recent decline in their functional status  ?Equipment Recommendations ? None recommended by OT  ?  ?Recommendations for Other Services   ? ? ?  ?Precautions / Restrictions Precautions ?Precautions: Fall ?Precaution Comments: tone on the right, leans to the right in seated position ?Restrictions ?Weight Bearing Restrictions: No  ? ?  ? ?Mobility Bed Mobility ?Overal bed mobility: Needs Assistance ?Bed Mobility: Supine to Sit ?  ?  ?Supine to sit: Total assist, +2 for safety/equipment ?  ?  ?  ?  ? ?Transfers ?Overall transfer level: Needs assistance ?  ?Transfers: Sit to/from Stand, Bed to chair/wheelchair/BSC ?Sit to Stand: Total assist ?Stand pivot transfers: Mod assist, +2 safety/equipment ?  ?  ?  ?  ?General transfer comment: Max assist to stand and mod assist to pivot to recliner. Able to maintain standing to be cleaned up with min assist and one hand holding on to walker ?  ? ?  ?Balance Overall balance assessment: Needs assistance ?Sitting-balance support: No upper extremity supported, Feet supported ?Sitting balance-Leahy Scale: Poor ?  ?Postural control: Right lateral lean ?  ?  ?  ?  ?  ?  ?  ?  ?  ?  ?  ?  ?  ?  ?   ? ?  ADL either performed or assessed with clinical judgement  ? ?ADL Overall ADL's : Needs assistance/impaired ?Eating/Feeding: Total assistance ?Eating/Feeding Details (indicate cue type and reason): Total assist ?Grooming:  Total assistance ?  ?Upper Body Bathing: Total assistance ?  ?Lower Body Bathing: Total assistance ?  ?Upper Body Dressing : Total assistance ?  ?Lower Body Dressing: Total assistance ?  ?  ?  ?Toileting- Clothing Manipulation and Hygiene: Total assistance ?  ?Tub/ Shower Transfer: Total assistance ?  ?  ?   ? ? ? ?Vision   ?Additional Comments: Hx of visual impairments from previous strokes -though unable to assess due to communication deficits and only able to follow simple commands  ?   ?Perception   ?  ?Praxis   ?  ? ?Pertinent Vitals/Pain Pain Assessment ?Pain Assessment: Faces ?Faces Pain Scale: No hurt  ? ? ? ?Hand Dominance Right ?  ?Extremity/Trunk Assessment Upper Extremity Assessment ?Upper Extremity Assessment: RUE deficits/detail;LUE deficits/detail ?RUE Deficits / Details: shoulder AROM grossly 60 deg, grossly functional elbow ROM but mostly keeps it extended, grossly functional wrist movement and fingers - appears ataxic on the left ?LUE Deficits / Details: Shoulder AROM grossly 60 deg, grossly functional elbow ROM, gross wrist movement but more tight, grossly able to open and close fingers with tactile cues but fingers splay ?  ?Lower Extremity Assessment ?Lower Extremity Assessment:  (able to bend left knee, incresaed difficulty due to tone to bend knee but grossly able) ?  ?Cervical / Trunk Assessment ?Cervical / Trunk Assessment: Normal ?  ?Communication Communication ?Communication: Expressive difficulties ?  ?Cognition   ?  ?Overall Cognitive Status: Difficult to assess ?  ?  ?  ?  ?  ?  ?  ?  ?  ?  ?  ?  ?  ?  ?  ?  ?General Comments: Hx of cognitive deficits per chart ?  ?  ?General Comments    ? ?  ?Exercises   ?  ?Shoulder Instructions    ? ? ?Home Living Family/patient expects to be discharged to:: Skilled nursing facility ?  ?  ?  ?  ?  ?  ?  ?  ?  ?  ?  ?  ?  ?  ?  ?  ?  ?  ? ?  ?Prior Functioning/Environment Prior Level of Function : Needs assist ?  ?  ?  ?  ?  ?  ?Mobility Comments:  requires physical assistance for transfers ?ADLs Comments: total care of ADLs ?  ? ?  ?  ?OT Problem List: Cardiopulmonary status limiting activity;Decreased strength;Decreased range of motion;Impaired balance (sitting and/or standing);Impaired vision/perception;Decreased coordination;Decreased cognition;Impaired UE functional use;Impaired tone ?  ?   ?OT Treatment/Interventions:    ?  ?OT Goals(Current goals can be found in the care plan section) Acute Rehab OT Goals ?OT Goal Formulation: All assessment and education complete, DC therapy  ?OT Frequency:   ?  ? ?Co-evaluation   ?  ?  ?  ?  ? ?  ?AM-PAC OT "6 Clicks" Daily Activity     ?Outcome Measure Help from another person eating meals?: Total ?Help from another person taking care of personal grooming?: Total ?Help from another person toileting, which includes using toliet, bedpan, or urinal?: Total ?Help from another person bathing (including washing, rinsing, drying)?: Total ?Help from another person to put on and taking off regular upper body clothing?: Total ?Help from another person to put on and taking off regular lower body  clothing?: Total ?6 Click Score: 6 ?  ?End of Session Nurse Communication: Mobility status ? ?Activity Tolerance: Patient tolerated treatment well ?Patient left: in chair;with call bell/phone within reach ? ?OT Visit Diagnosis: Muscle weakness (generalized) (M62.81)  ?              ?Time: 7371-0626 ?OT Time Calculation (min): 31 min ?Charges:  OT General Charges ?$OT Visit: 1 Visit ?OT Evaluation ?$OT Eval Moderate Complexity: 1 Mod ? ?Donovan Gatchel, OTR/L ?Acute Care Rehab Services  ?Office [phone] ?Pager: [phone]  ? ?Delainey Winstanley L Remell Giaimo ?04/10/2022, 1:10 PM ?

## 2022-04-10 NOTE — Progress Notes (Signed)
[facility] ?PROGRESS NOTE ? ? ? ?Progress Note  ?Saher Davee  VQQ:[id number] DOB: 26-Oct-1941 DOA: 04/09/2022 ?PCP: [name], MD  ? ? ? ?Brief Narrative:  ? ?Breeley Bischof is an 81 y.o. female past medical history of left-sided breast cancer, history of CVA, tobacco abuse who was sent by the nursing home for somnolence and hypoxia.  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   ? ? ?Assessment/Plan:  ? ?Acute respiratory failure with hypoxia secondary to multifocal pneumonia: ?Blood cultures drawn on 04/09/2022. ?Continue IV Rocephin and azithromycin. ?She still requiring 10 to 14 L of oxygen to be saturations greater than 92% on a Ventimask. ?Tmax 98.6, leukocytosis improving. ?Consult physical therapy out of bed to chair incentive spirometry. ?Speech eval is pending. ? ?History of CVA: ?Her CVA has significantly affected her speech patient cannot communicate. ?She will have to go back to skilled nursing facility keep the patient n.p.o. ?At the skilled nursing facility she was on pur?ed thickened liquid diet. ? ?Essential hypertension: ?Blood pressure is trending up resume amlodipine. ? ?History hyperlipidemia: ?Continue Crestor. ? ?History of breast cancer: ?Continue Stelara. ? ? ?DVT prophylaxis: lovenox ?Family Communication Daughter ?Status is: Inpatient ?Remains inpatient appropriate because: Acute respiratory failure with hypoxia. ? ? ? ?Code Status:  ? ?  ?Code Status Orders  ?(From admission, onward)  ?  ? ? ?  ? ?  Start     Ordered  ? 04/09/22 1942  Full code  Continuous       ? 04/09/22 1941  ? ?  ?  ? ?  ? ?Code Status History   ? ? Date Active Date Inactive Code Status Order ID Comments User Context  ? 05/15/2019 1834 05/20/2019 1702 Full Code [id number]  Orson Eva, MD ED   ? 05/09/2019 1441 05/10/2019 1255 Full Code [id number]  Coralie Keens, MD Inpatient  ? ?  ? ?Advance Directive Documentation   ? ?Flowsheet Row Most Recent Value  ?Type of Advance Directive Healthcare Power of [place], Living will  ?Pre-existing out of facility DNR order (yellow form or pink MOST form) --  ?"MOST" Form in Place? --  ? ?  ? ? ? ? ?IV Access:  ? ?Peripheral IV ? ? ?Procedures and diagnostic studies:  ? ?DG Chest 2 View ? ?Result Date: 04/09/2022 ?CLINICAL DATA:  Shortness of breath and bradycardia. Poor oxygen saturation. EXAM: CHEST - 2 VIEW COMPARISON:  05/15/2019 FINDINGS: Heart size is normal. Loop recorder in place. The right lung is clear except for chronic scarring at the right base. There is left lower lobe pneumonia. Small amount of pleural fluid on the left. No acute bone finding. IMPRESSION: Left lower lobe pneumonia. Electronically Signed   By: Nelson Chimes M.D.   On: 04/09/2022 13:43  ? ?CT Head Wo Contrast ? ?Result Date: 04/09/2022 ?CLINICAL DATA:  Delirium.  Mental status changes. EXAM: CT HEAD WITHOUT CONTRAST TECHNIQUE: Contiguous axial images were obtained from the base of the skull through the vertex without intravenous contrast. RADIATION DOSE REDUCTION: This exam was performed according to the departmental dose-optimization program which includes automated exposure control, adjustment of the mA and/or kV according to patient size and/or use of iterative reconstruction technique. COMPARISON:  07/07/2021 FINDINGS: Brain: Chronic and extensive cerebellar  infarctions left more than right. Old occipital infarctions left more extensive than right. Old right frontal cortical and subcortical infarction. Chronic small-vessel ischemic changes of the basal ganglia, thalami and hemispheric white matter. No sign of acute infarction, mass lesion, hemorrhage, hydrocephalus or extra-axial collection. Vascular: There is atherosclerotic calcification of the major vessels at the base of the brain.  Skull: Negative Sinuses/Orbits: Clear/normal Other: None IMPRESSION: No acute CT finding. Extensive old infarctions affecting the cerebellum, occipital lobes and right frontal lobe. Chronic small vessel insults of the basal ganglia, thalami and hemispheric white matter. Electronically Signed   By: Nelson Chimes M.D.   On: 04/09/2022 15:42  ? ?CT Angio Chest PE W and/or Wo Contrast ? ?Result Date: 04/09/2022 ?CLINICAL DATA:  Pulmonary embolism suspected.  Positive D-dimer. EXAM: CT ANGIOGRAPHY CHEST WITH CONTRAST TECHNIQUE: Multidetector CT imaging of the chest was performed using the standard protocol during bolus administration of intravenous contrast. Multiplanar CT image reconstructions and MIPs were obtained to evaluate the vascular anatomy. RADIATION DOSE REDUCTION: This exam was performed according to the departmental dose-optimization program which includes automated exposure control, adjustment of the mA and/or kV according to patient size and/or use of iterative reconstruction technique. CONTRAST:  80m OMNIPAQUE IOHEXOL 350 MG/ML SOLN COMPARISON:  03/07/2020 FINDINGS: Cardiovascular: Heart size is normal. Mild coronary artery calcification and aortic atherosclerotic calcification are present. Pulmonary arterial opacification is good. There are no pulmonary emboli. Mediastinum/Nodes: No mediastinal or hilar mass or lymphadenopathy. Lungs/Pleura: There is a small left effusion. There is consolidation and collapse of the left lower lobe consistent with bronchopneumonia. Left upper lobe shows mild emphysema and scarring. Right lung shows chronic scarring at the apex. Chronic scar density is present within the right lower lobe, not significantly changed since April of 2021. There is lesser patchy bronchopneumonia at the right lung base. Upper Abdomen: Negative Musculoskeletal: Old superior endplate compression fracture of T12. Review of the MIP images confirms the above findings. IMPRESSION: Left lower lobe  pneumonia with consolidation and collapse. Small left effusion. Lesser patchy bronchopneumonia at the right lung base. Chronic scarring in the right lung as above, not significantly changed since 2021. No pulmonary emboli. Electronically Signed   By: MNelson ChimesM.D.   On: 04/09/2022 15:41   ? ? ?Medical Consultants:  ? ?None. ? ? ?Subjective:  ? ? ?NQuentin Orenonverbal ? ?Objective:  ? ? ?Vitals:  ? 04/10/22 0119 04/10/22 0125 04/10/22 0400 04/10/22 0700  ?BP:    137/63  ?Pulse: 65   81  ?Resp:    20  ?Temp:   97.7 ?F (36.5 ?C)   ?TempSrc:   Oral   ?SpO2: (!) 86% 92%  90%  ?Weight:      ?Height:      ? ?SpO2: 90 % ?O2 Flow Rate (L/min): 14 L/min ?FiO2 (%): 55 % ? ? ?Intake/Output Summary (Last 24 hours) at 04/10/2022 0730 ?Last data filed at 04/09/2022 1647 ?Gross per 24 hour  ?Intake 1314.6 ml  ?Output --  ?Net 1314.6 ml  ? ?Filed Weights  ? 04/09/22 1209  ?Weight: 45.4 kg  ? ? ?Exam: ?General exam: In no acute distress. ?Respiratory system: Good air movement and clear to auscultation. ?Cardiovascular system: S1 & S2 heard, RRR. No JVD. ?Gastrointestinal system: Abdomen is nondistended, soft and nontender.  ?Extremities: No pedal edema. ?Skin: No rashes, lesions or ulcers ?Psychiatry: Judgement and insight appear normal. Mood & affect appropriate.  ? ? ?Data Reviewed:  ? ? ?Labs: ?Basic Metabolic Panel: ?Recent  Labs  ?Lab 04/09/22 ?1310 04/10/22 ?3254  ?NA 145 143  ?K 3.6 3.6  ?CL 112* 108  ?CO2 24 24  ?GLUCOSE 127* 133*  ?BUN 25* 17  ?CREATININE 0.81 0.56  0.64  ?CALCIUM 8.5* 8.4*  ? ?GFR ?Estimated Creatinine Clearance: 41.5 mL/min (by C-G formula based on SCr of 0.64 mg/dL). ?Liver Function Tests: ?No results for input(s): AST, ALT, ALKPHOS, BILITOT, PROT, ALBUMIN in the last 168 hours. ?No results for input(s): LIPASE, AMYLASE in the last 168 hours. ?No results for input(s): AMMONIA in the last 168 hours. ?Coagulation profile ?Recent Labs  ?Lab 04/09/22 ?1500  ?INR 1.1  ? ?COVID-19 Labs ? ?Recent Labs   ?  04/09/22 ?1310  ?DDIMER 3.77*  ? ? ?Lab Results  ?Component Value Date  ? [place] NEGATIVE 04/09/2022  ? [place] NEGATIVE 05/19/2019  ? [place] NEGATIVE 05/15/2019  ? SARSCOV2NAA NOT DETECTED 05/

## 2022-04-10 NOTE — Evaluation (Signed)
Clinical/Bedside Swallow Evaluation ?Patient Details  ?Name: Jessica Harrell ?MRN: [id number] ?Date of Birth: 1940-12-18 ? ?Today's Date: 04/10/2022 ?Time: SLP Start Time (ACUTE ONLY): 0347 SLP Stop Time (ACUTE ONLY): 1551 ?SLP Time Calculation (min) (ACUTE ONLY): 11 min ? ?Past Medical History:  ?Past Medical History:  ?Diagnosis Date  ? Breast cancer ([place])   ? Right foot pain Nov. 2014  ? Stroke [facility])   ? ?Past Surgical History:  ?Past Surgical History:  ?Procedure Laterality Date  ? BREAST BIOPSY    ? BUBBLE STUDY  05/18/2019  ? Procedure: BUBBLE STUDY;  Surgeon: Elouise Munroe, MD;  Location: [place];  Service: Cardiology;;  ? CATARACT EXTRACTION    ? both eyes  ? EYE SURGERY Left   ? MASTECTOMY W/ SENTINEL NODE BIOPSY Left 05/09/2019  ? Procedure: LEFT MASTECTOMY WITH LEFT AXILLARY SENTINEL LYMPH NODE BIOPSY;  Surgeon: Coralie Keens, MD;  Location: [place];  Service: General;  Laterality: Left;  ? TEE WITHOUT CARDIOVERSION N/A 05/18/2019  ? Procedure: TRANSESOPHAGEAL ECHOCARDIOGRAM (TEE);  Surgeon: Elouise Munroe, MD;  Location: [place];  Service: Cardiology;  Laterality: N/A;  ? ?HPI:  ?pt is a 81 yo female adm to [facility] with respiratory issues.  Pt has PMH + for Extensive old infarctions affecting the  cerebellum, occipital lobes and right frontal lobe. Chronic small  vessel insults of the basal ganglia, thalami and hemispheric white  matter.  CT chest showed Left lower lobe pneumonia with consolidation and collapse. Small  left effusion.     Lesser patchy bronchopneumonia at the right lung base.  Pt has h/o dysphagia - prompting need for modified diets.  Last MBS was 02/2022 that showed a severe oropharyngeal dysphagia with high risk of aspiration regardless of texture modification.   Patient has profound incomplete base of tongue weakness and reduced retraction/contact to pharyngeal wall. BOT weakness and incomplete palate contact results in premature spillage and significant aspiration events.  Concern for hydration, nutritioon and aspiration present.   Recommendation was to consider Dysphagia 1 (Puree) solids;Nectar thick liquid  Liquid Administration via Spoon  Medication Administration Crushed with puree  Compensations Minimize environmental distractions;Slow rate;Small sips/bites;Clear throat intermittently;Chin tuck.  Daughter as educated during prior mbs.  Diet as SNF is dys1/honey.  ?  ?Assessment / Plan / Recommendation  ?Clinical Impression ? Pt awake in bed, sleepy but participative with total cues.  Oral was recently completed by RN, thank you!  Pt having difficulty holding up her head during session *while in chair* and SLP provided assist.  Pt with congested baseline cough that is nonproductive.  Administration of nectar liquids via tsp and thin water via tsp was followed by delayed multiple swallows and immediate congested, nonproductive cough - likely from signficant aspiration.  Per prior MBS as OP, March 2023, pt with gross dysphagia and will aspirate regardless of consistency.  At this time, would recommend she be NPO except good oral care, medications via alternative means and consider palliative consult.  Pt's prognosis for swallow function to return to adequate level is poor given her severe premorbid deficits.  SLP would not recommend to repeat MBS in this pt as it will not change pt outcomes.  Will follow up for family education if indicated. ?SLP Visit Diagnosis: Dysphagia, oropharyngeal phase (R13.12) ?   ?Aspiration Risk ? Risk for inadequate nutrition/hydration;Severe aspiration risk  ?  ?Diet Recommendation NPO  ? ?   ?  ?Other  Recommendations Oral Care Recommendations: Oral care QID   ? ?  Recommendations for follow up therapy are one component of a multi-disciplinary discharge planning process, led by the attending physician.  Recommendations may be updated based on patient status, additional functional criteria and insurance authorization. ? ?Follow up Recommendations Follow  physician's recommendations for discharge plan and follow up therapies  ? ? ?  ?Assistance Recommended at Discharge Frequent or constant Supervision/Assistance  ?Functional Status Assessment Patient has had a recent decline in their functional status and/or demonstrates limited ability to make significant improvements in function in a reasonable and predictable amount of time  ?Frequency and Duration min 1 x/week  ?1 week ?  ?   ? ?Prognosis Prognosis for Safe Diet Advancement: Guarded ?Barriers to Reach Goals: Time post onset  ? ?  ? ?Swallow Study   ?General Date of Onset: 04/10/22 ?HPI: pt is a 81 yo female adm to [facility] with respiratory issues.  Pt has PMH + for Extensive old infarctions affecting the  cerebellum, occipital lobes and right frontal lobe. Chronic small  vessel insults of the basal ganglia, thalami and hemispheric white  matter.  CT chest showed Left lower lobe pneumonia with consolidation and collapse. Small  left effusion.     Lesser patchy bronchopneumonia at the right lung base.  Pt has h/o dysphagia - prompting need for modified diets.  Last MBS was 02/2022 that showed a severe oropharyngeal dysphagia with high risk of aspiration regardless of texture modification.   Patient has profound incomplete base of tongue weakness and reduced retraction/contact to pharyngeal wall. BOT weakness and incomplete palate contact results in premature spillage and significant aspiration events. Concern for hydration, nutritioon and aspiration present.   Recommendation was to consider Dysphagia 1 (Puree) solids;Nectar thick liquid  Liquid Administration via Spoon  Medication Administration Crushed with puree  Compensations Minimize environmental distractions;Slow rate;Small sips/bites;Clear throat intermittently;Chin tuck.  Daughter as educated during prior mbs.  Diet as SNF is dys1/honey. ?Respiratory Status: Nasal cannula ?History of Recent Intubation: No ?Oral Care Completed by SLP: No ?Oral Cavity - Dentition:  Adequate natural dentition ?Vision: Functional for self-feeding ?Baseline Vocal Quality: Normal ?Volitional Cough: Strong ?Volitional Swallow: Unable to elicit  ?  ?Oral/Motor/Sensory Function Overall Oral Motor/Sensory Function: Generalized oral weakness   ?Ice Chips Ice chips: Not tested   ?Thin Liquid Thin Liquid: Impaired ?Presentation: Spoon ?Oral Phase Impairments: Reduced labial seal;Reduced lingual movement/coordination ?Oral Phase Functional Implications: Oral holding ?Pharyngeal  Phase Impairments: Cough - Immediate  ?  ?Nectar Thick Nectar Thick Liquid: Impaired ?Presentation: Spoon ?Oral Phase Impairments: Reduced labial seal ?Oral phase functional implications: Oral holding ?Pharyngeal Phase Impairments: Suspected delayed Swallow;Cough - Immediate;Cough - Delayed;Multiple swallows   ?Honey Thick Honey Thick Liquid: Not tested   ?Puree Puree: Not tested   ?Solid ? ? ?  Solid: Not tested  ? ?  ? ?[name] ?04/10/2022,4:09 PM ? ? ?Kathleen Lime, MS CCC SLP ?Acute Rehab Services ?Office [phone] ?Pager [phone] ? ?

## 2022-04-10 NOTE — Progress Notes (Signed)
Per Dr. Olevia Bowens okay to give meds prior to speech evaluation.  ?

## 2022-04-10 NOTE — Progress Notes (Signed)
PT Cancellation Note ? ?Patient Details ?Name: Jessica Harrell ?MRN: [id number] ?DOB: Sep 17, 1941 ? ? ?Cancelled Treatment:    Reason Eval/Treat Not Completed: PT screened, no needs identified, will sign off (Per OT eval and chart review pt is at or close to baseline of 2+ assist for transfers to wheelchair. no skilled PT needs in acute settting. Recommend pt return to SNF for 24/7 care/assist.) ? ? ?Gwynneth Albright PT, DPT ?Acute Rehabilitation Services ?Office [phone] ?Pager [phone]  ?04/10/2022, 12:51 PM ?

## 2022-04-10 NOTE — Evaluation (Signed)
SLP Cancellation Note ? ?Patient Details ?Name: Jessica Harrell ?MRN: [id number] ?DOB: Dec 21, 1940 ? ? ?Cancelled treatment:       Reason Eval/Treat Not Completed: Other (comment) (pt with OT, will continue efforts) ? ?Kathleen Lime, MS CCC SLP ?Acute Rehab Services ?Office [phone] ?Pager [phone] ? ?Jessica Harrell ?04/10/2022, 11:35 AM ? ? ? ?

## 2022-04-10 NOTE — Progress Notes (Signed)
Sent Secure chat to Dr. Olevia Bowens and made MD aware that patient's daily meds were not given this morning due to concern for aspiration and RN felt it be best to wait for ST eval. MD acknowledged and gave no new orders at this time.  ?

## 2022-04-11 DIAGNOSIS — J189 Pneumonia, unspecified organism: Secondary | ICD-10-CM | POA: Diagnosis not present

## 2022-04-11 DIAGNOSIS — J9601 Acute respiratory failure with hypoxia: Secondary | ICD-10-CM | POA: Diagnosis not present

## 2022-04-11 DIAGNOSIS — A419 Sepsis, unspecified organism: Secondary | ICD-10-CM | POA: Diagnosis not present

## 2022-04-11 NOTE — Progress Notes (Signed)
PT's SaO2 dropping to 85% while on 55% FiO2 via venturi mask. PT with no increased WOB or outward distress. PT's cough, when present, is very weak. Started chest physiotherapy by bed percussion and vibration. No improvement in SaO2 with appropriate waveform. PT was placed on NRBM 15 L/min to provide 100% FiO2 with slow increase in pulse ox readings to 92-95%.  ?

## 2022-04-11 NOTE — Progress Notes (Signed)
[facility] ?PROGRESS NOTE ? ? ? ?Progress Note  ?Jessica Harrell  BTD:[id number] DOB: 09/05/1941 DOA: 04/09/2022 ?PCP: [name], MD  ? ? ? ?Brief Narrative:  ? ?Jessica Harrell is an 81 y.o. female past medical history of left-sided breast cancer, history of CVA, tobacco abuse who was sent by the nursing home for somnolence and hypoxia.  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   ? ? ?Assessment/Plan:  ? ?Acute respiratory failure with hypoxia secondary to multifocal pneumonia: ?Blood cultures drawn on 04/09/2022. ?Continue IV Rocephin and azithromycin. ?Still requiring 14 L of oxygen to keep saturations greater 92%. ?She has defervesced leukocytosis improved. ?Speech evaluated the patient high risk for aspiration event for pills. ? ?History of CVA: ?Her CVA has significantly affected her speech patient cannot communicate. ?She will have to go back to skilled nursing facility keep the patient n.p.o. ?At the skilled nursing facility she was on pur?ed thickened liquid diet. ? ?Essential hypertension: ?Blood pressure is trending up resume amlodipine. ? ?History hyperlipidemia: ?Continue Crestor. ? ?History of breast cancer: ?Discontinue Stelara ? ? ?DVT prophylaxis: lovenox ?Family Communication Daughter ?Status is: Inpatient ?Remains inpatient appropriate because: Acute respiratory failure with hypoxia. ? ? ? ?Code Status:  ? ?  ?Code Status Orders  ?(From admission, onward)  ?  ? ? ?  ? ?  Start     Ordered  ? 04/09/22 1942  Full code  Continuous       ? 04/09/22 1941  ? ?  ?  ? ?  ? ?Code Status History   ? ? Date Active Date Inactive Code Status Order ID Comments User Context  ? 05/15/2019 1834 05/20/2019 1702 Full Code [id number]  Orson Eva, MD ED  ? 05/09/2019 1441 05/10/2019 1255 Full  Code [id number]  Coralie Keens, MD Inpatient  ? ?  ? ?Advance Directive Documentation   ? ?Flowsheet Row Most Recent Value  ?Type of Advance Directive Healthcare Power of [place], Living will  ?Pre-existing out of facility DNR order (yellow form or pink MOST form) --  ?"MOST" Form in Place? --  ? ?  ? ? ? ? ?IV Access:  ? ?Peripheral IV ? ? ?Procedures and diagnostic studies:  ? ?DG Chest 2 View ? ?Result Date: 04/09/2022 ?CLINICAL DATA:  Shortness of breath and bradycardia. Poor oxygen saturation. EXAM: CHEST - 2 VIEW COMPARISON:  05/15/2019 FINDINGS: Heart size is normal. Loop recorder in place. The right lung is clear except for chronic scarring at the right base. There is left lower lobe pneumonia. Small amount of pleural fluid on the left. No acute bone finding. IMPRESSION: Left lower lobe pneumonia. Electronically Signed   By: Nelson Chimes M.D.   On: 04/09/2022 13:43  ? ?CT Head Wo Contrast ? ?Result Date: 04/09/2022 ?CLINICAL DATA:  Delirium.  Mental status changes. EXAM: CT HEAD WITHOUT CONTRAST TECHNIQUE: Contiguous axial images were obtained from the base of the skull through the vertex without intravenous contrast. RADIATION DOSE REDUCTION: This exam was performed according to the departmental dose-optimization program which includes automated exposure control, adjustment of the mA and/or kV according to patient size and/or use of iterative reconstruction technique. COMPARISON:  07/07/2021 FINDINGS: Brain: Chronic and extensive cerebellar infarctions left more than right. Old occipital infarctions left  more extensive than right. Old right frontal cortical and subcortical infarction. Chronic small-vessel ischemic changes of the basal ganglia, thalami and hemispheric white matter. No sign of acute infarction, mass lesion, hemorrhage, hydrocephalus or extra-axial collection. Vascular: There is atherosclerotic calcification of the major vessels at the base of the brain. Skull: Negative Sinuses/Orbits:  Clear/normal Other: None IMPRESSION: No acute CT finding. Extensive old infarctions affecting the cerebellum, occipital lobes and right frontal lobe. Chronic small vessel insults of the basal ganglia, thalami and hemispheric white matter. Electronically Signed   By: Nelson Chimes M.D.   On: 04/09/2022 15:42  ? ?CT Angio Chest PE W and/or Wo Contrast ? ?Result Date: 04/09/2022 ?CLINICAL DATA:  Pulmonary embolism suspected.  Positive D-dimer. EXAM: CT ANGIOGRAPHY CHEST WITH CONTRAST TECHNIQUE: Multidetector CT imaging of the chest was performed using the standard protocol during bolus administration of intravenous contrast. Multiplanar CT image reconstructions and MIPs were obtained to evaluate the vascular anatomy. RADIATION DOSE REDUCTION: This exam was performed according to the departmental dose-optimization program which includes automated exposure control, adjustment of the mA and/or kV according to patient size and/or use of iterative reconstruction technique. CONTRAST:  25m OMNIPAQUE IOHEXOL 350 MG/ML SOLN COMPARISON:  03/07/2020 FINDINGS: Cardiovascular: Heart size is normal. Mild coronary artery calcification and aortic atherosclerotic calcification are present. Pulmonary arterial opacification is good. There are no pulmonary emboli. Mediastinum/Nodes: No mediastinal or hilar mass or lymphadenopathy. Lungs/Pleura: There is a small left effusion. There is consolidation and collapse of the left lower lobe consistent with bronchopneumonia. Left upper lobe shows mild emphysema and scarring. Right lung shows chronic scarring at the apex. Chronic scar density is present within the right lower lobe, not significantly changed since April of 2021. There is lesser patchy bronchopneumonia at the right lung base. Upper Abdomen: Negative Musculoskeletal: Old superior endplate compression fracture of T12. Review of the MIP images confirms the above findings. IMPRESSION: Left lower lobe pneumonia with consolidation and  collapse. Small left effusion. Lesser patchy bronchopneumonia at the right lung base. Chronic scarring in the right lung as above, not significantly changed since 2021. No pulmonary emboli. Electronically Signed   By: MNelson ChimesM.D.   On: 04/09/2022 15:41   ? ? ?Medical Consultants:  ? ?None. ? ? ?Subjective:  ? ? ?NQuentin Orenonverbal this morning. ? ?Objective:  ? ? ?Vitals:  ? 04/11/22 0300 04/11/22 0400 04/11/22 0500 04/11/22 0600  ?BP: (!) 106/44 (!) 129/53 (!) 103/40 (!) 101/38  ?Pulse: (!) 54 72 (!) 58 66  ?Resp: '19 17 18 19  '$ ?Temp:  97.8 ?F (36.6 ?C)    ?TempSrc:  Axillary    ?SpO2: 97% 100% 100% 98%  ?Weight:      ?Height:      ? ?SpO2: 98 % ?O2 Flow Rate (L/min): 15 L/min ?FiO2 (%): 55 % ? ? ?Intake/Output Summary (Last 24 hours) at 04/11/2022 0657 ?Last data filed at 04/11/2022 0600 ?Gross per 24 hour  ?Intake 2784.11 ml  ?Output 775 ml  ?Net 2009.11 ml  ? ? ?Filed Weights  ? 04/09/22 1209  ?Weight: 45.4 kg  ? ? ?Exam: ?General exam: In no acute distress. ?Respiratory system: Good air movement and clear to auscultation. ?Cardiovascular system: S1 & S2 heard, RRR. No JVD. ?Gastrointestinal system: Abdomen is nondistended, soft and nontender.  ?Extremities: No pedal edema. ?Skin: No rashes, lesions or ulcers ? ? ? ?Data Reviewed:  ? ? ?Labs: ?Basic Metabolic Panel: ?Recent Labs  ?Lab 04/09/22 ?1310 04/10/22 ?07989 ?NA 145  143  ?K 3.6 3.6  ?CL 112* 108  ?CO2 24 24  ?GLUCOSE 127* 133*  ?BUN 25* 17  ?CREATININE 0.81 0.56  0.64  ?CALCIUM 8.5* 8.4*  ? ? ?GFR ?Estimated Creatinine Clearance: 41.5 mL/min (by C-G formula based on SCr of 0.64 mg/dL). ?Liver Function Tests: ?No results for input(s): AST, ALT, ALKPHOS, BILITOT, PROT, ALBUMIN in the last 168 hours. ?No results for input(s): LIPASE, AMYLASE in the last 168 hours. ?No results for input(s): AMMONIA in the last 168 hours. ?Coagulation profile ?Recent Labs  ?Lab 04/09/22 ?1500  ?INR 1.1  ? ? ?COVID-19 Labs ? ?Recent Labs  ?  04/09/22 ?1310  ?DDIMER  3.77*  ? ? ? ?Lab Results  ?Component Value Date  ? [place] NEGATIVE 04/09/2022  ? [place] NEGATIVE 05/19/2019  ? [place] NEGATIVE 05/15/2019  ? [place] NOT DETECTED 05/05/2019  ? ? ?CBC: ?Recent Lab

## 2022-04-12 DIAGNOSIS — J9601 Acute respiratory failure with hypoxia: Secondary | ICD-10-CM | POA: Diagnosis not present

## 2022-04-12 DIAGNOSIS — A419 Sepsis, unspecified organism: Secondary | ICD-10-CM | POA: Diagnosis not present

## 2022-04-12 DIAGNOSIS — J189 Pneumonia, unspecified organism: Secondary | ICD-10-CM | POA: Diagnosis not present

## 2022-04-12 LAB — GLUCOSE, CAPILLARY
Glucose-Capillary: 118 mg/dL — ABNORMAL HIGH (ref 70–99)
Glucose-Capillary: 109 mg/dL — ABNORMAL HIGH (ref 70–99)

## 2022-04-12 MED ORDER — ACETAMINOPHEN 325 MG PO TABS: 650.0000 mg | ORAL_TABLET | Freq: Four times a day (QID) | ORAL | Status: DC | PRN

## 2022-04-12 MED ORDER — ACETAMINOPHEN 650 MG RE SUPP: 650.0000 mg | Freq: Four times a day (QID) | RECTAL | Status: DC | PRN

## 2022-04-12 MED ORDER — DEXTROSE-NACL 5-0.45 % IV SOLN: INTRAVENOUS | Status: DC

## 2022-04-12 MED ORDER — SODIUM CHLORIDE 0.9% FLUSH: 3.0000 mL | INTRAVENOUS | Status: DC | PRN

## 2022-04-12 MED ORDER — MORPHINE SULFATE (PF) 2 MG/ML IV SOLN
2.0000 mg | INTRAVENOUS | Status: DC | PRN
Start: 2022-04-12 — End: 2022-04-14

## 2022-04-12 MED ORDER — LORAZEPAM 2 MG/ML PO CONC
1.0000 mg | ORAL | Status: DC | PRN
  Filled 2022-04-12: qty 0.5

## 2022-04-12 MED ORDER — LORAZEPAM 2 MG/ML IJ SOLN: 1.0000 mg | INTRAMUSCULAR | Status: DC | PRN

## 2022-04-12 MED ORDER — ONDANSETRON 4 MG PO TBDP: 4.0000 mg | ORAL_TABLET | Freq: Four times a day (QID) | ORAL | Status: DC | PRN

## 2022-04-12 MED ORDER — SODIUM CHLORIDE 0.9% FLUSH
3.0000 mL | Freq: Two times a day (BID) | INTRAVENOUS | Status: DC
  Administered 2022-04-12 – 2022-04-13 (×4): 3 mL via INTRAVENOUS

## 2022-04-12 MED ORDER — SODIUM CHLORIDE 0.9 % IV SOLN: 250.0000 mL | INTRAVENOUS | Status: DC | PRN

## 2022-04-12 MED ORDER — MORPHINE SULFATE (CONCENTRATE) 10 MG/0.5ML PO SOLN
5.0000 mg | ORAL | Status: DC | PRN
  Administered 2022-04-13: 5 mg via SUBLINGUAL
  Filled 2022-04-12: qty 0.5

## 2022-04-12 MED ORDER — ONDANSETRON HCL 4 MG/2ML IJ SOLN: 4.0000 mg | Freq: Four times a day (QID) | INTRAMUSCULAR | Status: DC | PRN

## 2022-04-12 MED ORDER — GLYCOPYRROLATE 0.2 MG/ML IJ SOLN
0.2000 mg | INTRAMUSCULAR | Status: DC | PRN
  Administered 2022-04-13: 0.2 mg via INTRAVENOUS
  Filled 2022-04-12: qty 1

## 2022-04-12 MED ORDER — MORPHINE SULFATE (CONCENTRATE) 10 MG/0.5ML PO SOLN: 5.0000 mg | ORAL | Status: DC | PRN

## 2022-04-12 MED ORDER — LORAZEPAM 1 MG PO TABS
1.0000 mg | ORAL_TABLET | ORAL | Status: DC | PRN
Start: 2022-04-12 — End: 2022-04-14

## 2022-04-12 NOTE — Progress Notes (Signed)
[facility] ?PROGRESS NOTE ? ? ? ?Progress Note  ?Jessica Harrell  MAU:[id number] DOB: 09-20-1941 DOA: 04/09/2022 ?PCP: [name], MD  ? ? ? ?Brief Narrative:  ? ?Jessica Harrell is an 81 y.o. female past medical history of left-sided breast cancer, history of CVA, tobacco abuse who was sent by the nursing home for somnolence and hypoxia.  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   ? ? ?Assessment/Plan:  ? ?Acute respiratory failure with hypoxia secondary to multifocal pneumonia: ?Blood cultures drawn on 04/09/2022 negative till date. ?Continue IV Rocephin and azithromycin. ?Started on 15 L has defervesced she continues to aspirate. ?Speech evaluated the patient high risk for aspiration event for pills. ?I did discuss with the daughter yesterday that she has had high risk of recurrent severe aspirations. ?The daughter seems to understand she would like to talk it over with the family before proceeding towards comfort care. ? ?History of CVA: ?Her CVA has significantly affected her speech patient cannot communicate. ?She will have to go back to skilled nursing facility keep the patient n.p.o. ?Place her n.p.o. due to high risk of aspiration. ? ?Essential hypertension: ?Blood pressure is trending up resume amlodipine. ? ?History hyperlipidemia: ?Continue Crestor. ? ?History of breast cancer: ?Discontinue Stelara ? ? ?DVT prophylaxis: lovenox ?Family Communication Daughter ?Status is: Inpatient ?Remains inpatient appropriate because: Acute respiratory failure with hypoxia. ? ? ? ?Code Status:  ? ?  ?Code Status Orders  ?(From admission, onward)  ?  ? ? ?  ? ?  Start     Ordered  ? 04/09/22 1942  Full code  Continuous       ? 04/09/22 1941  ? ?  ?  ? ?  ? ?Code Status History   ? ? Date  Active Date Inactive Code Status Order ID Comments User Context  ? 05/15/2019 1834 05/20/2019 1702 Full Code [id number]  Orson Eva, MD ED  ? 05/09/2019 1441 05/10/2019 1255 Full Code [id number]  Coralie Keens, MD Inpatient  ? ?  ? ?Advance Directive Documentation   ? ?Flowsheet Row Most Recent Value  ?Type of Advance Directive Healthcare Power of [place], Living will  ?Pre-existing out of facility DNR order (yellow form or pink MOST form) --  ?"MOST" Form in Place? --  ? ?  ? ? ? ? ?IV Access:  ? ?Peripheral IV ? ? ?Procedures and diagnostic studies:  ? ?No results found. ? ? ?Medical Consultants:  ? ?None. ? ? ?Subjective:  ? ? ?[name] nonverbal. ? ?Objective:  ? ? ?Vitals:  ? 04/12/22 0300 04/12/22 0400 04/12/22 0500 04/12/22 0600  ?BP: (!) 123/57 (!) 114/48 (!) 125/45 (!) 128/49  ?Pulse: 72 62  63  ?Resp: 19 (!) 25 (!) 24 (!) 21  ?Temp:  (!) 97.4 ?F (36.3 ?C)    ?TempSrc:  Axillary    ?SpO2: 100% 100%  95%  ?Weight:      ?Height:      ? ?SpO2: 95 % ?O2 Flow Rate (L/min): 15 L/min ?FiO2 (%): 55 % ? ? ?Intake/Output Summary (Last 24 hours) at 04/12/2022 0651 ?Last data filed at 04/12/2022 2620 ?Gross per 24 hour  ?Intake 1706.9 ml  ?Output 450 ml  ?Net 1256.9 ml  ? ? ?Filed Weights  ?  04/09/22 1209  ?Weight: 45.4 kg  ? ? ?Exam: ?General exam: In no acute distress. ?Respiratory system: Good air movement and crackles at bases. ?Cardiovascular system: S1 & S2 heard, RRR. No JVD. ?Gastrointestinal system: Abdomen is nondistended, soft and nontender.  ?Extremities: No pedal edema. ?Skin: No rashes, lesions or ulcers ? ?Data Reviewed:  ? ? ?Labs: ?Basic Metabolic Panel: ?Recent Labs  ?Lab 04/09/22 ?1310 04/10/22 ?4098  ?NA 145 143  ?K 3.6 3.6  ?CL 112* 108  ?CO2 24 24  ?GLUCOSE 127* 133*  ?BUN 25* 17  ?CREATININE 0.81 0.56  0.64  ?CALCIUM 8.5* 8.4*  ? ? ?GFR ?Estimated Creatinine Clearance: 41.5 mL/min (by C-G formula based on SCr of 0.64 mg/dL). ?Liver Function Tests: ?No results for input(s): AST, ALT, ALKPHOS,  BILITOT, PROT, ALBUMIN in the last 168 hours. ?No results for input(s): LIPASE, AMYLASE in the last 168 hours. ?No results for input(s): AMMONIA in the last 168 hours. ?Coagulation profile ?Recent Labs  ?Lab 04/09/22 ?1500  ?INR 1.1  ? ? ?COVID-19 Labs ? ?Recent Labs  ?  04/09/22 ?1310  ?DDIMER 3.77*  ? ? ? ?Lab Results  ?Component Value Date  ? [place] NEGATIVE 04/09/2022  ? [place] NEGATIVE 05/19/2019  ? [place] NEGATIVE 05/15/2019  ? [place] NOT DETECTED 05/05/2019  ? ? ?CBC: ?Recent Labs  ?Lab 04/09/22 ?1310 04/10/22 ?1191  ?WBC 13.4* 12.2*  ?NEUTROABS 12.1*  --   ?HGB 11.8* 11.2*  ?HCT 38.2 35.9*  ?MCV 99.5 100.6*  ?PLT 336 266  ? ? ?Cardiac Enzymes: ?No results for input(s): CKTOTAL, CKMB, CKMBINDEX, TROPONINI in the last 168 hours. ?BNP (last 3 results) ?No results for input(s): PROBNP in the last 8760 hours. ?CBG: ?Recent Labs  ?Lab 04/12/22 ?4782 04/12/22 ?0622  ?GLUCAP 118* 109*  ? ?D-Dimer: ?Recent Labs  ?  04/09/22 ?1310  ?DDIMER 3.77*  ? ? ?Hgb A1c: ?No results for input(s): HGBA1C in the last 72 hours. ?Lipid Profile: ?No results for input(s): CHOL, HDL, LDLCALC, TRIG, CHOLHDL, LDLDIRECT in the last 72 hours. ?Thyroid function studies: ?No results for input(s): TSH, T4TOTAL, T3FREE, THYROIDAB in the last 72 hours. ? ?Invalid input(s): FREET3 ?Anemia work up: ?No results for input(s): VITAMINB12, FOLATE, FERRITIN, TIBC, IRON, RETICCTPCT in the last 72 hours. ?Sepsis Labs: ?Recent Labs  ?Lab 04/09/22 ?1310 04/09/22 ?1432 04/10/22 ?9562  ?WBC 13.4*  --  12.2*  ?LATICACIDVEN  --  1.9  --   ? ? ?Microbiology ?Recent Results (from the past 240 hour(s))  ?Resp Panel by RT-PCR (Flu A&B, Covid) Nasopharyngeal Swab     Status: None  ? Collection Time: 04/09/22  2:30 PM  ? Specimen: Nasopharyngeal Swab; Nasopharyngeal(NP) swabs in vial transport medium  ?Result Value Ref Range Status  ? SARS Coronavirus 2 by RT PCR NEGATIVE NEGATIVE Final  ?  Comment: (NOTE) ?SARS-CoV-2 target nucleic acids are  NOT DETECTED. ? ?The SARS-CoV-2 RNA is generally detectable in upper respiratory ?specimens during the acute phase of infection. The lowest ?concentration of SARS-CoV-2 viral copies this assay can detect is ?138 copies/mL. A negative result does not preclude SARS-Cov-2 ?infection and should not be used as the sole basis for treatment or ?other patient management decisions. A negative result may occur with  ?improper specimen collection/handling, submission of specimen other ?than nasopharyngeal swab, presence of viral mutation(s) within the ?areas targeted by this assay, and inadequate number of viral ?copies(<138 copies/mL). A negative result must be combined with ?clinical observations, patient history, and epidemiological ?information. The expected result is Negative. ? ?  Fact Sheet for Patients:  ?EntrepreneurPulse.com.au ? ?Fact Sheet for Healthcare Providers:  ?IncredibleEmployment.be ? ?This test is no t yet approved or cleared by the [place] FDA and  ?has been authorized for detection and/or diagnosis of SARS-CoV-2 by ?FDA under an Emergency Use Authorization (EUA). This EUA will remain  ?in effect (meaning this test can be used) for the duration of the ?COVID-19 declaration under Section 564(b)(1) of the Act, 21 ?U.S.C.section 360bbb-3(b)(1), unless the authorization is terminated  ?or revoked sooner.  ? ? ?  ? Influenza A by PCR NEGATIVE NEGATIVE Final  ? Influenza B by PCR NEGATIVE NEGATIVE Final  ?  Comment: (NOTE) ?The Xpert Xpress SARS-CoV-2/FLU/RSV plus assay is intended as an aid ?in the diagnosis of influenza from Nasopharyngeal swab specimens and ?should not be used as a sole basis for treatment. Nasal washings and ?aspirates are unacceptable for Xpert Xpress SARS-CoV-2/FLU/RSV ?testing. ? ?Fact Sheet for Patients: ?EntrepreneurPulse.com.au ? ?Fact Sheet for Healthcare Providers: ?IncredibleEmployment.be ? ?This test is not  yet approved or cleared by the [place] FDA and ?has been authorized for detection and/or diagnosis of SARS-CoV-2 by ?FDA under an Emergency Use Authorization (EUA). This EUA will remain ?in effect (mea

## 2022-04-12 NOTE — Progress Notes (Addendum)
Had a conversation this morning with her daughter who is her healthcare power of attorney Mrs. Tamala Julian, we discussed the risk and benefits of ongoing treatment and after explaining to her her high risk of aspiration and after thinking about it for over 24 hours, she communicated with the family and they decided to move towards comfort care. ?The agreed to stop all medications and stop all lab draws. ?She agreed to make the patient DNR/DNI. ?She would like her mother to be placed at [facility] in a residential hospice facility. ?We have DC'd all the antibiotics use only medications for comfort. ?She had poor oral intake her life expectancy is less than 4 weeks. ?We will go ahead and transfer to [place].  We will get TOC involved to try to get her to residential hospice facility at [facility]. ?

## 2022-04-12 NOTE — TOC Progression Note (Signed)
Transition of Care (TOC) - Progression Note  ? ? ?Patient Details  ?Name: Jessica Harrell ?MRN: [id number] ?Date of Birth: 05-Feb-1941 ? ?Transition of Care (TOC) CM/SW Contact  ?Ross Ludwig, LCSW ?Phone Number: ?04/12/2022, 4:41 PM ? ?Clinical Narrative:    ? ?CSW received consult that patient will need residential hospice placement and has been switched to comfort care.  CSW spoke to patient's daughter [place] [phone], and she is requesting [employer] through [place].  CSW contacted  [facility] and they said there are not any beds available currently, but to fax referral to [phone] and they will review it.  CSW faxed requested clinical information.  Weekday CSW to follow up on Monday about bed availability. ? ?Expected Discharge Plan: [place] ?Barriers to Discharge: Continued Medical Work up ? ?Expected Discharge Plan and Services ?Expected Discharge Plan: [place] ?In-house Referral: Clinical Social Work ?  ?Post Acute Care Choice: Nursing Home ?Living arrangements for the past 2 months: [name] ?                ?DME Arranged: N/A ?DME Agency: NA ?  ?  ?  ?  ?  ?  ?  ?  ? ? ?Social Determinants of Health (SDOH) Interventions ?  ? ?Readmission Risk Interventions ? ?  04/10/2022  ? 12:52 PM  ?Readmission Risk Prevention Plan  ?Post Dischage Appt Complete  ?Medication Screening Complete  ?Transportation Screening Complete  ? ? ?

## 2022-04-12 NOTE — Progress Notes (Signed)
Spoke with Dr. Olevia Bowens and asked if patient should go to the floor on venti mask or transition to nasal cannula or simple mask since patient has transitioned to comfort care. MD asked if oxygen could be decreased to keep sats at 86 %. Patient placed on 5L nasal cannula and oxygen sat is 93%. MD aware and gave order to discontinue continuous pulse ox order and send patient to floor.  ?

## 2022-04-12 NOTE — Progress Notes (Signed)
Patient transferred to room 1335 by bed with this RN and Anderson [place], [place]. Patient transferred on 5L [place]. Alert with no distress noted after moving patient to new bed in room. [name], RN at bedside who is now taking over patient's care. This RN called and spoke with patient's daughter [place] and made her aware that patient has been moved to a new room.  ?

## 2022-04-12 NOTE — Progress Notes (Signed)
Palliative care brief note ? ?Palliative care consult received.  Chart reviewed. ? ?Noted that Dr. Venetia Constable has already spoken with family today and plan is now for full comfort with discharge to residential hospice facility. ? ?I briefly checked in on Jessica Harrell and discussed with bedside RN.  Symptomatically, she appears well controlled at this time. ? ?Agree with plan and appropriateness for residential hospice.  Reviewed comfort medications and made addition of Robinul for any secretions. ? ?With goals now clearly defined for comfort and residential hospice and no identified symptom management needs, we will hold on formal consult at this time.  I would be happy to further engage if I can be of assistance in the care of Jessica Harrell this hospitalization.  Please call for any palliative specific needs. ? ?Micheline Rough, MD ?[facility] Team ?[phone] ? ?NO CHARGE NOTE ?

## 2022-04-13 DIAGNOSIS — R0902 Hypoxemia: Secondary | ICD-10-CM

## 2022-04-13 DIAGNOSIS — J9601 Acute respiratory failure with hypoxia: Secondary | ICD-10-CM | POA: Diagnosis not present

## 2022-04-13 DIAGNOSIS — J690 Pneumonitis due to inhalation of food and vomit: Secondary | ICD-10-CM | POA: Diagnosis not present

## 2022-04-13 DIAGNOSIS — J189 Pneumonia, unspecified organism: Secondary | ICD-10-CM | POA: Diagnosis not present

## 2022-04-13 MED ORDER — GLYCOPYRROLATE 0.2 MG/ML IJ SOLN: 0.2000 mg | INTRAMUSCULAR | Status: DC | PRN

## 2022-04-13 MED ORDER — LORAZEPAM 2 MG/ML PO CONC
1.0000 mg | ORAL | 0 refills | Status: DC | PRN
Start: 2022-04-13 — End: 2023-10-26

## 2022-04-13 MED ORDER — MORPHINE SULFATE (PF) 2 MG/ML IV SOLN: 2.0000 mg | INTRAVENOUS | 0 refills | Status: DC | PRN

## 2022-04-13 NOTE — Discharge Summary (Signed)
Physician Discharge Summary  ?Jessica Harrell ZOX:[id number] DOB: 1941/05/29 DOA: 04/09/2022 ? ?PCP: [name], MD ? ?Admit date: 04/09/2022 ?Discharge date: 04/13/2022 ? ?Admitted From: Home ?Disposition:  Residential Hospice facility ? ?Recommendations for Outpatient Follow-up:  ?None ? ?Home Health:no ?Equipment/Devices:Oxygen 6 L ? ?Discharge Condition:Hospice  ?CODE STATUS:DNR ?Diet recommendation: Comfort feeds ? ?Brief/Interim Summary: ?81 y.o. female past medical history of left-sided breast cancer, history of CVA, tobacco abuse who was sent by the nursing home for somnolence and hypoxia.  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   ? ?Discharge Diagnoses:  ?Principal Problem: ?  Multifocal pneumonia ?Active Problems: ?  History of left breast cancer ?  Acute respiratory failure with hypoxia ([place]) ? ?Acute respiratory failure with hypoxia secondary to multifocal pneumonia due to aspiration pneumonia: ?The cultures were drawn and they remain negative. ?She was started on IV Rocephin and azithromycin. ?Throughout her hospital stay she was on 15 L of oxygen and she defervesced. ?Is likely due to aspiration. ?Speech evaluated the patient and deemed her high risk for aspiration. ?After long discussion with the daughter she decided to move towards comfort care. ?All antibiotics were stopped. ?She was only On morphine and Ativan for agitation and shortness of breath. ?The daughter requested to be transferred to residential hospice facility. ? ?History of CVA: ?Has significantly affected her speech cannot communicate. ?Daughter agreed to comfort feeds. ? ?Essential hypertension: ?Antihypertensive medication were discontinued. ? ?Hyperlipidemia: ?Continue Crestor pain ? ?History of breast  cancer: ?Discontinue Stelara. ? ?Discharge Instructions ? ?Discharge Instructions   ? ? Diet - low sodium heart healthy   Complete by: As directed ?  ? Increase activity slowly   Complete by: As directed ?  ? ?  ? ?Allergies as of 04/13/2022   ? ?   Reactions  ? Atorvastatin Other (See Comments)  ? Muscle pain   ? ?  ? ?  ?Medication List  ?  ? ?STOP taking these medications   ? ?acetaminophen 325 MG tablet ?Commonly known as: TYLENOL ?  ?amLODipine 2.5 MG tablet ?Commonly known as: NORVASC ?  ?aspirin EC 81 MG tablet ?  ?letrozole 2.5 MG tablet ?Commonly known as: [place] ?  ?mirtazapine 15 MG tablet ?Commonly known as: Remeron ?  ?rosuvastatin 10 MG tablet ?Commonly known as: CRESTOR ?  ? ?  ? ?TAKE these medications   ? ?glycopyrrolate 0.2 MG/ML injection ?Commonly known as: ROBINUL ?Inject 1 mL (0.2 mg total) into the vein every 4 (four) hours as needed (excess secretions). ?  ?LORazepam 2 MG/ML concentrated solution ?Commonly known as: ATIVAN ?Place 0.5 mLs (1 mg total) under the tongue every 4 (four) hours as needed for anxiety. ?  ?morphine (PF) 2 MG/ML injection ?Inject 1 mL (2 mg total) into the vein every 2 (two) hours as needed (SOB). ?  ? ?  ? ? ?Allergies  ?Allergen Reactions  ? Atorvastatin Other (See Comments)  ?  Muscle pain   ? ? ?Consultations: ?None ? ? ?Procedures/Studies: ?DG Chest 2 View ? ?Result Date: 04/09/2022 ?CLINICAL DATA:  Shortness of breath and bradycardia. Poor oxygen saturation. EXAM: CHEST - 2 VIEW COMPARISON:  05/15/2019 FINDINGS: Heart size is normal. Loop recorder in place. The right lung is clear except for chronic scarring at  the right base. There is left lower lobe pneumonia. Small amount of pleural fluid on the left. No acute bone finding. IMPRESSION: Left lower lobe pneumonia. Electronically Signed   By: Nelson Chimes M.D.   On: 04/09/2022 13:43  ? ?CT Head Wo Contrast ? ?Result Date: 04/09/2022 ?CLINICAL DATA:  Delirium.  Mental status changes. EXAM: CT HEAD WITHOUT CONTRAST  TECHNIQUE: Contiguous axial images were obtained from the base of the skull through the vertex without intravenous contrast. RADIATION DOSE REDUCTION: This exam was performed according to the departmental dose-optimization program which includes automated exposure control, adjustment of the mA and/or kV according to patient size and/or use of iterative reconstruction technique. COMPARISON:  07/07/2021 FINDINGS: Brain: Chronic and extensive cerebellar infarctions left more than right. Old occipital infarctions left more extensive than right. Old right frontal cortical and subcortical infarction. Chronic small-vessel ischemic changes of the basal ganglia, thalami and hemispheric white matter. No sign of acute infarction, mass lesion, hemorrhage, hydrocephalus or extra-axial collection. Vascular: There is atherosclerotic calcification of the major vessels at the base of the brain. Skull: Negative Sinuses/Orbits: Clear/normal Other: None IMPRESSION: No acute CT finding. Extensive old infarctions affecting the cerebellum, occipital lobes and right frontal lobe. Chronic small vessel insults of the basal ganglia, thalami and hemispheric white matter. Electronically Signed   By: Nelson Chimes M.D.   On: 04/09/2022 15:42  ? ?CT Angio Chest PE W and/or Wo Contrast ? ?Result Date: 04/09/2022 ?CLINICAL DATA:  Pulmonary embolism suspected.  Positive D-dimer. EXAM: CT ANGIOGRAPHY CHEST WITH CONTRAST TECHNIQUE: Multidetector CT imaging of the chest was performed using the standard protocol during bolus administration of intravenous contrast. Multiplanar CT image reconstructions and MIPs were obtained to evaluate the vascular anatomy. RADIATION DOSE REDUCTION: This exam was performed according to the departmental dose-optimization program which includes automated exposure control, adjustment of the mA and/or kV according to patient size and/or use of iterative reconstruction technique. CONTRAST:  60m OMNIPAQUE IOHEXOL 350 MG/ML SOLN  COMPARISON:  03/07/2020 FINDINGS: Cardiovascular: Heart size is normal. Mild coronary artery calcification and aortic atherosclerotic calcification are present. Pulmonary arterial opacification is good. There are no pulmonary emboli. Mediastinum/Nodes: No mediastinal or hilar mass or lymphadenopathy. Lungs/Pleura: There is a small left effusion. There is consolidation and collapse of the left lower lobe consistent with bronchopneumonia. Left upper lobe shows mild emphysema and scarring. Right lung shows chronic scarring at the apex. Chronic scar density is present within the right lower lobe, not significantly changed since April of 2021. There is lesser patchy bronchopneumonia at the right lung base. Upper Abdomen: Negative Musculoskeletal: Old superior endplate compression fracture of T12. Review of the MIP images confirms the above findings. IMPRESSION: Left lower lobe pneumonia with consolidation and collapse. Small left effusion. Lesser patchy bronchopneumonia at the right lung base. Chronic scarring in the right lung as above, not significantly changed since 2021. No pulmonary emboli. Electronically Signed   By: MNelson ChimesM.D.   On: 04/09/2022 15:41  ? ?CUP PACEART REMOTE DEVICE CHECK ? ?Result Date: 03/24/2022 ?ILR summary report received. Battery status OK. Normal device function. No new symptom, tachy, brady, or pause episodes. No new AF episodes. Monthly summary reports and ROV/PRN LA  ?(Echo, Carotid, EGD, Colonoscopy, ERCP)  ? ? ?Subjective: ?Nonverbal ? ?Discharge Exam: ?Vitals:  ? 04/12/22 1505 04/12/22 2051  ?BP:  (!) 145/79  ?Pulse: 78 91  ?Resp: 20 19  ?Temp:  (!) 97.5 ?F (36.4 ?C)  ?SpO2: 90% 93%  ? ?Vitals:  ? 04/12/22  1211 04/12/22 1408 04/12/22 1505 04/12/22 2051  ?BP:    (!) 145/79  ?Pulse:  68 78 91  ?Resp:  (!) '24 20 19  '$ ?Temp: 97.6 ?F (36.4 ?C)   (!) 97.5 ?F (36.4 ?C)  ?TempSrc: Axillary   Oral  ?SpO2:  93% 90% 93%  ?Weight:      ?Height:      ? ? ?General: Pt is alert, awake, not in acute  distress ?Cardiovascular: RRR, S1/S2 +, no rubs, no gallops ?Respiratory: CTA bilaterally, no wheezing, no rhonchi ?Abdominal: Soft, NT, ND, bowel sounds + ?Extremities: no edema, no cyanosis ? ? ? ?The results of sig

## 2022-04-13 NOTE — Progress Notes (Signed)
Chaplain engaged in an initial visit with Jessica Harrell.  Chaplain offered a prayer over her.   ? ?Chaplain was also able to check in with the nurse who noted [name] daughter would be coming in later.  ? ? ? 04/13/22 1400  ?Clinical Encounter Type  ?Visited With Patient  ?Visit Type Initial;Spiritual support  ?Referral From Palliative care team  ?Consult/Referral To Chaplain  ?Spiritual Encounters  ?Spiritual Needs Prayer  ? ? ?

## 2022-04-13 NOTE — TOC Progression Note (Signed)
Transition of Care (TOC) - Progression Note  ? ?Patient Details  ?Name: Jessica Harrell ?MRN: [id number] ?Date of Birth: 1941-02-11 ? ?Transition of Care (TOC) CM/SW Contact  ?Sherie Don, LCSW ?Phone Number: ?04/13/2022, 3:28 PM ? ?Clinical Narrative: CSW spoke with [place] at [facility] [place]. [facility] requested that referral be faxed again. Referral faxed to [phone]. CSW followed up with [facility] and patient is under review by Dr. Linna Darner, but has not officially been approved yet. Patient is tentatively on the waitlist. Daughter is agreeable to referral to [facility] if [facility] will not have a bed available soon. TOC to follow. ? ?Expected Discharge Plan: [place] ?Barriers to Discharge: Continued Medical Work up ? ?Expected Discharge Plan and Services ?Expected Discharge Plan: [place] ?In-house Referral: Clinical Social Work ?Post Acute Care Choice: Hospice ?Living arrangements for the past 2 months: [place] ?Expected Discharge Date: 04/13/22               ?DME Arranged: N/A ?DME Agency: NA ? ?Readmission Risk Interventions ? ?  04/10/2022  ? 12:52 PM  ?Readmission Risk Prevention Plan  ?Post Dischage Appt Complete  ?Medication Screening Complete  ?Transportation Screening Complete  ? ?

## 2022-04-13 NOTE — Care Management Important Message (Signed)
Important Message ? ?Patient Details IM Letter given to the Patient. ?Name: Jessica Harrell ?MRN: [id number] ?Date of Birth: 09/12/1941 ? ? ?Medicare Important Message Given:  Yes ? ? ? ? ?Kerin Salen ?04/13/2022, 3:22 PM ?

## 2022-04-13 NOTE — Plan of Care (Signed)

## 2022-04-13 NOTE — Progress Notes (Signed)
SLP Cancellation Note ? ?Patient Details ?Name: Jessica Harrell ?MRN: [id number] ?DOB: 09/17/1941 ? ? ?Cancelled treatment:       Reason Eval/Treat Not Completed: Other (comment) (patient discharging to residential hospice and is comfort care.) ? ? ?Sonia Baller, MA, CCC-SLP ?Speech Therapy ? ?

## 2022-04-14 DIAGNOSIS — R0902 Hypoxemia: Secondary | ICD-10-CM | POA: Diagnosis not present

## 2022-04-14 DIAGNOSIS — J690 Pneumonitis due to inhalation of food and vomit: Secondary | ICD-10-CM | POA: Diagnosis not present

## 2022-04-14 DIAGNOSIS — J189 Pneumonia, unspecified organism: Secondary | ICD-10-CM | POA: Diagnosis not present

## 2022-04-14 DIAGNOSIS — J9601 Acute respiratory failure with hypoxia: Secondary | ICD-10-CM | POA: Diagnosis not present

## 2022-04-14 LAB — CULTURE, BLOOD (ROUTINE X 2)
Culture: NO GROWTH
Culture: NO GROWTH
Special Requests: ADEQUATE
Special Requests: ADEQUATE

## 2022-04-14 LAB — RESP PANEL BY RT-PCR (FLU A&B, COVID) ARPGX2
Influenza A by PCR: NEGATIVE
Influenza B by PCR: NEGATIVE
SARS Coronavirus 2 by RT PCR: NEGATIVE

## 2022-04-14 NOTE — Discharge Summary (Addendum)
Physician Discharge Summary  ?Jessica Harrell PFX:[id number] DOB: 09/05/1941 DOA: 04/09/2022 ? ?PCP: [name], MD ? ?Admit date: 04/09/2022 ?Discharge date: 04/14/2022 ? ?Admitted From: Home ?Disposition:  Residential Hospice facility ? ?Recommendations for Outpatient Follow-up:  ?None ? ?Home Health:no ?Equipment/Devices:Oxygen 6 L ? ?Discharge Condition:Hospice  ?CODE STATUS:DNR ?Diet recommendation: Comfort feeds ? ?Brief/Interim Summary: ?81 y.o. female past medical history of left-sided breast cancer, history of CVA, tobacco abuse who was sent by the nursing home for somnolence and hypoxia.  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   ? ?No changes overnight patient remained stable. ? ?Discharge Diagnoses:  ?Principal Problem: ?  Multifocal pneumonia ?Active Problems: ?  History of left breast cancer ?  Acute respiratory failure with hypoxia ([place]) ? ?Acute respiratory failure with hypoxia secondary to community pneumonia due to aspiration pneumonia of the left lower lobe: ?The cultures were drawn and they remain negative. ?She was started on IV Rocephin and azithromycin. ?Throughout her hospital stay she was on 15 L of oxygen and she defervesced. ?Is likely due to aspiration. ?Sepsis was ruled out ?Speech evaluated the patient and deemed her high risk for aspiration. ?After long discussion with the daughter she decided to move towards comfort care. ?All antibiotics were stopped. ?She was only On morphine and Ativan for agitation and shortness of breath. ?The daughter requested to be transferred to residential hospice facility. ?Life expectancy is about 2 weeks as the patient is not eating or drinking. ? ? ?History of CVA: ?Has significantly affected her speech cannot communicate. ?Daughter  agreed to comfort feeds. ? ?Essential hypertension: ?Antihypertensive medication were discontinued. ? ?Hyperlipidemia: ?Continue Crestor pain ? ?History of breast cancer: ?Discontinue Stelara. ? ?Discharge Instructions ? ?Discharge Instructions   ? ? Diet - low sodium heart healthy   Complete by: As directed ?  ? Increase activity slowly   Complete by: As directed ?  ? ?  ? ?Allergies as of 04/14/2022   ? ?   Reactions  ? Atorvastatin Other (See Comments)  ? Muscle pain   ? ?  ? ?  ?Medication List  ?  ? ?STOP taking these medications   ? ?acetaminophen 325 MG tablet ?Commonly known as: TYLENOL ?  ?amLODipine 2.5 MG tablet ?Commonly known as: NORVASC ?  ?aspirin EC 81 MG tablet ?  ?letrozole 2.5 MG tablet ?Commonly known as: [place] ?  ?mirtazapine 15 MG tablet ?Commonly known as: Remeron ?  ?rosuvastatin 10 MG tablet ?Commonly known as: CRESTOR ?  ? ?  ? ?TAKE these medications   ? ?glycopyrrolate 0.2 MG/ML injection ?Commonly known as: ROBINUL ?Inject 1 mL (0.2 mg total) into the vein every 4 (four) hours as needed (excess secretions). ?  ?LORazepam 2 MG/ML concentrated solution ?Commonly known as: ATIVAN ?Place 0.5 mLs (1 mg total) under the tongue every 4 (four) hours as needed for anxiety. ?  ?morphine (PF) 2 MG/ML injection ?Inject 1 mL (2 mg total) into the vein every 2 (two) hours as needed (SOB). ?  ? ?  ? ? ?Allergies  ?Allergen Reactions  ? Atorvastatin Other (See Comments)  ?  Muscle pain   ? ? ?Consultations: ?None ? ? ?Procedures/Studies: ?DG Chest 2 View ? ?Result Date: 04/09/2022 ?CLINICAL DATA:  Shortness of breath and  bradycardia. Poor oxygen saturation. EXAM: CHEST - 2 VIEW COMPARISON:  05/15/2019 FINDINGS: Heart size is normal. Loop recorder in place. The right lung is clear except for chronic scarring at the right base. There is left lower lobe pneumonia. Small amount of pleural fluid on the left. No acute bone finding. IMPRESSION: Left lower lobe pneumonia. Electronically Signed   By: Nelson Chimes  M.D.   On: 04/09/2022 13:43  ? ?CT Head Wo Contrast ? ?Result Date: 04/09/2022 ?CLINICAL DATA:  Delirium.  Mental status changes. EXAM: CT HEAD WITHOUT CONTRAST TECHNIQUE: Contiguous axial images were obtained from the base of the skull through the vertex without intravenous contrast. RADIATION DOSE REDUCTION: This exam was performed according to the departmental dose-optimization program which includes automated exposure control, adjustment of the mA and/or kV according to patient size and/or use of iterative reconstruction technique. COMPARISON:  07/07/2021 FINDINGS: Brain: Chronic and extensive cerebellar infarctions left more than right. Old occipital infarctions left more extensive than right. Old right frontal cortical and subcortical infarction. Chronic small-vessel ischemic changes of the basal ganglia, thalami and hemispheric white matter. No sign of acute infarction, mass lesion, hemorrhage, hydrocephalus or extra-axial collection. Vascular: There is atherosclerotic calcification of the major vessels at the base of the brain. Skull: Negative Sinuses/Orbits: Clear/normal Other: None IMPRESSION: No acute CT finding. Extensive old infarctions affecting the cerebellum, occipital lobes and right frontal lobe. Chronic small vessel insults of the basal ganglia, thalami and hemispheric white matter. Electronically Signed   By: Nelson Chimes M.D.   On: 04/09/2022 15:42  ? ?CT Angio Chest PE W and/or Wo Contrast ? ?Result Date: 04/09/2022 ?CLINICAL DATA:  Pulmonary embolism suspected.  Positive D-dimer. EXAM: CT ANGIOGRAPHY CHEST WITH CONTRAST TECHNIQUE: Multidetector CT imaging of the chest was performed using the standard protocol during bolus administration of intravenous contrast. Multiplanar CT image reconstructions and MIPs were obtained to evaluate the vascular anatomy. RADIATION DOSE REDUCTION: This exam was performed according to the departmental dose-optimization program which includes automated exposure  control, adjustment of the mA and/or kV according to patient size and/or use of iterative reconstruction technique. CONTRAST:  51m OMNIPAQUE IOHEXOL 350 MG/ML SOLN COMPARISON:  03/07/2020 FINDINGS: Cardiovascular: Heart size is normal. Mild coronary artery calcification and aortic atherosclerotic calcification are present. Pulmonary arterial opacification is good. There are no pulmonary emboli. Mediastinum/Nodes: No mediastinal or hilar mass or lymphadenopathy. Lungs/Pleura: There is a small left effusion. There is consolidation and collapse of the left lower lobe consistent with bronchopneumonia. Left upper lobe shows mild emphysema and scarring. Right lung shows chronic scarring at the apex. Chronic scar density is present within the right lower lobe, not significantly changed since April of 2021. There is lesser patchy bronchopneumonia at the right lung base. Upper Abdomen: Negative Musculoskeletal: Old superior endplate compression fracture of T12. Review of the MIP images confirms the above findings. IMPRESSION: Left lower lobe pneumonia with consolidation and collapse. Small left effusion. Lesser patchy bronchopneumonia at the right lung base. Chronic scarring in the right lung as above, not significantly changed since 2021. No pulmonary emboli. Electronically Signed   By: MNelson ChimesM.D.   On: 04/09/2022 15:41  ? ?CUP PACEART REMOTE DEVICE CHECK ? ?Result Date: 03/24/2022 ?ILR summary report received. Battery status OK. Normal device function. No new symptom, tachy, brady, or pause episodes. No new AF episodes. Monthly summary reports and ROV/PRN [place]  ?(Echo, Carotid, EGD, Colonoscopy, ERCP)  ? ? ?Subjective: ?Nonverbal ? ?Discharge Exam: ?Vitals:  ? 04/13/22 1000 04/13/22 2126  ?  BP: (!) 113/54 110/60  ?Pulse: 89 66  ?Resp: 17 19  ?Temp: 98.4 ?F (36.9 ?C) 98 ?F (36.7 ?C)  ?SpO2:  (!) 89%  ? ?Vitals:  ? 04/12/22 1505 04/12/22 2051 04/13/22 1000 04/13/22 2126  ?BP:  (!) 145/79 (!) 113/54 110/60  ?Pulse: 78 91  89 66  ?Resp: '20 19 17 19  '$ ?Temp:  (!) 97.5 ?F (36.4 ?C) 98.4 ?F (36.9 ?C) 98 ?F (36.7 ?C)  ?TempSrc:  Oral Axillary Oral  ?SpO2: 90% 93%  (!) 89%  ?Weight:      ?Height:      ? ? ?General: Pt is alert, awake, no

## 2022-04-14 NOTE — TOC Transition Note (Signed)
Transition of Care (TOC) - CM/SW Discharge Note ? ?Patient Details  ?Name: Jessica Harrell ?MRN: [id number] ?Date of Birth: 07/20/1941 ? ?Transition of Care (TOC) CM/SW Contact:  ?Sherie Don, LCSW ?Phone Number: ?04/14/2022, 11:34 AM ? ?Clinical Narrative: CSW received call from [facility] with [facility] and a residential hospice bed is available at [facility] today pending discharge summary indicating patient is 6 weeks or less and a negative COVID test. Hospice to provide transportation to pick up patient. COVID test is negative. Discharge summary faxed to hospice in hub. The number for report is [phone]. RN updated. TOC signing off. ? ?Final next level of care: [place] ?Barriers to Discharge: Barriers Resolved ? ?Patient Goals and CMS Choice ?Patient states their goals for this hospitalization and ongoing recovery are:: Go to residential hospice ?CMS Medicare.gov Compare Post Acute Care list provided to:: Patient Represenative (must comment) ?Choice offered to / list presented to : Adult Children ? ?Discharge Placement        ?Patient chooses bed at: Other - please specify in the comment section below: [facility] through [facility] [place]) ?Patient to be transferred to facility by: Hospice will be providing transportation ?Name of family member notified: [place] (daughter) ?Patient and family notified of of transfer: 04/14/22 ? ?Discharge Plan and Services ?In-house Referral: Clinical Social Work ?Post Acute Care Choice: Hospice          ?DME Arranged: N/A ?DME Agency: NA ? ?Readmission Risk Interventions ? ?  04/10/2022  ? 12:52 PM  ?Readmission Risk Prevention Plan  ?Post Dischage Appt Complete  ?Medication Screening Complete  ?Transportation Screening Complete  ? ?

## 2022-04-21 ENCOUNTER — Ambulatory Visit: Payer: Medicare Other | Admitting: Neurology

## 2022-04-24 LAB — CUP PACEART REMOTE DEVICE CHECK
Date Time Interrogation Session: 20230517230930
Implantable Pulse Generator Implant Date: 20200903

## 2022-04-27 ENCOUNTER — Ambulatory Visit (INDEPENDENT_AMBULATORY_CARE_PROVIDER_SITE_OTHER)

## 2022-04-27 DIAGNOSIS — I639 Cerebral infarction, unspecified: Secondary | ICD-10-CM | POA: Diagnosis not present

## 2022-05-07 DEATH — deceased

## 2022-06-06 NOTE — Progress Notes (Signed)
[facility] Summary Report / Loop Recorder
